# Patient Record
Sex: Female | Born: 1980
Health system: Southern US, Community
[De-identification: ages and names within clinical notes are randomized; demographics above are authoritative.]

## PROBLEM LIST (undated history)

## (undated) DIAGNOSIS — G43909 Migraine, unspecified, not intractable, without status migrainosus: Secondary | ICD-10-CM

## (undated) DIAGNOSIS — N83201 Unspecified ovarian cyst, right side: Secondary | ICD-10-CM

## (undated) DIAGNOSIS — E041 Nontoxic single thyroid nodule: Secondary | ICD-10-CM

## (undated) DIAGNOSIS — K219 Gastro-esophageal reflux disease without esophagitis: Secondary | ICD-10-CM

## (undated) DIAGNOSIS — E669 Obesity, unspecified: Secondary | ICD-10-CM

## (undated) DIAGNOSIS — K560 Paralytic ileus: Secondary | ICD-10-CM

## (undated) DIAGNOSIS — K209 Esophagitis, unspecified without bleeding: Secondary | ICD-10-CM

## (undated) DIAGNOSIS — N390 Urinary tract infection, site not specified: Secondary | ICD-10-CM

## (undated) DIAGNOSIS — F32A Depression, unspecified: Secondary | ICD-10-CM

## (undated) DIAGNOSIS — I499 Cardiac arrhythmia, unspecified: Secondary | ICD-10-CM

## (undated) DIAGNOSIS — F419 Anxiety disorder, unspecified: Secondary | ICD-10-CM

## (undated) DIAGNOSIS — E119 Type 2 diabetes mellitus without complications: Secondary | ICD-10-CM

## (undated) DIAGNOSIS — N200 Calculus of kidney: Secondary | ICD-10-CM

## (undated) DIAGNOSIS — Z765 Malingerer [conscious simulation]: Secondary | ICD-10-CM

## (undated) DIAGNOSIS — T4145XA Adverse effect of unspecified anesthetic, initial encounter: Secondary | ICD-10-CM

## (undated) DIAGNOSIS — K2970 Gastritis, unspecified, without bleeding: Secondary | ICD-10-CM

## (undated) DIAGNOSIS — K56609 Unspecified intestinal obstruction, unspecified as to partial versus complete obstruction: Secondary | ICD-10-CM

## (undated) DIAGNOSIS — T8859XA Other complications of anesthesia, initial encounter: Secondary | ICD-10-CM

## (undated) DIAGNOSIS — H44009 Unspecified purulent endophthalmitis, unspecified eye: Secondary | ICD-10-CM

## (undated) DIAGNOSIS — Z789 Other specified health status: Secondary | ICD-10-CM

## (undated) DIAGNOSIS — F329 Major depressive disorder, single episode, unspecified: Secondary | ICD-10-CM

## (undated) DIAGNOSIS — J189 Pneumonia, unspecified organism: Secondary | ICD-10-CM

## (undated) HISTORY — DX: Esophagitis, unspecified without bleeding: K20.90

## (undated) HISTORY — PX: PILONIDAL CYST EXCISION: SHX744

## (undated) HISTORY — PX: OTHER SURGICAL HISTORY: SHX169

## (undated) HISTORY — DX: Esophagitis, unspecified: K20.9

## (undated) HISTORY — PX: KNEE ARTHROSCOPY: SUR90

## (undated) HISTORY — DX: Calculus of kidney: N20.0

## (undated) HISTORY — DX: Other specified health status: Z78.9

## (undated) HISTORY — PX: COLON SURGERY: SHX602

## (undated) HISTORY — PX: LAPAROSCOPIC GASTRIC BANDING: SHX1100

## (undated) HISTORY — PX: UMBILICAL HERNIA REPAIR: SHX196

## (undated) HISTORY — PX: UPPER GASTROINTESTINAL ENDOSCOPY: SHX188

## (undated) HISTORY — DX: Anxiety disorder, unspecified: F41.9

---
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
## 2001-05-08 HISTORY — PX: CHOLECYSTECTOMY: SHX55

## 2004-03-25 ENCOUNTER — Ambulatory Visit: Payer: Self-pay | Admitting: General Surgery

## 2004-04-11 ENCOUNTER — Ambulatory Visit: Payer: Self-pay | Admitting: Internal Medicine

## 2004-04-13 ENCOUNTER — Observation Stay: Payer: Self-pay | Admitting: Specialist

## 2004-05-08 HISTORY — PX: ABDOMINAL ADHESION SURGERY: SHX90

## 2004-05-08 HISTORY — PX: OTHER SURGICAL HISTORY: SHX169

## 2004-05-27 ENCOUNTER — Emergency Department: Payer: Self-pay | Admitting: Emergency Medicine

## 2004-11-21 ENCOUNTER — Inpatient Hospital Stay: Payer: Self-pay | Admitting: Internal Medicine

## 2005-02-07 ENCOUNTER — Inpatient Hospital Stay: Payer: Self-pay | Admitting: Gastroenterology

## 2005-02-13 ENCOUNTER — Inpatient Hospital Stay: Payer: Self-pay | Admitting: Obstetrics and Gynecology

## 2005-03-10 ENCOUNTER — Observation Stay: Payer: Self-pay | Admitting: Obstetrics and Gynecology

## 2005-03-31 ENCOUNTER — Emergency Department: Payer: Self-pay | Admitting: Emergency Medicine

## 2005-07-11 ENCOUNTER — Emergency Department: Payer: Self-pay | Admitting: Emergency Medicine

## 2006-01-03 ENCOUNTER — Other Ambulatory Visit: Payer: Self-pay

## 2006-01-04 ENCOUNTER — Inpatient Hospital Stay: Payer: Self-pay | Admitting: Internal Medicine

## 2006-01-23 ENCOUNTER — Emergency Department: Payer: Self-pay | Admitting: Emergency Medicine

## 2006-01-30 ENCOUNTER — Ambulatory Visit: Payer: Self-pay | Admitting: Surgery

## 2006-05-14 ENCOUNTER — Ambulatory Visit: Payer: Self-pay

## 2006-05-29 ENCOUNTER — Ambulatory Visit: Payer: Self-pay | Admitting: Specialist

## 2006-05-30 ENCOUNTER — Emergency Department: Payer: Self-pay | Admitting: Emergency Medicine

## 2006-06-08 ENCOUNTER — Ambulatory Visit: Payer: Self-pay

## 2007-07-24 ENCOUNTER — Observation Stay (HOSPITAL_COMMUNITY): Admission: AD | Admit: 2007-07-24 | Discharge: 2007-07-26 | Payer: Self-pay | Admitting: Obstetrics and Gynecology

## 2007-08-01 ENCOUNTER — Ambulatory Visit (HOSPITAL_COMMUNITY): Admission: RE | Admit: 2007-08-01 | Discharge: 2007-08-01 | Payer: Self-pay | Admitting: Obstetrics and Gynecology

## 2007-11-18 ENCOUNTER — Other Ambulatory Visit: Payer: Self-pay

## 2007-11-18 ENCOUNTER — Emergency Department: Payer: Self-pay | Admitting: Emergency Medicine

## 2007-12-10 ENCOUNTER — Emergency Department: Payer: Self-pay | Admitting: Internal Medicine

## 2008-01-14 ENCOUNTER — Other Ambulatory Visit: Payer: Self-pay

## 2008-01-14 ENCOUNTER — Inpatient Hospital Stay: Payer: Self-pay | Admitting: Internal Medicine

## 2008-10-26 ENCOUNTER — Emergency Department: Payer: Self-pay | Admitting: Unknown Physician Specialty

## 2008-10-26 ENCOUNTER — Emergency Department: Payer: Self-pay | Admitting: Emergency Medicine

## 2008-10-27 ENCOUNTER — Emergency Department: Payer: Self-pay | Admitting: Emergency Medicine

## 2008-12-29 ENCOUNTER — Emergency Department: Payer: Self-pay | Admitting: Unknown Physician Specialty

## 2008-12-31 ENCOUNTER — Inpatient Hospital Stay: Payer: Self-pay | Admitting: Internal Medicine

## 2009-03-15 ENCOUNTER — Emergency Department: Payer: Self-pay | Admitting: Emergency Medicine

## 2009-04-12 ENCOUNTER — Emergency Department: Payer: Self-pay | Admitting: Emergency Medicine

## 2009-04-16 ENCOUNTER — Inpatient Hospital Stay: Payer: Self-pay | Admitting: Internal Medicine

## 2009-05-31 ENCOUNTER — Encounter: Admission: RE | Admit: 2009-05-31 | Discharge: 2009-05-31 | Payer: Self-pay | Admitting: Family Medicine

## 2009-06-03 ENCOUNTER — Emergency Department (HOSPITAL_COMMUNITY): Admission: EM | Admit: 2009-06-03 | Discharge: 2009-06-03 | Payer: Self-pay | Admitting: Emergency Medicine

## 2009-07-12 ENCOUNTER — Observation Stay (HOSPITAL_COMMUNITY): Admission: EM | Admit: 2009-07-12 | Discharge: 2009-07-15 | Payer: Self-pay | Admitting: Emergency Medicine

## 2009-12-23 ENCOUNTER — Emergency Department (HOSPITAL_COMMUNITY): Admission: EM | Admit: 2009-12-23 | Discharge: 2009-12-23 | Payer: Self-pay | Admitting: Emergency Medicine

## 2010-01-25 ENCOUNTER — Emergency Department (HOSPITAL_COMMUNITY): Admission: EM | Admit: 2010-01-25 | Discharge: 2010-01-25 | Payer: Self-pay | Admitting: Emergency Medicine

## 2010-02-02 ENCOUNTER — Emergency Department (HOSPITAL_COMMUNITY): Admission: EM | Admit: 2010-02-02 | Discharge: 2010-02-02 | Payer: Self-pay | Admitting: Emergency Medicine

## 2010-04-23 ENCOUNTER — Emergency Department: Payer: Self-pay | Admitting: Emergency Medicine

## 2010-04-25 ENCOUNTER — Emergency Department: Payer: Self-pay | Admitting: Emergency Medicine

## 2010-05-18 ENCOUNTER — Emergency Department: Payer: Self-pay | Admitting: Emergency Medicine

## 2010-07-04 ENCOUNTER — Ambulatory Visit (INDEPENDENT_AMBULATORY_CARE_PROVIDER_SITE_OTHER): Payer: Self-pay | Admitting: Internal Medicine

## 2010-07-21 LAB — URINALYSIS, ROUTINE W REFLEX MICROSCOPIC
Ketones, ur: NEGATIVE mg/dL
Nitrite: NEGATIVE
Protein, ur: NEGATIVE mg/dL
Urobilinogen, UA: 1 mg/dL (ref 0.0–1.0)
pH: 6.5 (ref 5.0–8.0)

## 2010-07-21 LAB — DIFFERENTIAL
Basophils Absolute: 0.1 10*3/uL (ref 0.0–0.1)
Basophils Relative: 1 % (ref 0–1)
Eosinophils Absolute: 0.2 10*3/uL (ref 0.0–0.7)
Eosinophils Relative: 2 % (ref 0–5)
Monocytes Absolute: 0.7 10*3/uL (ref 0.1–1.0)
Monocytes Relative: 7 % (ref 3–12)
Neutrophils Relative %: 70 % (ref 43–77)

## 2010-07-21 LAB — COMPREHENSIVE METABOLIC PANEL
ALT: 27 U/L (ref 0–35)
BUN: 8 mg/dL (ref 6–23)
Chloride: 107 mEq/L (ref 96–112)
Creatinine, Ser: 0.69 mg/dL (ref 0.4–1.2)
GFR calc non Af Amer: >60 mL/min (ref >60)
Glucose, Bld: 169 mg/dL — ABNORMAL HIGH (ref 70–99)
Potassium: 3.5 mEq/L (ref 3.5–5.1)

## 2010-07-21 LAB — CBC
HCT: 41.8 % (ref 36.0–46.0)
MCH: 32.3 pg (ref 26.0–34.0)
MCV: 93.4 fL (ref 78.0–100.0)
Platelets: 394 10*3/uL (ref 150–400)
RBC: 4.47 MIL/uL (ref 3.87–5.11)
RDW: 13.4 % (ref 11.5–15.5)
WBC: 10.6 10*3/uL — ABNORMAL HIGH (ref 4.0–10.5)

## 2010-07-21 LAB — URINE MICROSCOPIC-ADD ON

## 2010-07-22 LAB — URINE MICROSCOPIC-ADD ON

## 2010-07-22 LAB — URINALYSIS, ROUTINE W REFLEX MICROSCOPIC
Hgb urine dipstick: NEGATIVE
Specific Gravity, Urine: 1.034 — ABNORMAL HIGH (ref 1.005–1.030)

## 2010-07-22 LAB — LITHIUM LEVEL: Lithium Lvl: 0.29 mEq/L — ABNORMAL LOW (ref 0.80–1.40)

## 2010-07-22 LAB — POCT I-STAT, CHEM 8
BUN: 15 mg/dL (ref 6–23)
HCT: 43 % (ref 36.0–46.0)
Hemoglobin: 14.6 g/dL (ref 12.0–15.0)
Sodium: 140 mEq/L (ref 135–145)
TCO2: 23 mmol/L (ref 0–100)

## 2010-07-22 LAB — CBC
HCT: 41.2 % (ref 36.0–46.0)
Hemoglobin: 14.4 g/dL (ref 12.0–15.0)
MCH: 32 pg (ref 26.0–34.0)
MCV: 91.6 fL (ref 78.0–100.0)
RBC: 4.5 MIL/uL (ref 3.87–5.11)
WBC: 11.6 10*3/uL — ABNORMAL HIGH (ref 4.0–10.5)

## 2010-07-22 LAB — HEPATIC FUNCTION PANEL
AST: 20 U/L (ref 0–37)
Albumin: 3.9 g/dL (ref 3.5–5.2)
Alkaline Phosphatase: 77 U/L (ref 39–117)
Bilirubin, Direct: 0.1 mg/dL (ref 0.0–0.3)
Indirect Bilirubin: 0.8 mg/dL (ref 0.3–0.9)
Total Protein: 7 g/dL (ref 6.0–8.3)

## 2010-07-22 LAB — DIFFERENTIAL
Basophils Absolute: 0 10*3/uL (ref 0.0–0.1)
Eosinophils Absolute: 0.2 10*3/uL (ref 0.0–0.7)
Eosinophils Relative: 2 % (ref 0–5)
Lymphs Abs: 2.1 10*3/uL (ref 0.7–4.0)
Monocytes Absolute: 0.7 10*3/uL (ref 0.1–1.0)
Neutrophils Relative %: 74 % (ref 43–77)

## 2010-07-22 LAB — POCT PREGNANCY, URINE: Preg Test, Ur: NEGATIVE

## 2010-07-25 LAB — URINE CULTURE

## 2010-07-25 LAB — URINALYSIS, ROUTINE W REFLEX MICROSCOPIC
Bilirubin Urine: NEGATIVE
Specific Gravity, Urine: 1.015 (ref 1.005–1.030)
Urobilinogen, UA: 0.2 mg/dL (ref 0.0–1.0)

## 2010-07-25 LAB — URINE MICROSCOPIC-ADD ON

## 2010-07-25 LAB — POCT PREGNANCY, URINE: Preg Test, Ur: NEGATIVE

## 2010-08-01 LAB — URINE MICROSCOPIC-ADD ON

## 2010-08-01 LAB — BASIC METABOLIC PANEL
BUN: 4 mg/dL — ABNORMAL LOW (ref 6–23)
CO2: 26 mEq/L (ref 19–32)
Calcium: 8.5 mg/dL (ref 8.4–10.5)
Chloride: 107 mEq/L (ref 96–112)
Creatinine, Ser: 0.53 mg/dL (ref 0.4–1.2)
GFR calc Af Amer: >60 mL/min (ref >60)
Glucose, Bld: 114 mg/dL — ABNORMAL HIGH (ref 70–99)

## 2010-08-01 LAB — URINALYSIS, ROUTINE W REFLEX MICROSCOPIC
Glucose, UA: NEGATIVE mg/dL
Hgb urine dipstick: NEGATIVE
Specific Gravity, Urine: 1.021 (ref 1.005–1.030)
pH: 8 (ref 5.0–8.0)

## 2010-08-01 LAB — COMPREHENSIVE METABOLIC PANEL
ALT: 28 U/L (ref 0–35)
AST: 20 U/L (ref 0–37)
Albumin: 3.1 g/dL — ABNORMAL LOW (ref 3.5–5.2)
Albumin: 3.3 g/dL — ABNORMAL LOW (ref 3.5–5.2)
Albumin: 3.6 g/dL (ref 3.5–5.2)
Alkaline Phosphatase: 67 U/L (ref 39–117)
Alkaline Phosphatase: 73 U/L (ref 39–117)
Alkaline Phosphatase: 78 U/L (ref 39–117)
BUN: 2 mg/dL — ABNORMAL LOW (ref 6–23)
BUN: 4 mg/dL — ABNORMAL LOW (ref 6–23)
Calcium: 8.2 mg/dL — ABNORMAL LOW (ref 8.4–10.5)
Calcium: 8.8 mg/dL (ref 8.4–10.5)
Creatinine, Ser: 0.57 mg/dL (ref 0.4–1.2)
Creatinine, Ser: 0.57 mg/dL (ref 0.4–1.2)
GFR calc Af Amer: >60 mL/min (ref >60)
Glucose, Bld: 120 mg/dL — ABNORMAL HIGH (ref 70–99)
Potassium: 3.6 mEq/L (ref 3.5–5.1)
Potassium: 3.8 mEq/L (ref 3.5–5.1)
Potassium: 3.8 mEq/L (ref 3.5–5.1)
Sodium: 138 mEq/L (ref 135–145)
Total Protein: 5.9 g/dL — ABNORMAL LOW (ref 6.0–8.3)
Total Protein: 6.1 g/dL (ref 6.0–8.3)
Total Protein: 6.6 g/dL (ref 6.0–8.3)

## 2010-08-01 LAB — DIFFERENTIAL
Basophils Absolute: 0 10*3/uL (ref 0.0–0.1)
Basophils Relative: 0 % (ref 0–1)
Basophils Relative: 1 % (ref 0–1)
Eosinophils Absolute: 0.3 10*3/uL (ref 0.0–0.7)
Eosinophils Relative: 2 % (ref 0–5)
Lymphs Abs: 2.8 10*3/uL (ref 0.7–4.0)
Monocytes Absolute: 0.7 10*3/uL (ref 0.1–1.0)
Monocytes Absolute: 0.9 10*3/uL (ref 0.1–1.0)
Monocytes Relative: 9 % (ref 3–12)
Neutro Abs: 5.8 10*3/uL (ref 1.7–7.7)
Neutro Abs: 8.6 10*3/uL — ABNORMAL HIGH (ref 1.7–7.7)

## 2010-08-01 LAB — WET PREP, GENITAL
Clue Cells Wet Prep HPF POC: NONE SEEN
Trich, Wet Prep: NONE SEEN

## 2010-08-01 LAB — POCT PREGNANCY, URINE: Preg Test, Ur: NEGATIVE

## 2010-08-01 LAB — CBC
HCT: 36.1 % (ref 36.0–46.0)
HCT: 37.7 % (ref 36.0–46.0)
Hemoglobin: 12.3 g/dL (ref 12.0–15.0)
MCHC: 34.2 g/dL (ref 30.0–36.0)
MCHC: 34.7 g/dL (ref 30.0–36.0)
MCV: 93.1 fL (ref 78.0–100.0)
Platelets: 257 10*3/uL (ref 150–400)
Platelets: 319 10*3/uL (ref 150–400)
Platelets: 345 10*3/uL (ref 150–400)
RDW: 13.1 % (ref 11.5–15.5)
RDW: 13.2 % (ref 11.5–15.5)
RDW: 13.4 % (ref 11.5–15.5)
WBC: 11.2 10*3/uL — ABNORMAL HIGH (ref 4.0–10.5)

## 2010-08-01 LAB — HEMOGLOBIN AND HEMATOCRIT, BLOOD
HCT: 37.5 % (ref 36.0–46.0)
HCT: 38.6 % (ref 36.0–46.0)
HCT: 42 % (ref 36.0–46.0)
Hemoglobin: 13.1 g/dL (ref 12.0–15.0)

## 2010-08-01 LAB — GC/CHLAMYDIA PROBE AMP, GENITAL
Chlamydia, DNA Probe: NEGATIVE
GC Probe Amp, Genital: NEGATIVE

## 2010-08-01 LAB — LIPASE, BLOOD: Lipase: 18 U/L (ref 11–59)

## 2010-09-08 ENCOUNTER — Emergency Department (HOSPITAL_COMMUNITY)
Admission: EM | Admit: 2010-09-08 | Discharge: 2010-09-08 | Disposition: A | Discharge status: Home / Self Care | Payer: BC Managed Care – PPO | Attending: Emergency Medicine | Admitting: Emergency Medicine

## 2010-09-08 ENCOUNTER — Encounter (HOSPITAL_COMMUNITY): Payer: Self-pay | Admitting: Radiology

## 2010-09-08 ENCOUNTER — Emergency Department (HOSPITAL_COMMUNITY): Payer: BC Managed Care – PPO

## 2010-09-08 DIAGNOSIS — R109 Unspecified abdominal pain: Secondary | ICD-10-CM | POA: Insufficient documentation

## 2010-09-08 DIAGNOSIS — N949 Unspecified condition associated with female genital organs and menstrual cycle: Secondary | ICD-10-CM | POA: Insufficient documentation

## 2010-09-08 DIAGNOSIS — Z9089 Acquired absence of other organs: Secondary | ICD-10-CM | POA: Insufficient documentation

## 2010-09-08 DIAGNOSIS — Z87442 Personal history of urinary calculi: Secondary | ICD-10-CM | POA: Insufficient documentation

## 2010-09-08 DIAGNOSIS — N938 Other specified abnormal uterine and vaginal bleeding: Secondary | ICD-10-CM | POA: Insufficient documentation

## 2010-09-08 DIAGNOSIS — N83209 Unspecified ovarian cyst, unspecified side: Secondary | ICD-10-CM | POA: Insufficient documentation

## 2010-09-08 DIAGNOSIS — R112 Nausea with vomiting, unspecified: Secondary | ICD-10-CM | POA: Insufficient documentation

## 2010-09-08 LAB — URINALYSIS, ROUTINE W REFLEX MICROSCOPIC
Bilirubin Urine: NEGATIVE
Glucose, UA: NEGATIVE mg/dL
Specific Gravity, Urine: 1.024 (ref 1.005–1.030)
pH: 6 (ref 5.0–8.0)

## 2010-09-08 LAB — LIPASE, BLOOD: Lipase: 28 U/L (ref 11–59)

## 2010-09-08 LAB — CBC
Hemoglobin: 14.4 g/dL (ref 12.0–15.0)
MCHC: 34.6 g/dL (ref 30.0–36.0)
Platelets: 367 10*3/uL (ref 150–400)
RBC: 4.65 MIL/uL (ref 3.87–5.11)

## 2010-09-08 LAB — WET PREP, GENITAL
Trich, Wet Prep: NONE SEEN
Yeast Wet Prep HPF POC: NONE SEEN

## 2010-09-08 LAB — URINE MICROSCOPIC-ADD ON

## 2010-09-08 LAB — DIFFERENTIAL
Basophils Absolute: 0 10*3/uL (ref 0.0–0.1)
Basophils Relative: 0 % (ref 0–1)
Neutro Abs: 6 10*3/uL (ref 1.7–7.7)
Neutrophils Relative %: 70 % (ref 43–77)

## 2010-09-08 MED ORDER — IOHEXOL 300 MG/ML  SOLN
100.0000 mL | Freq: Once | INTRAMUSCULAR | Status: AC | PRN
  Administered 2010-09-08: 100 mL via INTRAVENOUS

## 2010-09-09 LAB — POCT I-STAT, CHEM 8
BUN: 8 mg/dL (ref 6–23)
Creatinine, Ser: 0.7 mg/dL (ref 0.4–1.2)
Glucose, Bld: 167 mg/dL — ABNORMAL HIGH (ref 70–99)
Hemoglobin: 15 g/dL (ref 12.0–15.0)
Potassium: 3.3 mEq/L — ABNORMAL LOW (ref 3.5–5.1)

## 2010-09-15 ENCOUNTER — Inpatient Hospital Stay (HOSPITAL_COMMUNITY)
Admission: AD | Admit: 2010-09-15 | Discharge: 2010-09-18 | DRG: 189 | Disposition: A | Discharge status: Home / Self Care | Payer: BC Managed Care – PPO | Source: Other Acute Inpatient Hospital | Attending: Internal Medicine | Admitting: Internal Medicine

## 2010-09-15 ENCOUNTER — Inpatient Hospital Stay (HOSPITAL_COMMUNITY)
Admission: AD | Admit: 2010-09-15 | Discharge: 2010-09-15 | Disposition: A | Discharge status: Skilled Nursing Facility | Payer: BC Managed Care – PPO | Source: Ambulatory Visit | Attending: Obstetrics and Gynecology | Admitting: Obstetrics and Gynecology

## 2010-09-15 DIAGNOSIS — F341 Dysthymic disorder: Secondary | ICD-10-CM | POA: Diagnosis present

## 2010-09-15 DIAGNOSIS — R109 Unspecified abdominal pain: Secondary | ICD-10-CM | POA: Insufficient documentation

## 2010-09-15 DIAGNOSIS — N809 Endometriosis, unspecified: Secondary | ICD-10-CM | POA: Diagnosis present

## 2010-09-15 DIAGNOSIS — N949 Unspecified condition associated with female genital organs and menstrual cycle: Secondary | ICD-10-CM | POA: Diagnosis present

## 2010-09-15 DIAGNOSIS — K660 Peritoneal adhesions (postprocedural) (postinfection): Principal | ICD-10-CM | POA: Diagnosis present

## 2010-09-15 LAB — URINALYSIS, ROUTINE W REFLEX MICROSCOPIC
Glucose, UA: NEGATIVE mg/dL
Hgb urine dipstick: NEGATIVE
Protein, ur: NEGATIVE mg/dL
Specific Gravity, Urine: >1.03 — ABNORMAL HIGH (ref 1.005–1.030)
pH: 5.5 (ref 5.0–8.0)

## 2010-09-15 LAB — COMPREHENSIVE METABOLIC PANEL
AST: 37 U/L (ref 0–37)
Albumin: 3.4 g/dL — ABNORMAL LOW (ref 3.5–5.2)
Alkaline Phosphatase: 81 U/L (ref 39–117)
BUN: 9 mg/dL (ref 6–23)
Chloride: 101 mEq/L (ref 96–112)
GFR calc Af Amer: >60 mL/min (ref >60)
Potassium: 4.2 mEq/L (ref 3.5–5.1)
Total Bilirubin: 0.3 mg/dL (ref 0.3–1.2)
Total Protein: 6.4 g/dL (ref 6.0–8.3)

## 2010-09-15 LAB — DIFFERENTIAL
Basophils Absolute: 0.1 10*3/uL (ref 0.0–0.1)
Basophils Relative: 1 % (ref 0–1)
Eosinophils Absolute: 0.2 10*3/uL (ref 0.0–0.7)
Eosinophils Relative: 3 % (ref 0–5)
Neutrophils Relative %: 55 % (ref 43–77)

## 2010-09-15 LAB — CBC
Platelets: 310 10*3/uL (ref 150–400)
RBC: 4.47 MIL/uL (ref 3.87–5.11)
RDW: 13.1 % (ref 11.5–15.5)
WBC: 7.7 10*3/uL (ref 4.0–10.5)

## 2010-09-15 LAB — URINE MICROSCOPIC-ADD ON

## 2010-09-16 LAB — CBC
HCT: 36.4 % (ref 36.0–46.0)
MCH: 31.2 pg (ref 26.0–34.0)
MCV: 89.4 fL (ref 78.0–100.0)
Platelets: 291 10*3/uL (ref 150–400)
RBC: 4.07 MIL/uL (ref 3.87–5.11)
WBC: 5.9 10*3/uL (ref 4.0–10.5)

## 2010-09-16 LAB — URINALYSIS, ROUTINE W REFLEX MICROSCOPIC
Bilirubin Urine: NEGATIVE
Glucose, UA: NEGATIVE mg/dL
Hgb urine dipstick: NEGATIVE
Protein, ur: NEGATIVE mg/dL
Specific Gravity, Urine: 1.009 (ref 1.005–1.030)
Urobilinogen, UA: 1 mg/dL (ref 0.0–1.0)

## 2010-09-16 LAB — URINE CULTURE
Colony Count: NO GROWTH
Culture: NO GROWTH

## 2010-09-16 LAB — BASIC METABOLIC PANEL
BUN: 7 mg/dL (ref 6–23)
CO2: 26 mEq/L (ref 19–32)
Chloride: 105 mEq/L (ref 96–112)
Creatinine, Ser: 0.49 mg/dL (ref 0.4–1.2)
Glucose, Bld: 107 mg/dL — ABNORMAL HIGH (ref 70–99)
Potassium: 3.8 mEq/L (ref 3.5–5.1)

## 2010-09-20 NOTE — Op Note (Signed)
NAME:  [name], [name] NO.:  [id number]   MEDICAL RECORD NO.:  [id number]          PATIENT TYPE:  AMB   LOCATION:  SDC                           FACILITY:  [facility]   PHYSICIAN:  Dineen Kid. Rana Snare, M.D.    DATE OF BIRTH:  [birth year]   DATE OF PROCEDURE:  08/01/2007  DATE OF DISCHARGE:                               OPERATIVE REPORT   PREOPERATIVE DIAGNOSES:  1. Pelvic pain.  2. History pelvic adhesions.  3. History of endometriosis.   POSTOPERATIVE DIAGNOSES:  1. Pelvic pain.  2. History pelvic adhesions.  3. History of endometriosis.  4. Severe anterior abdominal wall-bowel adhesions.   PROCEDURE:  Diagnostic laparoscopy.   SURGEON:  Dineen Kid. Rana Snare, M.D.   ANESTHESIA:  General endotracheal.   INDICATIONS:  [name] is a 30 year old with a very complicated past  history, includes history of ruptured right ovarian cyst and pelvic  adhesions treated by an OB/GYN from [facility] with a laparoscopy which  he was unable to complete the laparoscopy due to pelvic adhesions and  proceeded with a laparotomy, apparently had lysis of adhesions and  removed the right ovary at that time.  She did get good pain relief for  approximately 1 year, however did require reoperative placement of mesh  for umbilical hernia.  The patient presents to me after getting  frustrated with worsening pelvic pain and wanting surgical evaluation of  this.  She has had pain management problems and was hospitalized last  week for pain management and is currently on fentanyl patch and  phenergan.  We had a lengthy discussion about realistic treatment  options and findings at the time of surgery.  I discussed proceeding  with an open laparoscopy and if able to achieve adequate visualization  of the pelvis would aggressively treat endometriosis or adhesions in the  pelvis; however, if significant bowel adhesions, we would abandon the  procedure and send her for further evaluation and treatment by  either a  general surgeon or a GYN oncologist.  Other risks of the procedure were  discussed including risk of infection; bleeding; damage to the uterus,  tubes, ovaries, bowel or bladder; the possibility it may not alleviate  the pain, it could recur or worsen; risks associated with anesthesia;  blood transfusion.  She was also prepared for a laparotomy if necessary.  She gives her informed consent.   FINDINGS AT THE TIME OF SURGERY:  Severe anterior abdominal wall bowel  adhesions from the area of the trocar insertion towards the pelvis with  enough dense adhesions that I was unable to adequately visualize the  pelvis.  Small bowel and omentum were densely adhered from the midline  incision all the way down into the pelvis.  Anteriorly, similarly there  were omental adhesions to the anterior abdominal wall.  Unable to  adequately visualize the liver.   DESCRIPTION OF PROCEDURE:  After adequate analgesia, the patient placed  in the dorsal lithotomy position.  She is sterilely prepped and draped,  bladder is sterilely drained.  A Hulka tenaculum is placed on the  cervix.  Due  to her previous surgeries, after sterile prep and drape it  was elected to proceed approximately 3-4 cm below her umbilicus through  her midline incision in a vertical fashion.  An approximately 3-cm  incision was made.  It was carried down sharply to the fascia.  The  fascia was elevated, nicked and incised, extended superiorly and  inferiorly.  Sutures of 0 Vicryl were placed through the fascia.  The  peritoneum was grasped and entered sharply.  A Hasson trocar was  inserted.  The abdomen was then insufflated and laparoscope was inserted  and the above findings.  After careful evaluation of the abdomen and no  realistic chance of being able to lyse or remove these adhesions to  adequately visualize the pelvis, the second option of laparotomy also  seemed futile at this point without the assistance of a general  surgeon  or oncologist due to the nature and the severity of the bowel adhesions,  and it was elected to abandon the procedure and refer her for further  evaluation by either a general surgeon or oncologist.  The [facility] trocar  was then removed.  The abdomen was desufflated.  The fascia was closed  with 0 Vicryl sutures with good approximation of the fascia noted.  Hemostasis was achieved.  The skin was then closed with a 3-0 Vicryl  Rapide subcuticular suture.  The incision was then injected with 0.25%  Marcaine, a total of 10 mL used.  The tenaculum was removed from the  cervix.  The patient was then transferred to the recovery room in stable  condition.  Sponge, needle and instrument count was normal x3.  Estimated blood loss was minimal.  The patient did receive 1 g of  cefotetan preoperatively and 30 mg of Toradol IV postoperatively.   DISPOSITION:  The patient will be discharged home and will follow up in  the office in 2-3 weeks.  We will go ahead and refer her for further  evaluation.  She has been requiring fentanyl patches and oxycodone for  pain relief and we will continue this in the interim.  She knows to  return for increased pain, fever or bleeding.      Dineen Kid Rana Snare, M.D.  Electronically Signed     DCL/MEDQ  D:  08/01/2007  T:  08/01/2007  Job:  [id number]

## 2010-09-20 NOTE — Consult Note (Signed)
NAME:  Loretta Alexander, Loretta Alexander NO.:  [id number]  MEDICAL RECORD NO.:  [id number]           PATIENT TYPE:  I  LOCATION:  5532                         FACILITY:  [facility]  PHYSICIAN:  Lunden Mcleish A. Shanon Becvar, M.D.DATE OF BIRTH:  [birth year]  DATE OF CONSULTATION:  09/15/2010 DATE OF DISCHARGE:                                CONSULTATION   REQUESTING PHYSICIAN:  Conley Canal, MD  REASON FOR CONSULTATION:  Abdominal pain.  HISTORY OF PRESENT ILLNESS:  [name] is a 30 year old female who we have seen before in the past for similar symptoms.  She has a history of irritable bowel syndrome.  She has a history of endometriosis, generalized anxiety, and depressive border.  She has a history of chronic pelvic pain secondary to presumed pelvic adhesions.  She has had multiple laparoscopies in the past for lysis of adhesions, most recently in 2009 by Dr. Candice Alexander.  She continues to see him for a chronic pelvic pain and nonsurgical management what she has been treated for, ongoing.  She presented with an exacerbation of chronic pelvic pain and has been admitted for pain control.  She describes that she is in the process of trying to get to see a Loretta Alexander specializing in pelvic pain.  She denies any fever.  She denies any nausea or vomiting.  She states her appetite is diminished.  She has otherwise had normal bowel movements.  She was seen about a week ago a Loretta Alexander with similar symptoms.  Her CT scan showed no acute process there.  Her labs were within normal limits.  We have been asked to consult on the patient here for abdominal pain of uncertain etiology.  PAST MEDICAL HISTORY:  As mentioned in the history of present illness as well as history of prior gastritis, obesity, as well as migraines.  PAST SURGICAL HISTORY:  The patient has had a cholecystectomy before. She has had a lap band surgery with subsequent removal due to failed treatment.  She has  also had an appendectomy.  She also tells me she has had a right oophorectomy though there is mention on her CT scan that she has a right ovarian cyst.  FAMILY HISTORY:  Noncontributory at present case.  SOCIAL HISTORY:  Denies tobacco, alcohol, or illicit drug use.  MEDICATIONS:  As noted in medication reconciliation.  Allergies include COMPAZINE, TORADOL, and REGLAN.  REVIEW OF SYSTEMS:  Please see history of present illness for pertinent findings, otherwise complete 12-system review found negative.  PHYSICAL EXAMINATION:  GENERAL:  This is a 30 year old morbidly obese female who does appear in mild discomfort at present time but nontoxic appearing. CURRENT VITAL SIGNS:  Temperature of 98.3, heart rate of 81, blood pressure 100/64, oxygen saturation 95% to 98% on room air. ENT:  Unremarkable. NECK:  Supple without lymphadenopathy. LUNGS:  Clear to auscultation.  No wheezes, rhonchi, or rales.  Normal respiratory effort without use of accessory muscles. HEART:  Regular rate and rhythm with no murmurs, gallops, or rubs. Carotids 2+ and brisk without bruits.  Peripheral pulses intact and symmetrical. ABDOMEN:  Multiple surgical scars  are seen.  She is nondistended.  She is diffusely tender across the lower abdomen without point tenderness. No rebound or guarding is appreciated.  No signs of peritonitis.  Bowel sounds are active.  DIAGNOSTICS:  Labs performed today show white blood cell count of 7.7, hemoglobin of 13.5, hematocrit of 40.8, platelet count of 310. Metabolic panel shows a sodium of 137, potassium of 4.2, chloride of 101, CO2 of 27, BUN of 9, creatinine of 0.6, glucose of 107.  Liver enzymes within normal limits.  CT scan from 1 week ago again shows evidence of prior cholecystectomy, evidence of appendectomy.  No evidence of any inflammatory process. Appears to be a 3-cm cyst in the right ovary.  Uterus and left ovary were unremarkable.  No evidence of  hydronephrosis or renal calculi.  IMPRESSION: 1. Chronic pelvic pain, presumably secondary to adhesions. 2. History of endometriosis. 3. Generalized anxiety and depressive disorder.  RECOMMENDATIONS:  This patient has chronic symptoms that are uncomplicated by any acute process or an inflammatory reaction in her pelvis based on her CT from 1 week ago.  Her labs are within normal limits.  We do not suspect there is an acute process going on in the patient's abdomen.  We have little to offer her here from a surgical standpoint, but not generally recommend operating on adhesions solely for pain unless complicating features are present.  We believe the patient is probably best served following up with her primary Loretta and/or referral to the specialist she is supposedly scheduled with at [facility].  We would not expect that the patient would likely be accepted in transfer due to a lack of acuity in her case.  Dr. Luisa Hart will see and review.     Brayton El, PA-C   ______________________________ Clovis Pu Trinia Georgi, M.D.    Corky Downs  D:  09/15/2010  T:  09/16/2010  Job:  [id number]  cc:   Conley Canal, MD  Electronically Signed by Brayton El  on 09/20/2010 01:25:27 PM Electronically Signed by Harriette Bouillon M.D. on 09/20/2010 01:47:41 PM

## 2010-09-21 NOTE — Discharge Summary (Signed)
  Loretta Alexander, Loretta Alexander NO.:  [id number]  MEDICAL RECORD NO.:  [id number]           PATIENT TYPE:  I  LOCATION:  5532                         FACILITY:  [facility]  PHYSICIAN:  Conley Canal, MD      DATE OF BIRTH:  10-22-1980  DATE OF ADMISSION:  09/15/2010 DATE OF DISCHARGE:                        DISCHARGE SUMMARY - REFERRING   PRIMARY CARE PHYSICIAN:  Tally Joe, M.D.  GYNECOLOGIST:  Dineen Kid. Rana Snare, M.D.  GENERAL SURGEON:  Dr. Freddy Finner, [facility].  CONSULTING PHYSICIANS:  Maisie Fus A. Cornett, M.D.  DISCHARGE DIAGNOSES: 1. Possible peritoneal adhesions with resultant intractable abdominal     pain, nausea, vomiting, status post adhesiolysis at [facility] in     2008. 2. Endometriosis. 3. Status post cholecystectomy. 4. History of migraine headaches. 5. History of kidney stones. 6. Morbid obesity, body mass index greater than 40.  DISCHARGE MEDICATIONS: 1. Percocet 5/325 mg 1-2 tablets every 4 hours as needed, 30 tablets     given. 2. Promethazine 12.5 mg every 6 hourly as needed. 3. Cymbalta 60 mg daily. 4. Propranolol 20 mg daily.  PROCEDURES PERFORMED:  None.  HOSPITAL COURSE:  [name] is a pleasant 30 year old female referred from [facility] by Dr. Candice Camp for recurrent abdominal and pelvic pain associated with nausea and vomiting, thought to be related to adhesions.  She was referred to the medical service for control of her pain and nausea and vomiting while awaiting transfer to [facility] where she has had adhesiolysis previously.  She has had a CT abdomen and pelvis on May 3 which showed a 2.9-cm right ovarian cyst with no inflammatory process, hence the concern for adhesions giving rise to the pain.  Upon admission to the hospitalist service, we consulted Dr. Harriette Bouillon with General Surgery who felt that the patient would be best served by [facility] where she was followed previously.  We called [facility], and we were able to arrange for the patient to be seen by a Dr. Freddy Finner on May 14 in the outpatient clinic.  The plan is to discharge her today and have her mom try for her to the clinic tomorrow.  Otherwise, her labs including CBC, electrolytes, urinalysis were unremarkable.  She is discharged in stable condition.     Conley Canal, MD     SR/MEDQ  D:  09/18/2010  T:  09/18/2010  Job:  [id number]  cc:   Dineen Kid. Rana Snare, M.D. Thomas A. Cornett, M.D. Tally Joe, M.D. Dr. Freddy Finner  Electronically Signed by Conley Canal  on 09/21/2010 03:40:38 AM

## 2010-10-10 ENCOUNTER — Emergency Department: Payer: Self-pay | Admitting: Emergency Medicine

## 2010-10-14 ENCOUNTER — Emergency Department (HOSPITAL_COMMUNITY)
Admission: EM | Admit: 2010-10-14 | Discharge: 2010-10-14 | Disposition: A | Discharge status: Home / Self Care | Payer: BC Managed Care – PPO | Attending: Emergency Medicine | Admitting: Emergency Medicine

## 2010-10-14 ENCOUNTER — Emergency Department (HOSPITAL_COMMUNITY): Payer: BC Managed Care – PPO

## 2010-10-14 DIAGNOSIS — G8929 Other chronic pain: Secondary | ICD-10-CM | POA: Insufficient documentation

## 2010-10-14 DIAGNOSIS — R109 Unspecified abdominal pain: Secondary | ICD-10-CM | POA: Insufficient documentation

## 2010-10-14 DIAGNOSIS — R112 Nausea with vomiting, unspecified: Secondary | ICD-10-CM | POA: Insufficient documentation

## 2010-10-14 DIAGNOSIS — Z87442 Personal history of urinary calculi: Secondary | ICD-10-CM | POA: Insufficient documentation

## 2010-10-14 LAB — COMPREHENSIVE METABOLIC PANEL
ALT: 70 U/L — ABNORMAL HIGH (ref 0–35)
AST: 32 U/L (ref 0–37)
Alkaline Phosphatase: 83 U/L (ref 39–117)
CO2: 20 mEq/L (ref 19–32)
Calcium: 9 mg/dL (ref 8.4–10.5)
Potassium: 3.9 mEq/L (ref 3.5–5.1)
Sodium: 133 mEq/L — ABNORMAL LOW (ref 135–145)
Total Protein: 6.5 g/dL (ref 6.0–8.3)

## 2010-10-14 LAB — URINALYSIS, ROUTINE W REFLEX MICROSCOPIC
Hgb urine dipstick: NEGATIVE
Nitrite: NEGATIVE
Protein, ur: NEGATIVE mg/dL
Specific Gravity, Urine: 1.027 (ref 1.005–1.030)
Urobilinogen, UA: 0.2 mg/dL (ref 0.0–1.0)

## 2010-10-14 LAB — DIFFERENTIAL
Basophils Absolute: 0 10*3/uL (ref 0.0–0.1)
Basophils Relative: 0 % (ref 0–1)
Eosinophils Absolute: 0.2 10*3/uL (ref 0.0–0.7)
Monocytes Relative: 6 % (ref 3–12)
Neutro Abs: 11.3 10*3/uL — ABNORMAL HIGH (ref 1.7–7.7)
Neutrophils Relative %: 81 % — ABNORMAL HIGH (ref 43–77)

## 2010-10-14 LAB — URINE MICROSCOPIC-ADD ON

## 2010-10-14 LAB — CBC
Hemoglobin: 13.2 g/dL (ref 12.0–15.0)
MCH: 31.3 pg (ref 26.0–34.0)
MCHC: 35.7 g/dL (ref 30.0–36.0)
Platelets: 296 10*3/uL (ref 150–400)
RBC: 4.22 MIL/uL (ref 3.87–5.11)

## 2010-11-17 NOTE — H&P (Signed)
NAMECARMELITE, Loretta Alexander NO.:  [id number]  MEDICAL RECORD NO.:  [id number]           PATIENT TYPE:  I  LOCATION:  5532                         FACILITY:  [facility]  PHYSICIAN:  Conley Canal, MD      DATE OF BIRTH:  09/01/1980  DATE OF ADMISSION:  09/15/2010 DATE OF DISCHARGE:                             HISTORY & PHYSICAL   PRIMARY CARE PHYSICIAN:  Tally Joe, MD  GYNECOLOGIST:  Dineen Kid. Rana Snare, MD  CHIEF COMPLAINT:  Abdominal pain, nausea, vomiting.  HISTORY OF PRESENT ILLNESS:  [name] is a 30 year old female with history of chronic pelvic pain, status post lysis of adhesions in 2008, endometriosis and gastric banding, status post removal who presents to [facility] today with complaints of abdominal pain, nausea and vomiting.  Of note, the patient has previously been referred by her gynecologist to [facility] for chronic pelvic pain issues.  The patient has yet to follow up at [facility].  She does state she had her lysis of adhesions done in 2008 at [facility].  The patient was also seen to [facility] on Sep 08, 2010 with similar complaints. CT of the abdomen and pelvis done at that time revealed right ovarian cyst, otherwise unremarkable.  The patient reports intermittent vaginal bleeding for approximately 3 days with lower right-sided abdominal pain, nausea and vomiting.  The patient denies any recent fever.  No diarrhea, constipation, melena or hematochezia.  The patient has been admitted by [facility] hospitalist for further evaluation and treatment.  PAST MEDICAL HISTORY: 1. History of chronic pelvic pain, status post lysis of adhesions in     2008 at [facility]. 2. Endometriosis. 3. Status post cholecystectomy. 4. Status post remote appendectomy. 5. Previous laparoscopic band surgery with removal of band. 6. History of migraine headaches. 7. History of kidney stones. 8. History of very mild gastritis per EGD  performed on July 14, 2009.  MEDICATIONS: 1. Phenergan suppositories 25 mg PR q.48 h. p.r.n. nausea. 2. Percocet tab 1-2 tablets p.o. q.4 h. p.r.n. pain. 3. Cymbalta 60 mg p.o. daily. 4. Propranolol 20 mg p.o. daily.  ALLERGIES: 1. COMPAZINE causes agitation. 2. TORADOL causes nausea, vomiting, and agitation. 3. REGLAN causes agitation. 4. HALDOL causes agitation.  FAMILY HISTORY:  Mother alive at age 56 with no medical problems. Father deceased at age 32 with Hodgkin's lymphoma and MI.  SOCIAL HISTORY:  The patient is single.  She is currently living with boyfriend.  She has no children.  She works at [employer].  She denies any tobacco, EtOH or illegal drug use.  The patient is gravida 0.  REVIEW OF SYSTEMS:  As stated in HPI, otherwise negative.  PHYSICAL EXAM:  VITAL SIGNS:  Blood pressure 100/64, heart rate 81, respirations 16, temperature 98.3, O2 sat is 97% on room air. GENERAL:  This is an obese Caucasian female, lying in stretcher, in no acute distress. HEENT:  Head is normocephalic, atraumatic.  Eyes, extraocular movements are intact without scleral icterus or injection.  Ear, nose and throat mucous membranes are slightly dry.  No oropharyngeal lesions  are noted. NECK:  Thick, supple with no thyromegaly or lymphadenopathy.  No JVD or carotid bruits. CHEST:  Symmetrical movement, nontender to palpation. CARDIOVASCULAR:  S1, S2, regular rate and rhythm.  No murmur, rub or gallop.  No lower extremity edema. RESPIRATORY:  Lung sounds are clear to auscultation bilaterally.  No wheezes, rales or crackles.  No increased work of breathing. GI:  Abdomen is obese, soft, nondistended with positive bowel sounds. The patient does have tenderness upon light palpation of right lower quadrant. NEUROLOGIC:  The patient is able move all extremities x4 without motor sensory deficit on exam. PSYCHOLOGIC:  The patient is alert and oriented x4 with normal mood  and affect.  PERTINENT LABS:  White cell count 7.7 with no shift, platelet count 310, hemoglobin 13.5, hematocrit 40.8.  Sodium 137, potassium 4.2, chloride 101, CO2 27, BUN 9, creatinine 0.61, total bilirubin 0.3, alkaline phosphatase 81, AST 37, ALT 34, albumin 3.4.  Urinalysis yellow clear with specific gravity greater than 1.030 and trace leukocytes.  Urine microscopic shows few epithelials with 3 to 6 WBCs and few bacteria.  Urine pregnancy is negative.  Wet prep obtained Sep 08, 2010 unremarkable.  GC Chlamydia probe negative.  CT of the abdomen and pelvis performed Sep 08, 2010 showing 2.9 cm right ovarian cyst, otherwise unremarkable.  ASSESSMENT AND PLAN: 1. Right lower quadrant pain with nausea and vomiting.  Unclear     etiology at this time, the patient has been admitted for same.  She     has been referred to her primary gynecologist at [facility] for     further evaluation.  The patient's symptoms could be related to     ovarian cyst versus recurrent abdominal wall adhesions versus     other.  We will ask surgery to evaluate the patient as it would be     extremely difficult to transfer the patient to [facility] where she has     been seen before without any acute surgical need.       We will control the patient's pain with IV     narcotic medication for the     time being.  We will empirically treat for urinary tract infection     with IV antibiotics and ask that staff recollect clean catch urine     with culture and sensitivity for further evaluation.  We will also     order for gentle IV fluids. 2. Prophylaxis order for SCDs for DVT prophylaxis.     Cordelia Pen, NP   ______________________________ Conley Canal, MD    LE/MEDQ  D:  09/15/2010  T:  09/15/2010  Job:  [id number]  cc:   Dineen Kid. Rana Snare, M.D. Tally Joe, M.D.  Electronically Signed by Cordelia Pen NP on 11/11/2010 12:10:51 PM Electronically Signed by Conley Canal  on 11/17/2010 07:31:29  PM

## 2011-01-30 LAB — BASIC METABOLIC PANEL
BUN: 8
CO2: 21
Chloride: 104
Creatinine, Ser: 0.61
Glucose, Bld: 109 — ABNORMAL HIGH

## 2011-01-30 LAB — COMPREHENSIVE METABOLIC PANEL
AST: 29
CO2: 23
Calcium: 9.6
Creatinine, Ser: 0.53
GFR calc Af Amer: >60
GFR calc non Af Amer: >60

## 2011-01-30 LAB — CBC
HCT: 49.7 — ABNORMAL HIGH
Hemoglobin: 16.8 — ABNORMAL HIGH
MCHC: 33.7
MCV: 90.8
MCV: 92.6
Platelets: 363
RBC: 4.95
RBC: 5.37 — ABNORMAL HIGH
RDW: 13.5
WBC: 11.4 — ABNORMAL HIGH

## 2011-01-30 LAB — URINALYSIS, ROUTINE W REFLEX MICROSCOPIC
Bilirubin Urine: NEGATIVE
Glucose, UA: NEGATIVE
Hgb urine dipstick: NEGATIVE
Specific Gravity, Urine: 1.015

## 2011-01-30 LAB — PREGNANCY, URINE: Preg Test, Ur: NEGATIVE

## 2011-02-02 ENCOUNTER — Emergency Department: Payer: Self-pay | Admitting: Emergency Medicine

## 2011-02-23 ENCOUNTER — Other Ambulatory Visit: Payer: Self-pay | Admitting: Ophthalmology

## 2011-03-09 ENCOUNTER — Encounter (HOSPITAL_COMMUNITY): Payer: Self-pay | Admitting: Pharmacy Technician

## 2011-03-09 DIAGNOSIS — K2970 Gastritis, unspecified, without bleeding: Secondary | ICD-10-CM

## 2011-03-09 HISTORY — DX: Gastritis, unspecified, without bleeding: K29.70

## 2011-03-10 ENCOUNTER — Encounter (HOSPITAL_COMMUNITY)
Admission: RE | Admit: 2011-03-10 | Discharge: 2011-03-10 | Disposition: A | Discharge status: Home / Self Care | Payer: BC Managed Care – PPO | Source: Ambulatory Visit | Attending: Otolaryngology | Admitting: Otolaryngology

## 2011-03-10 ENCOUNTER — Encounter (HOSPITAL_COMMUNITY): Payer: Self-pay

## 2011-03-10 HISTORY — DX: Other complications of anesthesia, initial encounter: T88.59XA

## 2011-03-10 HISTORY — DX: Migraine, unspecified, not intractable, without status migrainosus: G43.909

## 2011-03-10 HISTORY — DX: Adverse effect of unspecified anesthetic, initial encounter: T41.45XA

## 2011-03-10 HISTORY — DX: Pneumonia, unspecified organism: J18.9

## 2011-03-10 HISTORY — DX: Unspecified purulent endophthalmitis, unspecified eye: H44.009

## 2011-03-10 LAB — SURGICAL PCR SCREEN
MRSA, PCR: NEGATIVE
Staphylococcus aureus: NEGATIVE

## 2011-03-10 LAB — CBC
MCV: 88.5 fL (ref 78.0–100.0)
Platelets: 375 10*3/uL (ref 150–400)
RBC: 4.54 MIL/uL (ref 3.87–5.11)
RDW: 13.1 % (ref 11.5–15.5)
WBC: 9.4 10*3/uL (ref 4.0–10.5)

## 2011-03-10 LAB — HCG, SERUM, QUALITATIVE: Preg, Serum: NEGATIVE

## 2011-03-10 NOTE — Pre-Procedure Instructions (Signed)
20 ROLLANDE THURSBY  03/10/2011   Your procedure is scheduled on: March 16, 2011  Report to [facility] Short Stay Center at 0900 AM.  Call this number if you have problems the morning of surgery: [phone]   Remember:   Do not eat food:After Midnight.  Do not drink clear liquids: 4 Hours before arrival.  Take these medicines the morning of surgery with A SIP OF WATER: Cymbalta, Propanolol   Do not wear jewelry, make-up or nail polish.  Do not wear lotions, powders, or perfumes. You may wear deodorant.  Do not shave 48 hours prior to surgery.  Do not bring valuables to the hospital.  Contacts, dentures or bridgework may not be worn into surgery.  Leave suitcase in the car. After surgery it may be brought to your room.  For patients admitted to the hospital, checkout time is 11:00 AM the day of discharge.   Patients discharged the day of surgery will not be allowed to drive home.  Name and phone number of your driver: Tobie Lords [phone]  Special Instructions: CHG Shower Use Special Wash: 1/2 bottle night before surgery and 1/2 bottle morning of surgery.   Please read over the following fact sheets that you were given: Pain Booklet, Coughing and Deep Breathing and MRSA Information

## 2011-03-15 MED ORDER — CEFAZOLIN SODIUM 1-5 GM-% IV SOLN
1.0000 g | INTRAVENOUS | Status: DC
  Filled 2011-03-15: qty 50

## 2011-03-15 NOTE — H&P (Signed)
Loretta Alexander is an 30 y.o. female.   Chief Complaint: nasal obstruction HPI: lifelong history of nasal obstruction. History of multiple nasal injuries. Sinus CT scan negative recently. History of recurring sinusitis.  Past Medical History  Diagnosis Date  . Complication of anesthesia     Difficult IV stick with Central line placement  . Migraines   . Pneumonia     04/2010  . Eye infection     within last 2 weeks    Past Surgical History  Procedure Date  . Tonsillectomy 1991  . Appendectomy 1992  . Cholecystectomy 2003  . Right ovary removed 2006  . Abdominal adhesion surgery 2006  . Knee arthroscopy 2001, 2010    left knee x 2  . Hernia repair     umbilical    No family history on file. Social History:  reports that she has never smoked. She has never used smokeless tobacco. She reports that she does not drink alcohol or use illicit drugs.  Allergies:  Allergies  Allergen Reactions  . Toradol (Ketorolac Tromethamine) Nausea And Vomiting  . Compazine (Prochlorperazine Maleate) Anxiety and Other (See Comments)    dizziness  . Haloperidol And Related Anxiety and Other (See Comments)    dizziness  . Reglan (Metoclopramide Hcl) Anxiety and Other (See Comments)    dizziness    No current facility-administered medications on file as of .   No current outpatient prescriptions on file as of .    No results found for this or any previous visit (from the past 48 hour(s)). No results found.  ROS: otherwise negative  There were no vitals taken for this visit.  PHYSICAL EXAM: Overall appearance:  Healthy appearing, in no distress, obese lady. Head:  Normocephalic, atraumatic. Ears: External auditory canals are clear; tympanic membranes are intact in the middle ears are free of any effusion. Nose: External nose is healthy in appearance. Internal nasal exam reveals significant rightward septal deviation with very large inferior turbinates. No evidence of polyps or  purulent exudate. Oral Cavity:  There are no mucosal lesions or masses identified. Oral Pharynx/Hypopharynx/Larynx: no signs of any mucosal lesions or masses identified. Vocal cords move normally. Neuro:  No identifiable neurologic deficits. Neck: No palpable neck masses.  Studies Reviewed: none    Assessment/Plan Significant nasal septal deviation with inferior turbinate hyperplasia, and significant obstruction. Proceed with nasal septoplasty and reduction of turbinates. Risks and benefits of the surgery were discussed including bleeding, infection, anesthetic complications.  Xaivier Malay H 03/15/2011, 1:00 PM

## 2011-03-16 ENCOUNTER — Encounter (HOSPITAL_COMMUNITY): Payer: Self-pay | Admitting: *Deleted

## 2011-03-16 ENCOUNTER — Ambulatory Visit (HOSPITAL_COMMUNITY): Payer: BC Managed Care – PPO | Admitting: Certified Registered Nurse Anesthetist

## 2011-03-16 ENCOUNTER — Encounter (HOSPITAL_COMMUNITY): Payer: Self-pay | Admitting: Certified Registered Nurse Anesthetist

## 2011-03-16 ENCOUNTER — Encounter (HOSPITAL_COMMUNITY)
Admission: RE | Disposition: A | Discharge status: Home / Self Care | Payer: Self-pay | Source: Ambulatory Visit | Attending: Otolaryngology

## 2011-03-16 ENCOUNTER — Ambulatory Visit (HOSPITAL_COMMUNITY)
Admission: RE | Admit: 2011-03-16 | Discharge: 2011-03-16 | Disposition: A | Discharge status: Home / Self Care | Payer: BC Managed Care – PPO | Source: Ambulatory Visit | Attending: Otolaryngology | Admitting: Otolaryngology

## 2011-03-16 DIAGNOSIS — Z01812 Encounter for preprocedural laboratory examination: Secondary | ICD-10-CM | POA: Insufficient documentation

## 2011-03-16 DIAGNOSIS — J342 Deviated nasal septum: Secondary | ICD-10-CM | POA: Insufficient documentation

## 2011-03-16 HISTORY — PX: NASAL SEPTOPLASTY W/ TURBINOPLASTY: SHX2070

## 2011-03-16 SURGERY — SEPTOPLASTY, NOSE, WITH NASAL TURBINATE REDUCTION
Anesthesia: General | Site: Nose | Laterality: Bilateral | Wound class: Clean Contaminated

## 2011-03-16 MED ORDER — CEPHALEXIN 500 MG PO CAPS: 500.0000 mg | ORAL_CAPSULE | Freq: Three times a day (TID) | ORAL | Status: DC

## 2011-03-16 MED ORDER — ONDANSETRON HCL 8 MG PO TABS: 8.0000 mg | ORAL_TABLET | Freq: Three times a day (TID) | ORAL | Status: AC | PRN

## 2011-03-16 MED ORDER — BACITRACIN ZINC 500 UNIT/GM EX OINT
TOPICAL_OINTMENT | CUTANEOUS | Status: DC | PRN
  Administered 2011-03-16: 1 via TOPICAL

## 2011-03-16 MED ORDER — LIDOCAINE-EPINEPHRINE 1 %-1:100000 IJ SOLN
INTRAMUSCULAR | Status: DC | PRN
  Administered 2011-03-16: 4 mL

## 2011-03-16 MED ORDER — HYDROCODONE-ACETAMINOPHEN 7.5-500 MG PO TABS: 1.0000 | ORAL_TABLET | Freq: Four times a day (QID) | ORAL | Status: DC | PRN

## 2011-03-16 MED ORDER — FENTANYL CITRATE 0.05 MG/ML IJ SOLN
INTRAMUSCULAR | Status: DC | PRN
  Administered 2011-03-16 (×3): 50 ug via INTRAVENOUS
  Administered 2011-03-16: 100 ug via INTRAVENOUS

## 2011-03-16 MED ORDER — NEOSTIGMINE METHYLSULFATE 1 MG/ML IJ SOLN
INTRAMUSCULAR | Status: DC | PRN
  Administered 2011-03-16: 3 mg via INTRAVENOUS

## 2011-03-16 MED ORDER — HYDROMORPHONE HCL PF 1 MG/ML IJ SOLN
0.2500 mg | INTRAMUSCULAR | Status: DC | PRN
  Administered 2011-03-16 (×4): 0.25 mg via INTRAVENOUS

## 2011-03-16 MED ORDER — LIDOCAINE HCL (CARDIAC) 20 MG/ML IV SOLN
INTRAVENOUS | Status: DC | PRN
  Administered 2011-03-16: 60 mg via INTRAVENOUS

## 2011-03-16 MED ORDER — CEFAZOLIN SODIUM-DEXTROSE 2-3 GM-% IV SOLR
2.0000 g | INTRAVENOUS | Status: DC
  Filled 2011-03-16: qty 50

## 2011-03-16 MED ORDER — ONDANSETRON HCL 4 MG/2ML IJ SOLN
INTRAMUSCULAR | Status: DC | PRN
  Administered 2011-03-16: 4 mg via INTRAVENOUS

## 2011-03-16 MED ORDER — ROCURONIUM BROMIDE 100 MG/10ML IV SOLN
INTRAVENOUS | Status: DC | PRN
  Administered 2011-03-16: 50 mg via INTRAVENOUS

## 2011-03-16 MED ORDER — CEFAZOLIN SODIUM 1-5 GM-% IV SOLN
INTRAVENOUS | Status: DC | PRN
  Administered 2011-03-16: 2 g via INTRAVENOUS

## 2011-03-16 MED ORDER — CEPHALEXIN 500 MG PO CAPS: 500.0000 mg | ORAL_CAPSULE | Freq: Three times a day (TID) | ORAL | Status: AC

## 2011-03-16 MED ORDER — GLYCOPYRROLATE 0.2 MG/ML IJ SOLN
INTRAMUSCULAR | Status: DC | PRN
  Administered 2011-03-16: .6 mg via INTRAVENOUS

## 2011-03-16 MED ORDER — OXYMETAZOLINE HCL 0.05 % NA SOLN
NASAL | Status: DC | PRN
  Administered 2011-03-16: 1

## 2011-03-16 MED ORDER — FENTANYL CITRATE 0.05 MG/ML IJ SOLN: 1.0000 ug/kg | INTRAMUSCULAR | Status: DC | PRN

## 2011-03-16 MED ORDER — HYDROCODONE-ACETAMINOPHEN 7.5-500 MG/15ML PO SOLN
15.0000 mL | Freq: Once | ORAL | Status: AC
Start: 2011-03-16 — End: 2011-03-16
  Administered 2011-03-16: 15 mL via ORAL

## 2011-03-16 MED ORDER — ONDANSETRON HCL 4 MG/2ML IJ SOLN: 4.0000 mg | Freq: Once | INTRAMUSCULAR | Status: DC | PRN

## 2011-03-16 MED ORDER — ONDANSETRON HCL 8 MG PO TABS: 8.0000 mg | ORAL_TABLET | Freq: Three times a day (TID) | ORAL | Status: DC | PRN

## 2011-03-16 MED ORDER — LACTATED RINGERS IV SOLN
INTRAVENOUS | Status: DC
  Administered 2011-03-16: 11:00:00 via INTRAVENOUS

## 2011-03-16 MED ORDER — LACTATED RINGERS IV SOLN
INTRAVENOUS | Status: DC | PRN
  Administered 2011-03-16: 11:00:00 via INTRAVENOUS

## 2011-03-16 MED ORDER — PROPOFOL 10 MG/ML IV EMUL
INTRAVENOUS | Status: DC | PRN
  Administered 2011-03-16: 200 mg via INTRAVENOUS
  Administered 2011-03-16: 50 mg via INTRAVENOUS

## 2011-03-16 MED ORDER — HYDROCODONE-ACETAMINOPHEN 7.5-500 MG PO TABS: 1.0000 | ORAL_TABLET | Freq: Four times a day (QID) | ORAL | Status: AC | PRN

## 2011-03-16 MED ORDER — MIDAZOLAM HCL 5 MG/5ML IJ SOLN
INTRAMUSCULAR | Status: DC | PRN
  Administered 2011-03-16: 2 mg via INTRAVENOUS

## 2011-03-16 SURGICAL SUPPLY — 24 items
ATTRACTOMAT 16X20 MAGNETIC DRP (DRAPES) IMPLANT
CANISTER SUCTION 2500CC (MISCELLANEOUS) ×2 IMPLANT
CLOTH BEACON ORANGE TIMEOUT ST (SAFETY) ×2 IMPLANT
COAGULATOR SUCT SWTCH 10FR 6 (ELECTROSURGICAL) IMPLANT
CRADLE DONUT ADULT HEAD (MISCELLANEOUS) ×2 IMPLANT
DRESSING TELFA 8X3 (GAUZE/BANDAGES/DRESSINGS) ×2 IMPLANT
GAUZE SPONGE 2X2 8PLY STRL LF (GAUZE/BANDAGES/DRESSINGS) ×1 IMPLANT
GLOVE ECLIPSE 6.5 STRL STRAW (GLOVE) ×4 IMPLANT
GLOVE ECLIPSE 7.5 STRL STRAW (GLOVE) ×2 IMPLANT
GOWN STRL NON-REIN LRG LVL3 (GOWN DISPOSABLE) ×6 IMPLANT
KIT BASIN OR (CUSTOM PROCEDURE TRAY) ×2 IMPLANT
KIT ROOM TURNOVER OR (KITS) ×2 IMPLANT
NEEDLE 27GAX1X1/2 (NEEDLE) ×2 IMPLANT
NS IRRIG 1000ML POUR BTL (IV SOLUTION) ×2 IMPLANT
PAD ARMBOARD 7.5X6 YLW CONV (MISCELLANEOUS) ×2 IMPLANT
PATTIES SURGICAL .5 X3 (DISPOSABLE) ×2 IMPLANT
SPONGE GAUZE 2X2 STER 10/PKG (GAUZE/BANDAGES/DRESSINGS) ×1
SUT CHROMIC 4 0 G 3 (SUTURE) ×2 IMPLANT
SUT ETHILON 3 0 PS 1 (SUTURE) ×2 IMPLANT
SUT PLAIN 4 0 ~~LOC~~ 1 (SUTURE) ×4 IMPLANT
TOWEL OR 17X24 6PK STRL BLUE (TOWEL DISPOSABLE) ×2 IMPLANT
TOWEL OR 17X26 10 PK STRL BLUE (TOWEL DISPOSABLE) ×2 IMPLANT
TRAY ENT MC OR (CUSTOM PROCEDURE TRAY) ×2 IMPLANT
WATER STERILE IRR 1000ML POUR (IV SOLUTION) ×2 IMPLANT

## 2011-03-16 NOTE — Anesthesia Procedure Notes (Signed)
Procedure Name: Intubation Date/Time: 03/16/2011 11:47 AM Performed by: Delbert Harness Pre-anesthesia Checklist: Patient identified, Timeout performed, Emergency Drugs available, Suction available and Patient being monitored Patient Re-evaluated:Patient Re-evaluated prior to inductionOxygen Delivery Method: Circle System Utilized Preoxygenation: Pre-oxygenation with 100% oxygen Intubation Type: Combination inhalational/ intravenous induction Ventilation: Two handed mask ventilation required and Oral airway inserted - appropriate to patient size Laryngoscope Size: Mac and 3 Grade View: Grade II Tube type: Oral Tube size: 7.5 mm Number of attempts: 1 Airway Equipment and Method: stylet Placement Confirmation: ETT inserted through vocal cords under direct vision,  positive ETCO2,  CO2 detector and breath sounds checked- equal and bilateral Secured at: 22 cm Tube secured with: Tape Dental Injury: Teeth and Oropharynx as per pre-operative assessment

## 2011-03-16 NOTE — Anesthesia Preprocedure Evaluation (Addendum)
Anesthesia Evaluation  Patient identified by MRN, date of birth, ID band Patient awake    Reviewed: Allergy & Precautions, H&P , NPO status , Patient's Chart, lab work & pertinent test results  History of Anesthesia Complications (+) Family history of anesthesia reaction  Airway Mallampati: II TM Distance: >3 FB Neck ROM: full    Dental  (+) Teeth Intact and Dental Advisory Given   Pulmonary pneumonia ,    Pulmonary exam normal       Cardiovascular     Neuro/Psych  Headaches,    GI/Hepatic   Endo/Other    Renal/GU      Musculoskeletal   Abdominal   Peds  Hematology   Anesthesia Other Findings   Reproductive/Obstetrics                          Anesthesia Physical Anesthesia Plan  ASA: II  Anesthesia Plan: General   Post-op Pain Management:    Induction: Intravenous  Airway Management Planned: Oral ETT  Additional Equipment:   Intra-op Plan:   Post-operative Plan: Extubation in OR  Informed Consent: I have reviewed the patients History and Physical, chart, labs and discussed the procedure including the risks, benefits and alternatives for the proposed anesthesia with the patient or authorized representative who has indicated his/her understanding and acceptance.   Dental advisory given  Plan Discussed with: CRNA and Surgeon  Anesthesia Plan Comments:         Anesthesia Quick Evaluation

## 2011-03-16 NOTE — Preoperative (Addendum)
Beta Blockers   Reason not to administer Beta Blockers:Not Applicable 

## 2011-03-16 NOTE — Addendum Note (Signed)
Addendum  created 03/16/11 1510 by Raiford Simmonds, MD   Modules edited:Orders

## 2011-03-16 NOTE — Op Note (Signed)
03/16/2011  1:24 PM  PATIENT:  Loretta Alexander  30 y.o. female  PRE-OPERATIVE DIAGNOSIS:  deviated nasel septum  POST-OPERATIVE DIAGNOSIS:  deviated nasal septum  PROCEDURE:  Procedure(s): NASAL SEPTOPLASTY WITH TURBINATE REDUCTION  SURGEON:  Surgeon(s): Susy Frizzle, MD  PHYSICIAN ASSISTANT:   ASSISTANTS: none   ANESTHESIA:   general  EBL:  Total I/O In: 500 [I.V.:500] Out: 150 [Blood:150]  BLOOD ADMINISTERED:none  DRAINS: none   LOCAL MEDICATIONS USED:  XYLOCAINE with epinephrine, less than 5CC  SPECIMEN:  No Specimen  DISPOSITION OF SPECIMEN:  N/A  COUNTS:  YES  TOURNIQUET:  * No tourniquets in log *  DICTATION: .Dragon Dictationpatient was taken to the operating room and placed on the operating table in the supine position. Following induction of general endotracheal anesthesia, the nose was draped in a standard fashion. Afrin spray was used preoperatively. 1% Xylocaine with epinephrine was infiltrated into the septum, columella, and the inferior turbinates bilaterally. Afrin soaked pledgets were then placed for several minutes.  A left hemitransfixion incision was created with a 15 scalpel. A mucoperichondrial flap was elevated on the left side using a caudal elevator. The bony cartilaginous junction was divided and a similar mucosal flap was developed down the right side. A Jansen-Middleton rongeur was used to remove very thickened and deviated ethmoid plate bone. The vomer and anterior maxillary crest were also removed. The maxillary crest was removed using a 4 mm osteotome. A small portion of the posterior quadrangular cartilage was resected. The septum was now allowed to lie nicely at the midline. The mucosal incision was reapproximated with 4-0 chromic suture. The septal flaps were quilted with plain gut.  Submucous resection inferior turbinates. The anterior edge of the inferior turbinates were incised with a 15 scalpel in a vertical fashion. A caudal elevator  was used to elevate mucosa off of bone. Large bony fragments were removed using a Takahashi forceps. The turbinate remnants were then outfractured with the Cottle elevator.  The nasal cavities were suctioned of blood and packed with rolled up Telfa coated with bacitracin ointment. The pharynx was also suctioned clear. The patient was then awakened, extubated and transferred to PACU in stable condition.  PLAN OF CARE: Discharge to home after PACU  PATIENT DISPOSITION:  PACU - hemodynamically stable.   Delay start of Pharmacological VTE agent (>24hrs) due to surgical blood loss or risk of bleeding:  yes

## 2011-03-16 NOTE — Transfer of Care (Signed)
Immediate Anesthesia Transfer of Care Note  Patient: Loretta Alexander  Procedure(s) Performed:  NASAL SEPTOPLASTY WITH TURBINATE REDUCTION  Patient Location: PACU  Anesthesia Type: General  Level of Consciousness: awake and alert   Airway & Oxygen Therapy: Patient Spontanous Breathing and Patient connected to face mask oxygen  Post-op Assessment: Report given to PACU RN and Post -op Vital signs reviewed and stable  Post vital signs: Reviewed and stable  Complications: No apparent anesthesia complications

## 2011-03-16 NOTE — Anesthesia Postprocedure Evaluation (Signed)
Anesthesia Post Note  Patient: Loretta Alexander  Procedure(s) Performed:  NASAL SEPTOPLASTY WITH TURBINATE REDUCTION  Anesthesia type: General  Patient location: PACU  Post pain: Pain level controlled and Adequate analgesia  Post assessment: Post-op Vital signs reviewed, Patient's Cardiovascular Status Stable, Respiratory Function Stable, Patent Airway and Pain level controlled  Last Vitals:  Filed Vitals:   03/16/11 1445  BP:   Pulse: 81  Temp:   Resp: 15    Post vital signs: Reviewed and stable  Level of consciousness: awake, alert  and oriented  Complications: No apparent anesthesia complications

## 2011-03-17 MED FILL — Mupirocin Oint 2%: CUTANEOUS | Qty: 22 | Status: AC

## 2011-03-22 ENCOUNTER — Encounter (HOSPITAL_COMMUNITY): Payer: Self-pay | Admitting: Otolaryngology

## 2011-04-03 ENCOUNTER — Emergency Department (HOSPITAL_COMMUNITY): Payer: BC Managed Care – PPO

## 2011-04-03 ENCOUNTER — Inpatient Hospital Stay (HOSPITAL_COMMUNITY)
Admission: EM | Admit: 2011-04-03 | Discharge: 2011-04-11 | DRG: 183 | Disposition: A | Discharge status: Home / Self Care | Payer: BC Managed Care – PPO | Attending: General Surgery | Admitting: General Surgery

## 2011-04-03 ENCOUNTER — Encounter (HOSPITAL_COMMUNITY): Payer: Self-pay

## 2011-04-03 DIAGNOSIS — Z1889 Other specified retained foreign body fragments: Secondary | ICD-10-CM

## 2011-04-03 DIAGNOSIS — K2990 Gastroduodenitis, unspecified, without bleeding: Principal | ICD-10-CM | POA: Diagnosis present

## 2011-04-03 DIAGNOSIS — R109 Unspecified abdominal pain: Secondary | ICD-10-CM

## 2011-04-03 DIAGNOSIS — R112 Nausea with vomiting, unspecified: Secondary | ICD-10-CM

## 2011-04-03 DIAGNOSIS — K2970 Gastritis, unspecified, without bleeding: Principal | ICD-10-CM

## 2011-04-03 DIAGNOSIS — M940 Chondrocostal junction syndrome [Tietze]: Secondary | ICD-10-CM | POA: Diagnosis present

## 2011-04-03 DIAGNOSIS — Z9884 Bariatric surgery status: Secondary | ICD-10-CM

## 2011-04-03 DIAGNOSIS — R1012 Left upper quadrant pain: Secondary | ICD-10-CM | POA: Diagnosis present

## 2011-04-03 DIAGNOSIS — M795 Residual foreign body in soft tissue: Secondary | ICD-10-CM | POA: Diagnosis present

## 2011-04-03 DIAGNOSIS — IMO0002 Reserved for concepts with insufficient information to code with codable children: Secondary | ICD-10-CM

## 2011-04-03 DIAGNOSIS — K209 Esophagitis, unspecified without bleeding: Secondary | ICD-10-CM | POA: Diagnosis present

## 2011-04-03 HISTORY — DX: Depression, unspecified: F32.A

## 2011-04-03 HISTORY — DX: Major depressive disorder, single episode, unspecified: F32.9

## 2011-04-03 LAB — URINALYSIS, ROUTINE W REFLEX MICROSCOPIC
Glucose, UA: NEGATIVE mg/dL
Ketones, ur: NEGATIVE mg/dL
Specific Gravity, Urine: 1.027 (ref 1.005–1.030)
pH: 6 (ref 5.0–8.0)

## 2011-04-03 LAB — DIFFERENTIAL
Lymphocytes Relative: 30 % (ref 12–46)
Lymphs Abs: 2.7 10*3/uL (ref 0.7–4.0)
Monocytes Relative: 7 % (ref 3–12)
Neutro Abs: 5.5 10*3/uL (ref 1.7–7.7)
Neutrophils Relative %: 61 % (ref 43–77)

## 2011-04-03 LAB — CBC
HCT: 36.6 % (ref 36.0–46.0)
Hemoglobin: 14.2 g/dL (ref 12.0–15.0)
MCHC: 33.1 g/dL (ref 30.0–36.0)
Platelets: 431 10*3/uL — ABNORMAL HIGH (ref 150–400)
Platelets: 365 10*3/uL (ref 150–400)
RBC: 4.64 MIL/uL (ref 3.87–5.11)
RDW: 13.4 % (ref 11.5–15.5)
WBC: 9.1 10*3/uL (ref 4.0–10.5)
WBC: 9.6 10*3/uL (ref 4.0–10.5)

## 2011-04-03 LAB — COMPREHENSIVE METABOLIC PANEL
ALT: 25 U/L (ref 0–35)
Alkaline Phosphatase: 92 U/L (ref 39–117)
BUN: 10 mg/dL (ref 6–23)
CO2: 24 mEq/L (ref 19–32)
Chloride: 107 mEq/L (ref 96–112)
GFR calc Af Amer: >90 mL/min (ref >90)
GFR calc non Af Amer: >90 mL/min (ref >90)
Glucose, Bld: 110 mg/dL — ABNORMAL HIGH (ref 70–99)
Potassium: 3.7 mEq/L (ref 3.5–5.1)
Sodium: 139 mEq/L (ref 135–145)
Total Bilirubin: 0.3 mg/dL (ref 0.3–1.2)
Total Protein: 7.9 g/dL (ref 6.0–8.3)

## 2011-04-03 LAB — POCT PREGNANCY, URINE: Preg Test, Ur: NEGATIVE

## 2011-04-03 LAB — PROTIME-INR: INR: 0.94 (ref 0.00–1.49)

## 2011-04-03 LAB — URINE MICROSCOPIC-ADD ON

## 2011-04-03 LAB — CREATININE, SERUM: GFR calc non Af Amer: >90 mL/min (ref >90)

## 2011-04-03 MED ORDER — PROMETHAZINE HCL 25 MG/ML IJ SOLN
12.5000 mg | Freq: Four times a day (QID) | INTRAMUSCULAR | Status: DC | PRN
  Administered 2011-04-03 – 2011-04-05 (×8): 12.5 mg via INTRAVENOUS
  Administered 2011-04-05: 22:00:00 via INTRAVENOUS
  Administered 2011-04-06 (×2): 12.5 mg via INTRAVENOUS
  Filled 2011-04-03 (×11): qty 1

## 2011-04-03 MED ORDER — ACETAMINOPHEN 650 MG RE SUPP: 650.0000 mg | Freq: Four times a day (QID) | RECTAL | Status: DC | PRN

## 2011-04-03 MED ORDER — MORPHINE SULFATE 2 MG/ML IJ SOLN
1.0000 mg | INTRAMUSCULAR | Status: DC | PRN
  Administered 2011-04-03 (×2): 2 mg via INTRAVENOUS
  Administered 2011-04-04 (×4): 4 mg via INTRAVENOUS
  Administered 2011-04-04: 3 mg via INTRAVENOUS
  Administered 2011-04-04 – 2011-04-05 (×5): 4 mg via INTRAVENOUS
  Administered 2011-04-05: 2 mg via INTRAVENOUS
  Administered 2011-04-05: 4 mg via INTRAVENOUS
  Administered 2011-04-05: 2 mg via INTRAVENOUS
  Administered 2011-04-05: 4 mg via INTRAVENOUS
  Administered 2011-04-05: 2 mg via INTRAVENOUS
  Administered 2011-04-05 – 2011-04-06 (×6): 4 mg via INTRAVENOUS
  Administered 2011-04-06: 2 mg via INTRAVENOUS
  Administered 2011-04-06 (×2): 4 mg via INTRAVENOUS
  Administered 2011-04-06: 2 mg via INTRAVENOUS
  Administered 2011-04-06 – 2011-04-07 (×8): 4 mg via INTRAVENOUS
  Filled 2011-04-03 (×7): qty 2
  Filled 2011-04-03: qty 1
  Filled 2011-04-03 (×4): qty 2
  Filled 2011-04-03 (×2): qty 1
  Filled 2011-04-03 (×8): qty 2
  Filled 2011-04-03: qty 1
  Filled 2011-04-03 (×10): qty 2
  Filled 2011-04-03 (×3): qty 1
  Filled 2011-04-03: qty 2

## 2011-04-03 MED ORDER — SODIUM CHLORIDE 0.9 % IV SOLN
INTRAVENOUS | Status: DC
  Administered 2011-04-03 – 2011-04-04 (×2): via INTRAVENOUS

## 2011-04-03 MED ORDER — SODIUM CHLORIDE 0.9 % IV BOLUS (SEPSIS)
1000.0000 mL | Freq: Once | INTRAVENOUS | Status: AC
  Administered 2011-04-03: 1000 mL via INTRAVENOUS

## 2011-04-03 MED ORDER — PANTOPRAZOLE SODIUM 40 MG IV SOLR
40.0000 mg | Freq: Every day | INTRAVENOUS | Status: DC
  Administered 2011-04-04 – 2011-04-05 (×3): 40 mg via INTRAVENOUS
  Filled 2011-04-03 (×4): qty 40

## 2011-04-03 MED ORDER — QUETIAPINE FUMARATE 25 MG PO TABS
25.0000 mg | ORAL_TABLET | Freq: Every evening | ORAL | Status: DC | PRN
  Filled 2011-04-03: qty 1

## 2011-04-03 MED ORDER — HYDROMORPHONE HCL PF 1 MG/ML IJ SOLN
INTRAMUSCULAR | Status: AC
  Administered 2011-04-03: 1 mg via INTRAVENOUS
  Filled 2011-04-03: qty 1

## 2011-04-03 MED ORDER — MORPHINE SULFATE 4 MG/ML IJ SOLN
8.0000 mg | Freq: Once | INTRAMUSCULAR | Status: AC
  Administered 2011-04-03: 8 mg via INTRAVENOUS
  Filled 2011-04-03: qty 2

## 2011-04-03 MED ORDER — ACETAMINOPHEN 325 MG PO TABS: 650.0000 mg | ORAL_TABLET | Freq: Four times a day (QID) | ORAL | Status: DC | PRN

## 2011-04-03 MED ORDER — ENOXAPARIN SODIUM 40 MG/0.4ML ~~LOC~~ SOLN
40.0000 mg | SUBCUTANEOUS | Status: DC
  Administered 2011-04-03 – 2011-04-10 (×7): 40 mg via SUBCUTANEOUS
  Filled 2011-04-03 (×9): qty 0.4

## 2011-04-03 MED ORDER — ONDANSETRON HCL 4 MG/2ML IJ SOLN
4.0000 mg | Freq: Once | INTRAMUSCULAR | Status: AC
  Administered 2011-04-03: 4 mg via INTRAVENOUS
  Filled 2011-04-03: qty 2

## 2011-04-03 MED ORDER — DULOXETINE HCL 60 MG PO CPEP
60.0000 mg | ORAL_CAPSULE | Freq: Every day | ORAL | Status: DC
  Administered 2011-04-03 – 2011-04-11 (×9): 60 mg via ORAL
  Filled 2011-04-03 (×10): qty 1

## 2011-04-03 MED ORDER — HYDROMORPHONE HCL PF 1 MG/ML IJ SOLN: 1.0000 mg | Freq: Once | INTRAMUSCULAR | Status: DC

## 2011-04-03 MED ORDER — HYDROMORPHONE HCL PF 2 MG/ML IJ SOLN: 1.0000 mg | Freq: Once | INTRAMUSCULAR | Status: AC

## 2011-04-03 MED ORDER — HYDROMORPHONE HCL PF 1 MG/ML IJ SOLN
1.0000 mg | Freq: Once | INTRAMUSCULAR | Status: DC
  Administered 2011-04-03: 1 mg via INTRAVENOUS

## 2011-04-03 MED ORDER — MORPHINE SULFATE 4 MG/ML IJ SOLN
8.0000 mg | Freq: Once | INTRAMUSCULAR | Status: AC
  Administered 2011-04-03: 8 mg via INTRAVENOUS
  Filled 2011-04-03: qty 1

## 2011-04-03 MED ORDER — DIPHENHYDRAMINE HCL 50 MG/ML IJ SOLN
INTRAMUSCULAR | Status: AC
  Administered 2011-04-03: 12.5 mg via INTRAVENOUS
  Filled 2011-04-03: qty 1

## 2011-04-03 MED ORDER — MORPHINE SULFATE 4 MG/ML IJ SOLN
INTRAMUSCULAR | Status: AC
  Administered 2011-04-03: 8 mg via INTRAVENOUS
  Filled 2011-04-03: qty 1

## 2011-04-03 MED ORDER — DIPHENHYDRAMINE HCL 50 MG/ML IJ SOLN
12.5000 mg | Freq: Four times a day (QID) | INTRAMUSCULAR | Status: DC | PRN
  Administered 2011-04-03 – 2011-04-06 (×12): 25 mg via INTRAVENOUS
  Administered 2011-04-07: 12.5 mg via INTRAVENOUS
  Filled 2011-04-03 (×13): qty 1

## 2011-04-03 MED ORDER — IOHEXOL 300 MG/ML  SOLN
100.0000 mL | Freq: Once | INTRAMUSCULAR | Status: AC | PRN
  Administered 2011-04-03: 100 mL via INTRAVENOUS
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
## 2011-04-03 NOTE — ED Notes (Signed)
MD at bedside. 
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
## 2011-04-03 NOTE — ED Provider Notes (Signed)
Spoke with Dr.Hoxworth , he will admit pt  Doug Sou, MD 04/03/11 [phone]

## 2011-04-03 NOTE — ED Notes (Signed)
[id number]<AV> Expected date:<BR> Expected time:<BR> Means of arrival:<BR> Comments:<BR> Closed
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
## 2011-04-04 LAB — CBC
HCT: 36 % (ref 36.0–46.0)
MCHC: 32.8 g/dL (ref 30.0–36.0)
MCV: 91.6 fL (ref 78.0–100.0)
Platelets: 350 10*3/uL (ref 150–400)
RDW: 13.3 % (ref 11.5–15.5)

## 2011-04-04 LAB — BASIC METABOLIC PANEL
BUN: 7 mg/dL (ref 6–23)
CO2: 22 mEq/L (ref 19–32)
Calcium: 8.6 mg/dL (ref 8.4–10.5)
Chloride: 107 mEq/L (ref 96–112)
Creatinine, Ser: 0.58 mg/dL (ref 0.50–1.10)
GFR calc Af Amer: >90 mL/min (ref >90)

## 2011-04-04 LAB — LIPASE, BLOOD: Lipase: 30 U/L (ref 11–59)

## 2011-04-04 MED ORDER — KCL IN DEXTROSE-NACL 20-5-0.45 MEQ/L-%-% IV SOLN
INTRAVENOUS | Status: DC
  Administered 2011-04-04 – 2011-04-10 (×5): via INTRAVENOUS
  Filled 2011-04-04 (×23): qty 1000

## 2011-04-04 MED ORDER — MORPHINE SULFATE 4 MG/ML IJ SOLN
INTRAMUSCULAR | Status: AC
  Administered 2011-04-04: 2 mg
  Filled 2011-04-04: qty 1

## 2011-04-04 NOTE — Progress Notes (Signed)
Pt seen, agree with above note.  Source of patient's symptoms remain unclear.  Doreene Eland  With EGD  Mariella Saa MD, FACS  04/04/2011, 6:19 PM

## 2011-04-04 NOTE — H&P (Signed)
Pt examined, no trauma evidence.  Admit for further eval and Rx  Mariella Saa MD, FACS  04/04/2011, 6:45 PM
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
## 2011-04-04 NOTE — ED Notes (Signed)
Patient asking when her Benadryl is next due.  Let pt. Know that she can have it again around 6PM.  Mother at bedside.  Resp even unlabored.  IV infusing without difficulty.

## 2011-04-04 NOTE — ED Notes (Signed)
Attempted to call report.  Nurse unavailable. 

## 2011-04-05 ENCOUNTER — Encounter (HOSPITAL_COMMUNITY): Payer: Self-pay

## 2011-04-05 ENCOUNTER — Encounter (HOSPITAL_COMMUNITY)
Admission: EM | Disposition: A | Discharge status: Home / Self Care | Payer: Self-pay | Source: Home / Self Care | Attending: General Surgery

## 2011-04-05 HISTORY — PX: ESOPHAGOGASTRODUODENOSCOPY: SHX5428

## 2011-04-05 SURGERY — EGD (ESOPHAGOGASTRODUODENOSCOPY)
Anesthesia: Moderate Sedation

## 2011-04-05 MED ORDER — LIDOCAINE VISCOUS 2 % MT SOLN
OROMUCOSAL | Status: DC | PRN
  Administered 2011-04-05: 1 via OROMUCOSAL

## 2011-04-05 MED ORDER — ACETAMINOPHEN 10 MG/ML IV SOLN
1000.0000 mg | Freq: Four times a day (QID) | INTRAVENOUS | Status: AC
Start: 2011-04-05 — End: 2011-04-06
  Administered 2011-04-05 (×2): 1000 mg via INTRAVENOUS
  Filled 2011-04-05 (×4): qty 100

## 2011-04-05 MED ORDER — FENTANYL NICU IV SYRINGE 50 MCG/ML
INJECTION | INTRAMUSCULAR | Status: DC | PRN
  Administered 2011-04-05 (×4): 25 ug via INTRAVENOUS

## 2011-04-05 MED ORDER — MIDAZOLAM HCL 10 MG/2ML IJ SOLN
INTRAMUSCULAR | Status: DC | PRN
  Administered 2011-04-05 (×4): 2.5 mg via INTRAVENOUS

## 2011-04-05 MED ORDER — ONDANSETRON HCL 4 MG/2ML IJ SOLN
4.0000 mg | Freq: Four times a day (QID) | INTRAMUSCULAR | Status: DC | PRN
  Administered 2011-04-08 (×2): 4 mg via INTRAVENOUS
  Filled 2011-04-05 (×4): qty 2

## 2011-04-05 NOTE — Progress Notes (Signed)
Agree, pt in endo when i went by
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
## 2011-04-06 ENCOUNTER — Encounter (HOSPITAL_COMMUNITY): Payer: Self-pay

## 2011-04-06 ENCOUNTER — Encounter (HOSPITAL_COMMUNITY): Payer: Self-pay | Admitting: Gastroenterology

## 2011-04-06 DIAGNOSIS — K2970 Gastritis, unspecified, without bleeding: Secondary | ICD-10-CM

## 2011-04-06 MED ORDER — METHYLPREDNISOLONE 4 MG PO KIT: 4.0000 mg | PACK | ORAL | Status: DC

## 2011-04-06 MED ORDER — MORPHINE SULFATE 4 MG/ML IJ SOLN
INTRAMUSCULAR | Status: AC
  Administered 2011-04-06: 4 mg
  Filled 2011-04-06: qty 1

## 2011-04-06 MED ORDER — PROMETHAZINE HCL 25 MG/ML IJ SOLN
12.5000 mg | INTRAMUSCULAR | Status: DC | PRN
  Administered 2011-04-06 – 2011-04-07 (×4): 12.5 mg via INTRAVENOUS
  Filled 2011-04-06 (×4): qty 1

## 2011-04-06 MED ORDER — METHYLPREDNISOLONE 4 MG PO KIT
4.0000 mg | PACK | Freq: Three times a day (TID) | ORAL | Status: AC
  Administered 2011-04-07 (×3): 4 mg via ORAL

## 2011-04-06 MED ORDER — METHYLPREDNISOLONE 4 MG PO KIT
8.0000 mg | PACK | Freq: Every morning | ORAL | Status: AC
  Administered 2011-04-07: 8 mg via ORAL
  Filled 2011-04-06 (×2): qty 21

## 2011-04-06 MED ORDER — METHYLPREDNISOLONE 4 MG PO KIT
4.0000 mg | PACK | Freq: Four times a day (QID) | ORAL | Status: DC
  Administered 2011-04-08 – 2011-04-09 (×5): 4 mg via ORAL

## 2011-04-06 MED ORDER — PANTOPRAZOLE SODIUM 40 MG IV SOLR
40.0000 mg | Freq: Two times a day (BID) | INTRAVENOUS | Status: DC
  Administered 2011-04-06 – 2011-04-11 (×11): 40 mg via INTRAVENOUS
  Filled 2011-04-06 (×12): qty 40

## 2011-04-06 MED ORDER — METHYLPREDNISOLONE 4 MG PO KIT
8.0000 mg | PACK | Freq: Every evening | ORAL | Status: AC
  Administered 2011-04-07: 8 mg via ORAL

## 2011-04-06 MED ORDER — METHYLPREDNISOLONE 4 MG PO KIT
4.0000 mg | PACK | ORAL | Status: AC
  Administered 2011-04-07: 4 mg via ORAL
  Filled 2011-04-06: qty 21

## 2011-04-06 MED ORDER — LORAZEPAM 2 MG/ML IJ SOLN
0.5000 mg | Freq: Every evening | INTRAMUSCULAR | Status: DC | PRN
  Administered 2011-04-06 – 2011-04-10 (×5): 1 mg via INTRAVENOUS
  Filled 2011-04-06 (×5): qty 1

## 2011-04-06 NOTE — Progress Notes (Signed)
Patient ID: Loretta Alexander, female   DOB: [birth year], 30 y.o.   MRN: [id number] Subjective: Still with nausea/vomiting and abdominal pain.  Objective: Vital signs in last 24 hours: Temp:  [98 F (36.7 C)-98.5 F (36.9 C)] 98.5 F (36.9 C) (11/29 1406) Pulse Rate:  [96-99] 98  (11/29 1406) Resp:  [16-18] 18  (11/29 1406) BP: (92-112)/(57-70) 107/66 mmHg (11/29 1406) SpO2:  [97 %-98 %] 98 % (11/29 1406) Last BM Date: 04/03/11  Intake/Output from previous day: 11/28 0701 - 11/29 0700 In: 2550 [I.V.:2450; IV Piggyback:100] Out: -  Intake/Output this shift:    General appearance: alert and no distress GI: tender in the LUQ  Lab Results:  Basename 04/04/11 0530  WBC 6.4  HGB 11.8*  HCT 36.0  PLT 350   BMET  Basename 04/04/11 0530  NA 136  K 3.9  CL 107  CO2 22  GLUCOSE 119*  BUN 7  CREATININE 0.58  CALCIUM 8.6   LFT No results found for this basename: PROT,ALBUMIN,AST,ALT,ALKPHOS,BILITOT,BILIDIR,IBILI in the last 72 hours PT/INR No results found for this basename: LABPROT:2,INR:2 in the last 72 hours Hepatitis Panel No results found for this basename: HEPBSAG,HCVAB,HEPAIGM,HEPBIGM in the last 72 hours C-Diff No results found for this basename: CDIFFTOX:3 in the last 72 hours Fecal Lactopherrin No results found for this basename: FECLLACTOFRN in the last 72 hours  Studies/Results: No results found.  Medications: I have reviewed the patient's current medications.  Assessment/Plan: 1) Mild esophagitis 2) Costochondritis 3) Persistent nausea and vomiting   Given the findings of the EGD I do not feel that it can completely explain the degree of her symptoms.  I will give her a try of a medrol dose pack.  In my experience, I do not know why, but treatment of the costochondritis can alleviate the nausea and vomiting issue.  Plan: 1) Start medrol dose pack.  LOS: 3 days   Casidee Jann D 04/06/2011, 7:27 PM

## 2011-04-06 NOTE — Progress Notes (Signed)
Patient ID: Loretta Alexander, female   DOB: 10/08/1980, 30 y.o.   MRN: [id number] 1 Day Post-Op  Subjective: Pt conts to c/o nausea and emesis with clears.  C/o so much pain that sleeping is difficult.  Objective: Vital signs in last 24 hours: Temp:  [98 F (36.7 C)-98.4 F (36.9 C)] 98 F (36.7 C) (11/29 0555) Pulse Rate:  [90-99] 96  (11/29 0555) Resp:  [10-28] 16  (11/29 0555) BP: (90-137)/(45-73) 112/70 mmHg (11/29 0555) SpO2:  [87 %-100 %] 97 % (11/29 0555) Last BM Date: 04/03/11  Intake/Output from previous day: 11/28 0701 - 11/29 0700 In: 2550 [I.V.:2450; IV Piggyback:100] Out: -  Intake/Output this shift:    PE: Abd: soft, mildly tender in LUQ, +BS, ND Ht: regular Lungs: CTAB  Lab Results:   Basename 04/04/11 0530 04/03/11 1800  WBC 6.4 9.6  HGB 11.8* 12.1  HCT 36.0 36.6  PLT 350 365   BMET  Basename 04/04/11 0530 04/03/11 1800 04/03/11 1210  NA 136 -- 139  K 3.9 -- 3.7  CL 107 -- 107  CO2 22 -- 24  GLUCOSE 119* -- 110*  BUN 7 -- 10  CREATININE 0.58 0.62 --  CALCIUM 8.6 -- 9.8   PT/INR  Basename 04/03/11 1210  LABPROT 12.8  INR 0.94     Studies/Results: No results found.  Anti-infectives: Anti-infectives    None       Assessment/Plan  1. Gastritis with nausea and vomiting  Plan: Cont on clear liquids Once she can tolerate clear liquids, then she can be d/c home and diet can slowly be adv at home. Cont protonix   LOS: 3 days    Kynlea Blackston E 04/06/2011

## 2011-04-06 NOTE — Progress Notes (Signed)
Patient exam and agree with above  Mariella Saa MD, FACS  04/06/2011, 5:39 PM

## 2011-04-07 MED ORDER — OXYCODONE-ACETAMINOPHEN 5-325 MG PO TABS
1.0000 | ORAL_TABLET | ORAL | Status: DC | PRN
  Administered 2011-04-07 – 2011-04-10 (×2): 2 via ORAL
  Administered 2011-04-10: 1 via ORAL
  Administered 2011-04-10: 2 via ORAL
  Administered 2011-04-10: 1 via ORAL
  Filled 2011-04-07 (×4): qty 2

## 2011-04-07 MED ORDER — PROMETHAZINE HCL 25 MG PO TABS
12.5000 mg | ORAL_TABLET | Freq: Four times a day (QID) | ORAL | Status: DC | PRN
  Administered 2011-04-07: 12.5 mg via ORAL
  Filled 2011-04-07: qty 1

## 2011-04-07 MED ORDER — PROMETHAZINE HCL 25 MG/ML IJ SOLN
12.5000 mg | INTRAMUSCULAR | Status: DC | PRN
  Administered 2011-04-07 – 2011-04-10 (×13): 12.5 mg via INTRAVENOUS
  Filled 2011-04-07 (×13): qty 1

## 2011-04-07 MED ORDER — HYDROMORPHONE HCL PF 2 MG/ML IJ SOLN
0.5000 mg | INTRAMUSCULAR | Status: DC | PRN
  Administered 2011-04-07 – 2011-04-10 (×29): 1 mg via INTRAVENOUS
  Administered 2011-04-10: 0.5 mg via INTRAVENOUS
  Filled 2011-04-07 (×30): qty 1

## 2011-04-07 MED ORDER — DIPHENHYDRAMINE HCL 50 MG/ML IJ SOLN
12.5000 mg | Freq: Four times a day (QID) | INTRAMUSCULAR | Status: DC | PRN
  Administered 2011-04-07 (×2): 12.5 mg via INTRAVENOUS
  Administered 2011-04-08: 50 mg via INTRAVENOUS
  Administered 2011-04-08 – 2011-04-10 (×7): 12.5 mg via INTRAVENOUS
  Filled 2011-04-07 (×11): qty 1

## 2011-04-07 NOTE — Progress Notes (Addendum)
Patient Event   Patient c/o vomitus after eating jello 100cc in basin, Nurse assess the emesis and found it to be in the  Color of red jello but looked more like water mix with jello and not vomitus alone.

## 2011-04-07 NOTE — Progress Notes (Signed)
Patient ID: Loretta Alexander, female   DOB: 10-23-1980, 30 y.o.   MRN: [id number] Subjective: The patient vomited last evening.  Objective: Vital signs in last 24 hours: Temp:  [98 F (36.7 C)-98.5 F (36.9 C)] 98 F (36.7 C) (11/29 2120) Pulse Rate:  [97-98] 97  (11/29 2120) Resp:  [18] 18  (11/29 2120) BP: (93-107)/(64-66) 93/64 mmHg (11/29 2120) SpO2:  [94 %-98 %] 94 % (11/29 2120) Last BM Date: 04/04/11 (per pt)  Intake/Output from previous day: 11/29 0701 - 11/30 0700 In: 240 [P.O.:240] Out: -  Intake/Output this shift:    General appearance: alert and mild distress GI: tender in the LUQ  Lab Results: No results found for this basename: WBC:3,HGB:3,HCT:3,PLT:3 in the last 72 hours BMET No results found for this basename: NA:3,K:3,CL:3,CO2:3,GLUCOSE:3,BUN:3,CREATININE:3,CALCIUM:3 in the last 72 hours LFT No results found for this basename: PROT,ALBUMIN,AST,ALT,ALKPHOS,BILITOT,BILIDIR,IBILI in the last 72 hours PT/INR No results found for this basename: LABPROT:2,INR:2 in the last 72 hours Hepatitis Panel No results found for this basename: HEPBSAG,HCVAB,HEPAIGM,HEPBIGM in the last 72 hours C-Diff No results found for this basename: CDIFFTOX:3 in the last 72 hours Fecal Lactopherrin No results found for this basename: FECLLACTOFRN in the last 72 hours  Studies/Results: No results found.  Medications: I have reviewed the patient's current medications.  Assessment/Plan: 1) Mild esophagitis. 2) Costochondritis   She is not sure if she has any noticeable improvement at this time with the medrol dose pack.  She complains that the pain medications are not lasting long enough for her.  Plan: 1) Continue with the medrol dose pack. 2) I will defer the decision for more pain medications to [facility].  LOS: 4 days   Billijo Dilling D 04/07/2011, 6:47 AM

## 2011-04-07 NOTE — Progress Notes (Signed)
Patient Event   Patient requested chocolate ice cream 2 cups and tolerated them well without any vomitus after eating. Dr. Truitt Merle

## 2011-04-07 NOTE — Progress Notes (Signed)
Patient ID: Loretta Alexander, female   DOB: 05/02/1981, 30 y.o.   MRN: [id number] 2 Days Post-Op  Subjective: Pt still c/o pain and nausea.  RNs report to me that patient tolerated ice cream yesterday without any problems but pt tells me today that she threw it up.  There are no documented episodes of emesis yesterday.  When asked how she was tolerating her clears, she states she's not, but then says she tolerates water and drink.  She doesn't "have the appetite" for chicken broth.  States the steroid dose pack isn't helping either.  Had a long d/w patient about progression of care and that continuing with the current plan is not working as we have been trying this for 4 days without any change according to the patient.  I spoke with her about trying to add po pain medicine since her biggest complaint is that the morphine does not last long enough.  She was very hesitant about this and says that it doesn't work for her.  She requested Dilaudid to be given to her, instead of morphine.  I told her I would only give her 0.5-1mg .  She seemed displeased with this and felt that this was the same amount essentially of morphine that I had been giving her.  I told her with all the other medicines she's on currently, I did not feel that she needed more.  Patient currently taking as often as possible, benadryl, phenergan, and IV pain meds.  Objective: Vital signs in last 24 hours: Temp:  [98 F (36.7 C)-98.5 F (36.9 C)] 98.2 F (36.8 C) (11/30 0530) Pulse Rate:  [95-98] 95  (11/30 0530) Resp:  [18] 18  (11/30 0530) BP: (93-107)/(64-68) 101/68 mmHg (11/30 0530) SpO2:  [94 %-98 %] 95 % (11/30 0530) Last BM Date: 04/04/11 (per pt)  Intake/Output from previous day: 11/29 0701 - 11/30 0700 In: 240 [P.O.:240] Out: -  Intake/Output this shift:    PE: Abd: soft, essentially nontender. +BS, ND HT: regular Lungs: CTAB  Lab Results:  No results found for this basename: WBC:2,HGB:2,HCT:2,PLT:2 in the last 72  hours BMET No results found for this basename: NA:2,K:2,CL:2,CO2:2,GLUCOSE:2,BUN:2,CREATININE:2,CALCIUM:2 in the last 72 hours PT/INR No results found for this basename: LABPROT:2,INR:2 in the last 72 hours   Studies/Results: No results found.  Anti-infectives: Anti-infectives    None       Assessment/Plan  1. abd pain with n/v, gastritis and esophagitis on endo  Plan: I have asked the nurses to document exactly what and how much patient is able to tolerate PO as well as how much or how often she has emesis.  According the nurses, it does not appear the patient is throwing up at all or near as much as she says she is.  Cont on clear liquids.  Will change her meds to percocet and try this and give dilaudid 0.5-1mg  only if percocet brings no relief.  Obvious concern is that percocet may cause some nausea without having solid food on her stomach.  Will change phenergan to po and allow her to take that with percocet to prevent nausea.  Once pt is able to tolerate clear liquids, she can be discharged and allowed to adv her diet on her own as symptoms improve.   LOS: 4 days    Loretta Alexander E 04/07/2011

## 2011-04-07 NOTE — Progress Notes (Signed)
Difficult situation to evaluate.  Pt examined.  Seen Ms slowly a little better.  Continue current Rx for now  Mariella Saa MD, FACS  04/07/2011, 2:37 PM

## 2011-04-08 DIAGNOSIS — R1012 Left upper quadrant pain: Secondary | ICD-10-CM

## 2011-04-08 LAB — COMPREHENSIVE METABOLIC PANEL
Alkaline Phosphatase: 74 U/L (ref 39–117)
BUN: 3 mg/dL — ABNORMAL LOW (ref 6–23)
Chloride: 103 mEq/L (ref 96–112)
GFR calc Af Amer: >90 mL/min (ref >90)
GFR calc non Af Amer: >90 mL/min (ref >90)
Glucose, Bld: 156 mg/dL — ABNORMAL HIGH (ref 70–99)
Potassium: 3.8 mEq/L (ref 3.5–5.1)
Total Bilirubin: 0.3 mg/dL (ref 0.3–1.2)

## 2011-04-08 NOTE — Progress Notes (Signed)
Subjective: **She continues to complain of pain in the left upper quadrant. Pain is worsened with eating.*  Objective: Vital signs in last 24 hours: Temp:  [98.2 F (36.8 C)-98.7 F (37.1 C)] 98.5 F (36.9 C) (12/01 0550) Pulse Rate:  [97-112] 99  (12/01 0550) Resp:  [18] 18  (12/01 0550) BP: (95-107)/(63-67) 99/63 mmHg (12/01 0550) SpO2:  [95 %-99 %] 99 % (12/01 0550) Last BM Date: 05/04/11 General:   Alert,  Well-developed, well-nourished, pleasant and cooperative in NAD Head:  Normocephalic and atraumatic. Eyes:  Sclera clear, no icterus.   Conjunctiva pink. Mouth:  No deformity or lesions, dentition normal. Neck:  Supple; no masses or thyromegaly. Heart:  Regular rate and rhythm; no murmurs, clicks, rubs,  or gallops. Abdomen:  Soft,and nondistended. No masses, there is moderate tenderness to palpation left upper quadrant.  Msk:  Symmetrical without gross deformities. Normal posture. Pulses:  Normal pulses noted. Extremities:  Without clubbing or edema. Neurologic:  Alert and  oriented x4;  grossly normal neurologically. Skin:  Intact without significant lesions or rashes. Cervical Nodes:  No significant cervical adenopathy. Psych:  Alert and cooperative. Normal mood and affect.  Intake/Output from previous day: 11/30 0701 - 12/01 0700 In: 1440 [P.O.:240; I.V.:1200] Out: -  Intake/Output this shift:    Lab Results: No results found for this basename: WBC:3,HGB:3,HCT:3,PLT:3 in the last 72 hours BMET No results found for this basename: NA:3,K:3,CL:3,CO2:3,GLUCOSE:3,BUN:3,CREATININE:3,CALCIUM:3 in the last 72 hours LFT No results found for this basename: PROT,ALBUMIN,AST,ALT,ALKPHOS,BILITOT,BILIDIR,IBILI in the last 72 hours PT/INR No results found for this basename: LABPROT:2,INR:2 in the last 72 hours Hepatitis Panel No results found for this basename: HEPBSAG,HCVAB,HEPAIGM,HEPBIGM in the last 72 hours C-Diff PCR   Studies/Results: No results  found.  Assessment: *Persistent left upper quadrant pain. Etiology is uncertain. He doesn't appear to have had a response to steroids. Of note is a foreign body in the abdominal wall seen by CT scan. This is of questionable significance.  Recommendations #1 continue current regimen. I will bring the CT findings to the attention of  surgery**        Robert D. Arlyce Dice, MD, [facility] [phone]   Melvia Heaps  04/08/2011, 10:39 AM
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
## 2011-04-09 ENCOUNTER — Inpatient Hospital Stay (HOSPITAL_COMMUNITY): Payer: BC Managed Care – PPO

## 2011-04-09 DIAGNOSIS — R1012 Left upper quadrant pain: Secondary | ICD-10-CM

## 2011-04-09 LAB — CBC
Platelets: 370 10*3/uL (ref 150–400)
RDW: 13.1 % (ref 11.5–15.5)
WBC: 7.4 10*3/uL (ref 4.0–10.5)

## 2011-04-09 LAB — DIFFERENTIAL
Basophils Absolute: 0 10*3/uL (ref 0.0–0.1)
Lymphocytes Relative: 28 % (ref 12–46)
Neutro Abs: 4.3 10*3/uL (ref 1.7–7.7)

## 2011-04-09 MED ORDER — METOCLOPRAMIDE HCL 10 MG PO TABS
10.0000 mg | ORAL_TABLET | Freq: Three times a day (TID) | ORAL | Status: DC
  Filled 2011-04-09 (×4): qty 1

## 2011-04-09 NOTE — Progress Notes (Signed)
Subjective: *She continues to complain of severe left upper quadrant pain and immediate postprandial nausea and vomiting. Pain is not affected by eating.**  Objective: Vital signs in last 24 hours: Temp:  [97.7 F (36.5 C)-99.2 F (37.3 C)] 99.2 F (37.3 C) (12/02 0600) Pulse Rate:  [95-105] 95  (12/02 0600) Resp:  [18-20] 18  (12/02 0600) BP: (97-104)/(59-67) 104/59 mmHg (12/02 0600) SpO2:  [94 %-98 %] 95 % (12/02 0600) Last BM Date: 04/07/11 General:   Alert,  Well-developed, well-nourished, pleasant and cooperative in NAD Abdomen-soft. There is tenderness in the left subcostal area, inferior to the costal margin and above the ribs as well. The guarding rebound.Marland Kitchen Psych:  Alert and cooperative. Normal mood and affect.  Intake/Output from previous day: 12/01 0701 - 12/02 0700 In: -  Out: 2675 [Urine:950; Emesis/NG output:1725] Intake/Output this shift: Total I/O In: -  Out: 300 [Emesis/NG output:300]  Lab Results:  Basename 04/09/11 0600  WBC 7.4  HGB 12.4  HCT 36.5  PLT 370   BMET  Basename 04/08/11 1405  NA 136  K 3.8  CL 103  CO2 24  GLUCOSE 156*  BUN 3*  CREATININE 0.59  CALCIUM 9.3   LFT  Basename 04/08/11 1405  PROT 6.9  ALBUMIN 3.6  AST 26  ALT 46*  ALKPHOS 74  BILITOT 0.3  BILIDIR --  IBILI --   PT/INR No results found for this basename: LABPROT:2,INR:2 in the last 72 hours Hepatitis Panel No results found for this basename: HEPBSAG,HCVAB,HEPAIGM,HEPBIGM in the last 72 hours   Studies/Results: No results found.  Assessment: **Persistent abdominal pain with nausea and vomiting. Etiology is not certain. Symptoms are certainly out of proportion to her physical findings. Narcotics may be contributing to her nausea. She clearly has not responded to a Medrol dosepak,  given for possible musculoskeletal pain.*  Recommendations #1 DC Medrol Dosepak #2 empiric trial of Reglan 10 mg one half hour a.c. and at bedtime         Barbette Hair.  Arlyce Dice, MD, [facility] [place] [facility] [phone]   Melvia Heaps  04/09/2011, 9:48 AM

## 2011-04-09 NOTE — Progress Notes (Signed)
4 Days Post-Op  Subjective: Patient states she is still vomiting. She says she still has pain. Nurses have a documented 1700 cc of emesis,but none of the nursing staff had actually witnessed her vomiting. The nursing staff reported to me that they think that she is mixing water with Coca-Cola as it does not appear to be any saliva or mucus in the fluid.. When they confronted her with this she cried.. Urine output is good. Vital signs are normal. Lab work is normal.  Dr. Arlyce Dice has seen her and is uncertain as to the cause of her symptoms. He has discontinued her Medrol Dosepak and started her on Reglan.  Objective: Vital signs in last 24 hours: Temp:  [97.7 F (36.5 C)-99.2 F (37.3 C)] 99.2 F (37.3 C) 2023-04-13 0600) Pulse Rate:  [95-105] 95  2023/04/13 0600) Resp:  [18-20] 18  04-13-2023 0600) BP: (97-104)/(59-67) 104/59 mmHg 04/13/2023 0600) SpO2:  [94 %-98 %] 95 % 04-13-2023 0600) Last BM Date: 04/07/11  Intake/Output from previous day: 12/01 0701 - Apr 13, 2023 0700 In: -  Out: 2675 [Urine:950; Emesis/NG output:1725] Intake/Output this shift: Total I/O In: -  Out: 300 [Emesis/NG output:300]  GI: abdomen is obese but generally soft. I do not detect any distention or tympany.  Bowel sounds are hypoactive. All of her incisions are well healed. There are no hernias and there are no masses. There is no localized tenderness, just a subjective discomfort everywhere.  Lab Results:   Basename 04/13/2011 0600  WBC 7.4  HGB 12.4  HCT 36.5  PLT 370   BMET  Basename 04/08/11 1405  NA 136  K 3.8  CL 103  CO2 24  GLUCOSE 156*  BUN 3*  CREATININE 0.59  CALCIUM 9.3   PT/INR No results found for this basename: LABPROT:2,INR:2 in the last 72 hours ABG No results found for this basename: PHART:2,PCO2:2,PO2:2,HCO3:2 in the last 72 hours  Studies/Results: Dg Abd 2 Views  Apr 13, 2011  *RADIOLOGY REPORT*  Clinical Data: Vomiting, abdominal pain.  ABDOMEN - 2 VIEW  Comparison: 10/14/2010  Findings: Prior  cholecystectomy.  IUD is in place.  Moderate stool burden throughout the colon.  Gas within mildly distended sigmoid colon.  Slight distention of upper abdominal small bowel loops. Nonspecific bowel gas pattern.  No free air organomegaly.  IMPRESSION: Nonspecific bowel gas pattern without definite evidence of obstruction.  Moderate stool burden.  Original Report Authenticated By: Cyndie Chime, M.D.    Anti-infectives: Anti-infectives    None      Assessment/Plan: s/p Procedure(s): ESOPHAGOGASTRODUODENOSCOPY (EGD)  Nausea, vomiting, abdominal pain of uncertain etiology. None of the emesis is documented. This may be due to the narcotic and sedative medication she is taking.  Mild esophagitis and mild gastritis on endoscopy, but I am not sure that this could be the cause of her symptoms.  I am concerned that there may not be an organic etiology for her symptoms. There certainly does not appear to be any surgical disease process identified.  I told the patient that there is no indication for surgical intervention and I did not understand her symptoms. I did not change any of her medications her treatments today.  Consider general internal medicine consult to see if there is any other potential cause for her symptoms.    LOS: 6 days    Carmen Tolliver M 13-Apr-2011

## 2011-04-10 LAB — CREATININE, SERUM: Creatinine, Ser: 0.55 mg/dL (ref 0.50–1.10)

## 2011-04-10 MED ORDER — PROMETHAZINE HCL 25 MG PO TABS
12.5000 mg | ORAL_TABLET | Freq: Four times a day (QID) | ORAL | Status: DC | PRN
  Administered 2011-04-10 – 2011-04-11 (×2): 25 mg via ORAL
  Filled 2011-04-10 (×3): qty 1

## 2011-04-10 NOTE — Progress Notes (Signed)
Patient stated, that the ice cream she had eaten stayed down which means she did not have any vomitus.

## 2011-04-10 NOTE — Progress Notes (Signed)
Patient ID: Loretta Alexander, female   DOB: [birth year], 30 y.o.   MRN: [id number] 5 Days Post-Op  Subjective: Pt still c/o can't keep anything down.  Gets defensive when questioned about emesis.  Pt states that she does throw up ice cream and that the RNs are incorrect when they say that she is keeping it down.  She also gets defensive when we discuss changing IV meds to PO meds.  Objective: Vital signs in last 24 hours: Temp:  [98.3 F (36.8 C)-98.7 F (37.1 C)] 98.3 F (36.8 C) (12/03 0600) Pulse Rate:  [89-101] 89  (12/03 0600) Resp:  [18-20] 20  (12/03 0600) BP: (95-113)/(60-67) 95/60 mmHg (12/03 0600) SpO2:  [96 %-98 %] 96 % (12/03 0600) Last BM Date: 04/07/11  Intake/Output from previous day: 05/04/23 0701 - 12/03 0700 In: -  Out: 3100 [Urine:2000; Emesis/NG output:1100] Intake/Output this shift:    PE: Abd: soft, essentially NT, obese, +BS  Lab Results:   Basename 05/04/2011 0600  WBC 7.4  HGB 12.4  HCT 36.5  PLT 370   BMET  Basename 04/08/11 1405  NA 136  K 3.8  CL 103  CO2 24  GLUCOSE 156*  BUN 3*  CREATININE 0.59  CALCIUM 9.3   PT/INR No results found for this basename: LABPROT:2,INR:2 in the last 72 hours   Studies/Results: Dg Abd 2 Views  04-May-2011  *RADIOLOGY REPORT*  Clinical Data: Vomiting, abdominal pain.  ABDOMEN - 2 VIEW  Comparison: 10/14/2010  Findings: Prior cholecystectomy.  IUD is in place.  Moderate stool burden throughout the colon.  Gas within mildly distended sigmoid colon.  Slight distention of upper abdominal small bowel loops. Nonspecific bowel gas pattern.  No free air organomegaly.  IMPRESSION: Nonspecific bowel gas pattern without definite evidence of obstruction.  Moderate stool burden.  Original Report Authenticated By: Cyndie Chime, M.D.    Anti-infectives: Anti-infectives    None       Assessment/Plan  1. abd pain, N/V, unknown etiology...suspect this is not an organic etiology.  Plan: Will change all IV meds to po  today.   Would like to try and progress patient towards discharge.  She still states she is no better, but then when IV pain meds are going to be d/c she then states "well, they do help. They just don't last long enough."  I told her that po meds typically control symptoms for a longer period of time so we are going to try those.  LOS: 7 days    Loretta Alexander 04/10/2011

## 2011-04-10 NOTE — Progress Notes (Signed)
Patient has been eating chocolate and strawberry ice cream also drinking CoCa drinks without any signs of nausea or vomitus

## 2011-04-10 NOTE — Progress Notes (Signed)
Patient ID: Loretta Alexander, female   DOB: 11-29-1980, 30 y.o.   MRN: [id number] Subjective: She still continues to complain of nausea, vomiting, and abdominal pain.  Objective: Vital signs in last 24 hours: Temp:  [98.3 F (36.8 C)-98.7 F (37.1 C)] 98.3 F (36.8 C) (12/03 0600) Pulse Rate:  [89-101] 89  (12/03 0600) Resp:  [18-20] 20  (12/03 0600) BP: (95-113)/(60-67) 95/60 mmHg (12/03 0600) SpO2:  [96 %-98 %] 96 % (12/03 0600) Last BM Date: 04/07/11  Intake/Output from previous day: April 22, 2023 0701 - 12/03 0700 In: -  Out: 3100 [Urine:2000; Emesis/NG output:1100] Intake/Output this shift:    General appearance: fatigued appearing GI: obese, no overt tenderness  Lab Results:  Basename Apr 22, 2011 0600  WBC 7.4  HGB 12.4  HCT 36.5  PLT 370   BMET  Basename 04/10/11 0954 04/08/11 1405  NA -- 136  K -- 3.8  CL -- 103  CO2 -- 24  GLUCOSE -- 156*  BUN -- 3*  CREATININE 0.55 0.59  CALCIUM -- 9.3   LFT  Basename 04/08/11 1405  PROT 6.9  ALBUMIN 3.6  AST 26  ALT 46*  ALKPHOS 74  BILITOT 0.3  BILIDIR --  IBILI --   PT/INR No results found for this basename: LABPROT:2,INR:2 in the last 72 hours Hepatitis Panel No results found for this basename: HEPBSAG,HCVAB,HEPAIGM,HEPBIGM in the last 72 hours C-Diff No results found for this basename: CDIFFTOX:3 in the last 72 hours Fecal Lactopherrin No results found for this basename: FECLLACTOFRN in the last 72 hours  Studies/Results: Dg Abd 2 Views  2011/04/22  *RADIOLOGY REPORT*  Clinical Data: Vomiting, abdominal pain.  ABDOMEN - 2 VIEW  Comparison: 10/14/2010  Findings: Prior cholecystectomy.  IUD is in place.  Moderate stool burden throughout the colon.  Gas within mildly distended sigmoid colon.  Slight distention of upper abdominal small bowel loops. Nonspecific bowel gas pattern.  No free air organomegaly.  IMPRESSION: Nonspecific bowel gas pattern without definite evidence of obstruction.  Moderate stool burden.   Original Report Authenticated By: Cyndie Chime, M.D.    Medications: I have reviewed the patient's current medications.  Assessment/Plan: 1) LUQ pain 2) Nausea/Vomiting   I agree with surgery.  It will be beneficial to get her off of IV meds.  I do not know the etiology of the symptoms.  No overt response with the use of Reglan.  Plan: 1) Pain medications per surgery. 2) Continue with Reglan for now.  No gross neurologic side effects at this time.  LOS: 7 days   Loretta Alexander D 04/10/2011, 1:09 PM

## 2011-04-10 NOTE — Progress Notes (Signed)
Po meds.  Plan d/c hopefully tomorrow.

## 2011-04-11 MED ORDER — PROMETHAZINE HCL 12.5 MG PO TABS: 12.5000 mg | ORAL_TABLET | Freq: Four times a day (QID) | ORAL | Status: DC | PRN

## 2011-04-11 MED ORDER — ONDANSETRON HCL 8 MG PO TABS: 8.0000 mg | ORAL_TABLET | Freq: Three times a day (TID) | ORAL | Status: AC | PRN

## 2011-04-11 MED ORDER — OXYCODONE-ACETAMINOPHEN 5-325 MG PO TABS: 1.0000 | ORAL_TABLET | ORAL | Status: AC | PRN

## 2011-04-11 NOTE — Progress Notes (Signed)
Patient ID: Loretta Alexander, female   DOB: April 07, 1981, 30 y.o.   MRN: [id number] 6 Days Post-Op  Subjective: Pt states she feels a little better.  Was able to keep ice cream and coke down.  Feels like she is ready to go home.  Objective: Vital signs in last 24 hours: Temp:  [98.1 F (36.7 C)-98.3 F (36.8 C)] 98.3 F (36.8 C) (12/04 0504) Pulse Rate:  [80-113] 113  (12/04 0504) Resp:  [18] 18  (12/04 0504) BP: (93-113)/(60-74) 113/70 mmHg (12/04 0504) SpO2:  [95 %-100 %] 100 % (12/04 0504) Last BM Date: 04/10/11  Intake/Output from previous day: 12/03 0701 - 12/04 0700 In: 2950 [P.O.:600; I.V.:2350] Out: -  Intake/Output this shift:    PE: Abd: soft, minimally tender in LUQ, +BS, ND  Lab Results:   Basename 04/09/11 0600  WBC 7.4  HGB 12.4  HCT 36.5  PLT 370   BMET  Basename 04/10/11 0954 04/08/11 1405  NA -- 136  K -- 3.8  CL -- 103  CO2 -- 24  GLUCOSE -- 156*  BUN -- 3*  CREATININE 0.55 0.59  CALCIUM -- 9.3   PT/INR No results found for this basename: LABPROT:2,INR:2 in the last 72 hours   Studies/Results: No results found.  Anti-infectives: Anti-infectives    None       Assessment/Plan  1. abd pain, N/V, improved  Plan: Ok to d/c home   LOS: 8 days    Vicki Pasqual E 04/11/2011

## 2011-04-11 NOTE — Discharge Summary (Signed)
Patient ID: ANALUCIA HUSH MRN: [id number] DOB/AGE: Jan 19, 1981 30 y.o.  Admit date: 04/03/2011 Discharge date: 04/11/2011  Procedures: upper endoscopy  Consults: GI; Dr. Elnoria Howard  Reason for Admission: The patient presented to [facility] with c/o of LUQ abdominal pain along with n/v.  She was found to have a FB on CT scan which ended up being remaining tubing from lap band.  She was ultimately admitted secondary to uncontrolled pain, n, v.  Admission Diagnoses: 1. Abdominal pain 2. Nausea/vomiting 3. Foreign body in abd  Hospital Course: The patient was admitted and placed on BID protonix.  She was placed on prn medications for pain and nausea.  After several days without improvement, GI was consulted for further evaluation as her CT scan was otherwise negative for any source.  Her endoscopy showed some mild gastritis.  She was placed on clear liquids, which the patient states she continued to have emesis.  For several days this continued, despite RN documentation that the patient was actually not throwing up and that it was fabricated.  She ultimately, was changed to po medications.  She was able to tolerate ice cream and several different liquids.  She was finally felt stable for discharge home and informed that she needs to drink supplements at home while slowly advancing her diet as she tolerates.  Discharge Diagnoses:  Principal Problem:  *Gastritis Active Problems:  Abdominal pain  Foreign body  Nausea and vomiting   Discharge Medications: Current Discharge Medication List    START taking these medications   Details  ondansetron (ZOFRAN) 8 MG tablet Take 1 tablet (8 mg total) by mouth every 8 (eight) hours as needed for nausea. Qty: 30 tablet, Refills: 0    oxyCODONE-acetaminophen (PERCOCET) 5-325 MG per tablet Take 1-2 tablets by mouth every 4 (four) hours as needed. Qty: 30 tablet, Refills: 0    promethazine (PHENERGAN) 12.5 MG tablet Take 1-2 tablets (12.5-25 mg total) by mouth  every 6 (six) hours as needed. Qty: 30 tablet, Refills: 0      CONTINUE these medications which have NOT CHANGED   Details  DULoxetine (CYMBALTA) 60 MG capsule Take 60 mg by mouth daily.      OVER THE COUNTER MEDICATION Take 1 tablet by mouth daily. Vitamin c    QUEtiapine (SEROQUEL) 25 MG tablet Take 25 mg by mouth at bedtime as needed. HEADACHE        Discharge Instructions: Follow-up Information    Follow up with [facility] (As needed)    Contact information:   [address] [employer], [place]. [place] [address] [phone]          Signed: Letha Cape 04/11/2011, 9:27 AM

## 2011-04-11 NOTE — Progress Notes (Signed)
D/C home. Follow up with bariatric surgeon PRN

## 2011-04-11 NOTE — Progress Notes (Signed)
Pt d/c'd home no changes since am assessment. Pt verbalized understanding of all discharge paperwork which was given. Pt wheeled by NT I to front entrance to leave. Pt had no further questions about condition.  Horatio Pel 04/11/2011

## 2011-04-11 NOTE — Discharge Summary (Signed)
Agree with above 

## 2011-07-04 ENCOUNTER — Emergency Department: Payer: Self-pay | Admitting: Emergency Medicine

## 2011-07-05 ENCOUNTER — Emergency Department: Payer: Self-pay | Admitting: *Deleted

## 2011-07-06 ENCOUNTER — Emergency Department: Payer: Self-pay | Admitting: Internal Medicine

## 2011-07-06 LAB — CBC
HCT: 40.2 % (ref 35.0–47.0)
HGB: 13.4 g/dL (ref 12.0–16.0)
MCH: 30.3 pg (ref 26.0–34.0)
MCHC: 33.3 g/dL (ref 32.0–36.0)
MCV: 91 fL (ref 80–100)
Platelet: 363 10*3/uL (ref 150–440)
RBC: 4.41 10*6/uL (ref 3.80–5.20)
RDW: 14.1 % (ref 11.5–14.5)
WBC: 11.7 10*3/uL — ABNORMAL HIGH (ref 3.6–11.0)

## 2011-07-06 LAB — BASIC METABOLIC PANEL
Anion Gap: 13 (ref 7–16)
BUN: 10 mg/dL (ref 7–18)
Calcium, Total: 9.2 mg/dL (ref 8.5–10.1)
Chloride: 108 mmol/L — ABNORMAL HIGH (ref 98–107)
Co2: 26 mmol/L (ref 21–32)
Creatinine: 0.74 mg/dL (ref 0.60–1.30)
EGFR (African American): >60
EGFR (Non-African Amer.): >60
Glucose: 89 mg/dL (ref 65–99)
Osmolality: 291 (ref 275–301)
Potassium: 3.8 mmol/L (ref 3.5–5.1)
Sodium: 147 mmol/L — ABNORMAL HIGH (ref 136–145)

## 2011-07-06 LAB — URINALYSIS, COMPLETE
Bilirubin,UR: NEGATIVE
Blood: NEGATIVE
Glucose,UR: NEGATIVE mg/dL (ref 0–75)
Ketone: NEGATIVE
Nitrite: NEGATIVE
Ph: 5 (ref 4.5–8.0)
Protein: NEGATIVE
RBC,UR: 1 /HPF (ref 0–5)
Specific Gravity: 1.018 (ref 1.003–1.030)
Squamous Epithelial: 13
WBC UR: 5 /HPF (ref 0–5)

## 2011-07-06 LAB — PREGNANCY, URINE: Pregnancy Test, Urine: NEGATIVE m[IU]/mL

## 2011-07-07 ENCOUNTER — Emergency Department (HOSPITAL_COMMUNITY)
Admission: EM | Admit: 2011-07-07 | Discharge: 2011-07-07 | Disposition: A | Discharge status: Home / Self Care | Payer: BC Managed Care – PPO | Attending: Emergency Medicine | Admitting: Emergency Medicine

## 2011-07-07 DIAGNOSIS — G43909 Migraine, unspecified, not intractable, without status migrainosus: Secondary | ICD-10-CM | POA: Insufficient documentation

## 2011-07-07 DIAGNOSIS — R112 Nausea with vomiting, unspecified: Secondary | ICD-10-CM | POA: Insufficient documentation

## 2011-07-07 DIAGNOSIS — H53149 Visual discomfort, unspecified: Secondary | ICD-10-CM | POA: Insufficient documentation

## 2011-07-07 DIAGNOSIS — F3289 Other specified depressive episodes: Secondary | ICD-10-CM | POA: Insufficient documentation

## 2011-07-07 DIAGNOSIS — F329 Major depressive disorder, single episode, unspecified: Secondary | ICD-10-CM | POA: Insufficient documentation

## 2011-07-07 DIAGNOSIS — Z79899 Other long term (current) drug therapy: Secondary | ICD-10-CM | POA: Insufficient documentation

## 2011-07-07 DIAGNOSIS — R630 Anorexia: Secondary | ICD-10-CM | POA: Insufficient documentation

## 2011-07-07 MED ORDER — PROMETHAZINE HCL 25 MG/ML IJ SOLN
25.0000 mg | Freq: Once | INTRAMUSCULAR | Status: AC
  Administered 2011-07-07: 25 mg via INTRAVENOUS
  Filled 2011-07-07: qty 1

## 2011-07-07 MED ORDER — DIPHENHYDRAMINE HCL 50 MG/ML IJ SOLN
25.0000 mg | Freq: Once | INTRAMUSCULAR | Status: AC
  Administered 2011-07-07: 12:00:00 via INTRAVENOUS
  Filled 2011-07-07: qty 1

## 2011-07-07 MED ORDER — HYDROMORPHONE HCL PF 1 MG/ML IJ SOLN
1.0000 mg | Freq: Once | INTRAMUSCULAR | Status: AC
  Administered 2011-07-07: 1 mg via INTRAVENOUS
  Filled 2011-07-07: qty 1

## 2011-07-07 MED ORDER — SUMATRIPTAN SUCCINATE 6 MG/0.5ML ~~LOC~~ SOLN
6.0000 mg | Freq: Once | SUBCUTANEOUS | Status: AC
  Administered 2011-07-07: 6 mg via SUBCUTANEOUS
  Filled 2011-07-07: qty 0.5

## 2011-07-07 MED ORDER — ONDANSETRON HCL 4 MG/2ML IJ SOLN
4.0000 mg | Freq: Once | INTRAMUSCULAR | Status: AC
  Administered 2011-07-07: 4 mg via INTRAVENOUS
  Filled 2011-07-07: qty 2

## 2011-07-07 MED ORDER — DIAZEPAM 5 MG PO TABS
10.0000 mg | ORAL_TABLET | Freq: Once | ORAL | Status: AC
  Administered 2011-07-07: 10 mg via ORAL
  Filled 2011-07-07 (×2): qty 1

## 2011-07-07 MED ORDER — SODIUM CHLORIDE 0.9 % IV BOLUS (SEPSIS)
1000.0000 mL | Freq: Once | INTRAVENOUS | Status: AC
  Administered 2011-07-07: 1000 mL via INTRAVENOUS

## 2011-07-07 MED ORDER — DIPHENHYDRAMINE HCL 50 MG/ML IJ SOLN
25.0000 mg | Freq: Once | INTRAMUSCULAR | Status: AC
  Administered 2011-07-07: 25 mg via INTRAVENOUS
  Filled 2011-07-07: qty 1

## 2011-07-07 MED ORDER — KETOROLAC TROMETHAMINE 30 MG/ML IJ SOLN
30.0000 mg | Freq: Once | INTRAMUSCULAR | Status: AC
  Administered 2011-07-07: 30 mg via INTRAVENOUS
  Filled 2011-07-07: qty 1

## 2011-07-07 NOTE — ED Provider Notes (Signed)
History     CSN: [id number]  Arrival date & time 07/07/11  1000   First MD Initiated Contact with Patient 07/07/11 1024      Chief Complaint  Patient presents with  . Headache    Pt has a history of migrains and does not have a neuro in her town so came here, she is also vomiting    (Consider location/radiation/quality/duration/timing/severity/associated sxs/prior treatment) HPI Comments: Patient has a history of chronic migraines. She states she's had cluster migraines like this in the past. She's been seen at another emergency room 3 times in the last week and received IV medications which initially improved her pain in the headache returns. She states) headaches start she gets in or out in the left eye and left-sided tingling which resolves when the headache begins.  Patient is a 31 y.o. female presenting with headaches. The history is provided by the patient.  Headache  This is a chronic problem. Episode onset: 1 week ago. The problem occurs constantly. The problem has not changed since onset.The headache is associated with bright light, activity and loud noise. The pain is located in the left unilateral region. The quality of the pain is described as sharp and throbbing. The pain is at a severity of 9/10. The pain is severe. The pain does not radiate. Associated symptoms include anorexia, nausea and vomiting. Pertinent negatives include no fever. She has tried injectable narcotic analgesics, resting in a darkened room, NSAIDs and oral narcotic analgesics for the symptoms. The treatment provided no relief.    Past Medical History  Diagnosis Date  . Migraines   . Pneumonia     04/2010  . Eye infection     within last 2 weeks  . Depression   . Complication of anesthesia     Difficult IV stick with Central line placement    Past Surgical History  Procedure Date  . Tonsillectomy 1991  . Appendectomy 1992  . Cholecystectomy 2003  . Right ovary removed 2006  . Abdominal adhesion  surgery 2006  . Knee arthroscopy 2001, 2010    left knee x 2  . Hernia repair     umbilical  . Nasal septoplasty w/ turbinoplasty 03/16/2011    Procedure: NASAL SEPTOPLASTY WITH TURBINATE REDUCTION;  Surgeon: Susy Frizzle, MD;  Location: [facility] OR;  Service: ENT;  Laterality: Bilateral;  . Laparoscopic gastric banding   . Removal of lap band   . Pilonidal cyst excision   . Esophagogastroduodenoscopy 04/05/2011    Procedure: ESOPHAGOGASTRODUODENOSCOPY (EGD);  Surgeon: Charna Elizabeth, MD;  Location: [facility] ENDOSCOPY;  Service: Endoscopy;  Laterality: N/A;    No family history on file.  History  Substance Use Topics  . Smoking status: Never Smoker   . Smokeless tobacco: Never Used  . Alcohol Use: No    OB History    Grav Para Term Preterm Abortions TAB SAB Ect Mult Living                  Review of Systems  Constitutional: Negative for fever.  Gastrointestinal: Positive for nausea, vomiting and anorexia.  Neurological: Positive for headaches.  All other systems reviewed and are negative.    Allergies  Toradol; Compazine; Haloperidol and related; and Reglan  Home Medications   Current Outpatient Rx  Name Route Sig Dispense Refill  . DULOXETINE HCL 60 MG PO CPEP Oral Take 60 mg by mouth daily.      . OXYCODONE-ACETAMINOPHEN 5-325 MG PO TABS Oral Take 1 tablet  by mouth every 4 (four) hours as needed. For pain.    Marland Kitchen PROMETHAZINE HCL 12.5 MG RE SUPP Rectal Place 12.5 mg rectally every 6 (six) hours as needed. For nausea.    Marland Kitchen QUETIAPINE FUMARATE 25 MG PO TABS Oral Take 25 mg by mouth at bedtime as needed. For sleep.      BP 115/65  Pulse 80  Temp(Src) 98.3 F (36.8 C) (Oral)  Physical Exam  Nursing note and vitals reviewed. Constitutional: She is oriented to person, place, and time. She appears well-developed and well-nourished. She appears distressed.  HENT:  Head: Normocephalic and atraumatic.  Eyes: EOM are normal. Pupils are equal, round, and reactive to light.    Fundoscopic exam:      The right eye shows no papilledema.       The left eye shows no papilledema.  Neck: Normal range of motion. Neck supple.  Cardiovascular: Normal rate, regular rhythm, normal heart sounds and intact distal pulses.  Exam reveals no friction rub.   No murmur heard. Pulmonary/Chest: Effort normal and breath sounds normal. She has no wheezes. She has no rales.  Abdominal: Soft. Bowel sounds are normal. She exhibits no distension. There is no tenderness. There is no rebound and no guarding.  Musculoskeletal: Normal range of motion. She exhibits no tenderness.       No edema  Lymphadenopathy:    She has no cervical adenopathy.  Neurological: She is alert and oriented to person, place, and time. She has normal strength. No cranial nerve deficit or sensory deficit. Gait normal.       photophobia  Skin: Skin is warm and dry. No rash noted.  Psychiatric: She has a normal mood and affect. Her behavior is normal.    ED Course  Procedures (including critical care time)  Labs Reviewed - No data to display No results found.   1. Migraine       MDM   Pt with typical migraine HA without sx suggestive of SAH(sudden onset, worst of life, or deficits), infection, or cavernous vein thrombosis.  Normal neuro exam and vital signs. Patient has been [place] emergency room 3 times this week do to persistent headache. She has seen a neurologist in the past for these headaches but they have not been this bad for a while. No symptoms suggestive of change in her headache. She states she's had headaches like this in the past. No infectious symptoms and normal neuro exam. She denies any vision changes or symptoms concerning for idiopathic intracranial hypertension. Will try subcutaneous Imitrex to see if that improves her headache. She states over the last 2 days she's had multiple rounds of Dilaudid, Toradol, Benadryl, magnesium and the headache initially goes away and then it comes  back.  2:37 PM Pt still having pain.  4:12 PM Pain is now controlled.      Gwyneth Sprout, MD 07/07/11 [phone]

## 2011-07-07 NOTE — ED Notes (Addendum)
Instructions given

## 2011-07-07 NOTE — Discharge Instructions (Signed)
.  Migraine Headache A migraine is very bad pain on one or both sides of your head. The cause of a migraine is not always known. A migraine can be triggered or caused by different things, such as:  Alcohol.   Smoking.   Stress.   Periods (menstruation) in women.   Aged cheeses.   Foods or drinks that contain nitrates, glutamate, aspartame, or tyramine.   Lack of sleep.   Chocolate.   Caffeine.   Hunger.   Medicines, such as nitroglycerine (used to treat chest pain), birth control pills, estrogen, and some blood pressure medicines.  HOME CARE  Many medicines can help migraine pain or keep migraines from coming back. Your doctor can help you decide on a medicine or treatment program.   If you or your child gets a migraine, it may help to lie down in a dark, quiet room.   Keep a headache journal. This may help find out what is causing the headaches. For example, write down:   What you eat and drink.   How much sleep you get.   Any change to your diet or medicines.  GET HELP RIGHT AWAY IF:   The medicine does not work.   The pain begins again.   The neck is stiff.   You have trouble seeing.   The muscles are weak or you lose muscle control.   You have new symptoms.   You lose your balance.   You have trouble walking.   You feel faint or pass out.  MAKE SURE YOU:   Understand these instructions.   Will watch this condition.   Will get help right away if you are not doing well or get worse.  Document Released: 02/01/2008 Document Revised: 01/04/2011 Document Reviewed: 12/28/2008 [employer] Patient Information 2012 [employer]. 

## 2011-08-11 ENCOUNTER — Emergency Department (HOSPITAL_COMMUNITY): Payer: BC Managed Care – PPO

## 2011-08-11 ENCOUNTER — Emergency Department (HOSPITAL_COMMUNITY)
Admission: EM | Admit: 2011-08-11 | Discharge: 2011-08-11 | Disposition: A | Discharge status: Home / Self Care | Payer: BC Managed Care – PPO | Attending: Emergency Medicine | Admitting: Emergency Medicine

## 2011-08-11 ENCOUNTER — Encounter (HOSPITAL_COMMUNITY): Payer: Self-pay | Admitting: Family Medicine

## 2011-08-11 DIAGNOSIS — R10811 Right upper quadrant abdominal tenderness: Secondary | ICD-10-CM | POA: Insufficient documentation

## 2011-08-11 DIAGNOSIS — R609 Edema, unspecified: Secondary | ICD-10-CM | POA: Insufficient documentation

## 2011-08-11 DIAGNOSIS — F329 Major depressive disorder, single episode, unspecified: Secondary | ICD-10-CM | POA: Insufficient documentation

## 2011-08-11 DIAGNOSIS — R112 Nausea with vomiting, unspecified: Secondary | ICD-10-CM | POA: Insufficient documentation

## 2011-08-11 DIAGNOSIS — R1011 Right upper quadrant pain: Secondary | ICD-10-CM | POA: Insufficient documentation

## 2011-08-11 DIAGNOSIS — F3289 Other specified depressive episodes: Secondary | ICD-10-CM | POA: Insufficient documentation

## 2011-08-11 DIAGNOSIS — R1915 Other abnormal bowel sounds: Secondary | ICD-10-CM | POA: Insufficient documentation

## 2011-08-11 DIAGNOSIS — N83209 Unspecified ovarian cyst, unspecified side: Secondary | ICD-10-CM | POA: Insufficient documentation

## 2011-08-11 DIAGNOSIS — Z9889 Other specified postprocedural states: Secondary | ICD-10-CM | POA: Insufficient documentation

## 2011-08-11 DIAGNOSIS — E669 Obesity, unspecified: Secondary | ICD-10-CM | POA: Insufficient documentation

## 2011-08-11 DIAGNOSIS — Z79899 Other long term (current) drug therapy: Secondary | ICD-10-CM | POA: Insufficient documentation

## 2011-08-11 LAB — URINALYSIS, ROUTINE W REFLEX MICROSCOPIC
Bilirubin Urine: NEGATIVE
Glucose, UA: NEGATIVE mg/dL
Hgb urine dipstick: NEGATIVE
Ketones, ur: NEGATIVE mg/dL
Protein, ur: NEGATIVE mg/dL

## 2011-08-11 LAB — COMPREHENSIVE METABOLIC PANEL
AST: 26 U/L (ref 0–37)
Albumin: 3.7 g/dL (ref 3.5–5.2)
Alkaline Phosphatase: 81 U/L (ref 39–117)
BUN: 9 mg/dL (ref 6–23)
Chloride: 103 mEq/L (ref 96–112)
Potassium: 4 mEq/L (ref 3.5–5.1)
Total Bilirubin: 0.5 mg/dL (ref 0.3–1.2)

## 2011-08-11 LAB — DIFFERENTIAL
Basophils Relative: 2 % — ABNORMAL HIGH (ref 0–1)
Eosinophils Relative: 2 % (ref 0–5)
Lymphs Abs: 3.4 10*3/uL (ref 0.7–4.0)
Monocytes Relative: 8 % (ref 3–12)

## 2011-08-11 LAB — CBC
Hemoglobin: 12.9 g/dL (ref 12.0–15.0)
MCV: 88.9 fL (ref 78.0–100.0)
Platelets: 379 10*3/uL (ref 150–400)
RBC: 4.15 MIL/uL (ref 3.87–5.11)
WBC: 13.2 10*3/uL — ABNORMAL HIGH (ref 4.0–10.5)

## 2011-08-11 MED ORDER — ONDANSETRON HCL 4 MG/2ML IJ SOLN
4.0000 mg | Freq: Once | INTRAMUSCULAR | Status: AC
  Administered 2011-08-11: 4 mg via INTRAVENOUS
  Filled 2011-08-11: qty 2

## 2011-08-11 MED ORDER — DIPHENHYDRAMINE HCL 50 MG/ML IJ SOLN
25.0000 mg | Freq: Once | INTRAMUSCULAR | Status: AC
  Administered 2011-08-11: 25 mg via INTRAVENOUS
  Filled 2011-08-11: qty 1

## 2011-08-11 MED ORDER — HYDROMORPHONE HCL PF 1 MG/ML IJ SOLN
1.0000 mg | Freq: Once | INTRAMUSCULAR | Status: AC
  Administered 2011-08-11: 1 mg via INTRAVENOUS
  Filled 2011-08-11: qty 1

## 2011-08-11 MED ORDER — SODIUM CHLORIDE 0.9 % IV SOLN: 1000.0000 mL | INTRAVENOUS | Status: DC

## 2011-08-11 MED ORDER — ONDANSETRON HCL 4 MG PO TABS: 4.0000 mg | ORAL_TABLET | Freq: Four times a day (QID) | ORAL | Status: AC

## 2011-08-11 MED ORDER — OXYCODONE-ACETAMINOPHEN 5-325 MG PO TABS: 1.0000 | ORAL_TABLET | ORAL | Status: AC | PRN

## 2011-08-11 MED ORDER — SODIUM CHLORIDE 0.9 % IV SOLN
1000.0000 mL | Freq: Once | INTRAVENOUS | Status: AC
  Administered 2011-08-11: 1000 mL via INTRAVENOUS

## 2011-08-11 NOTE — ED Provider Notes (Signed)
History     CSN: [id number]  Arrival date & time 08/11/11  1351   First MD Initiated Contact with Patient 08/11/11 1413      Chief Complaint  Patient presents with  . Abdominal Pain  . Emesis    HPI The patient presents to the emergency room with complaints of right upper abdominal pain. The symptoms started yesterday. The pain is located in the right upper quadrant and sometimes radiates towards the side. It is very severe in nature. Patient has had nausea vomiting and can't keep anything down. She also feels like she has to pass gas but is unable to do so. Patient has history of multiple abdominal surgeries as listed. She has had bouts structures in the past and this feels somewhat similar. She denies any fever or or hematemesis. Past Medical History  Diagnosis Date  . Migraines   . Pneumonia     04/2010  . Eye infection     within last 2 weeks  . Depression   . Complication of anesthesia     Difficult IV stick with Central line placement    Past Surgical History  Procedure Date  . Tonsillectomy 1991  . Appendectomy 1992  . Cholecystectomy 2003  . Right ovary removed 2006  . Abdominal adhesion surgery 2006  . Knee arthroscopy 2001, 2010    left knee x 2  . Hernia repair     umbilical  . Nasal septoplasty w/ turbinoplasty 03/16/2011    Procedure: NASAL SEPTOPLASTY WITH TURBINATE REDUCTION;  Surgeon: Susy Frizzle, MD;  Location: [facility] OR;  Service: ENT;  Laterality: Bilateral;  . Laparoscopic gastric banding   . Removal of lap band   . Pilonidal cyst excision   . Esophagogastroduodenoscopy 04/05/2011    Procedure: ESOPHAGOGASTRODUODENOSCOPY (EGD);  Surgeon: Charna Elizabeth, MD;  Location: [facility] ENDOSCOPY;  Service: Endoscopy;  Laterality: N/A;    History reviewed. No pertinent family history.  History  Substance Use Topics  . Smoking status: Never Smoker   . Smokeless tobacco: Never Used  . Alcohol Use: No    OB History    Grav Para Term Preterm Abortions TAB SAB Ect  Mult Living                  Review of Systems  All other systems reviewed and are negative.    Allergies  Toradol; Compazine; Haloperidol and related; and Reglan  Home Medications   Current Outpatient Rx  Name Route Sig Dispense Refill  . DULOXETINE HCL 60 MG PO CPEP Oral Take 60 mg by mouth daily.      . QUETIAPINE FUMARATE 25 MG PO TABS Oral Take 25 mg by mouth at bedtime as needed. For sleep.      BP 111/65  Pulse 77  Temp(Src) 97.9 F (36.6 C) (Oral)  Resp 20  SpO2 99%  Physical Exam  Nursing note and vitals reviewed. Constitutional: She appears well-developed and well-nourished. No distress.       obese  HENT:  Head: Normocephalic and atraumatic.  Right Ear: External ear normal.  Left Ear: External ear normal.  Eyes: Conjunctivae are normal. Right eye exhibits no discharge. Left eye exhibits no discharge. No scleral icterus.  Neck: Neck supple. No tracheal deviation present.  Cardiovascular: Normal rate, regular rhythm and intact distal pulses.   Pulmonary/Chest: Effort normal and breath sounds normal. No stridor. No respiratory distress. She has no wheezes. She has no rales.  Abdominal: Soft. She exhibits no distension and no mass.  Bowel sounds are decreased. There is tenderness in the right upper quadrant. There is no rebound and no guarding.  Musculoskeletal: She exhibits edema. She exhibits no tenderness.  Neurological: She is alert. She has normal strength. No sensory deficit. Cranial nerve deficit:  no gross defecits noted. She exhibits normal muscle tone. She displays no seizure activity. Coordination normal.  Skin: Skin is warm and dry. No rash noted.  Psychiatric: She has a normal mood and affect.    ED Course  Procedures (including critical care time)  Medications  0.9 %  sodium chloride infusion (1000 mL Intravenous New Bag/Given 08/11/11 1620)    Followed by  0.9 %  sodium chloride infusion (not administered)  promethazine (PHENERGAN) 25 MG tablet  (not administered)  ondansetron (ZOFRAN) 4 MG tablet (not administered)  pantoprazole (PROTONIX) 40 MG tablet (not administered)  Ondansetron HCl (ZOFRAN PO) (not administered)  HYDROmorphone (DILAUDID) injection 1 mg (1 mg Intravenous Given 08/11/11 1617)  ondansetron (ZOFRAN) injection 4 mg (4 mg Intravenous Given 08/11/11 1614)     Medications  0.9 %  sodium chloride infusion (1000 mL Intravenous New Bag/Given 08/11/11 1620)    Followed by  0.9 %  sodium chloride infusion (not administered)  promethazine (PHENERGAN) 25 MG tablet (not administered)  ondansetron (ZOFRAN) 4 MG tablet (not administered)  pantoprazole (PROTONIX) 40 MG tablet (not administered)  Ondansetron HCl (ZOFRAN PO) (not administered)  HYDROmorphone (DILAUDID) injection 1 mg (1 mg Intravenous Given 08/11/11 1617)  ondansetron (ZOFRAN) injection 4 mg (4 mg Intravenous Given 08/11/11 1614)    Labs Reviewed  URINALYSIS, ROUTINE W REFLEX MICROSCOPIC - Abnormal; Notable for the following:    APPearance CLOUDY (*)    All other components within normal limits  CBC  DIFFERENTIAL  COMPREHENSIVE METABOLIC PANEL  LIPASE, BLOOD     MDM  Pt with recurrent abdominal pain.  Multiple prior surgeries.  Will ct to rule out obstruction as suggested by her PCP.  Case signed out to Dr Ranae Palms pending the CT scan.       Celene Kras, MD 08/11/11 [phone]

## 2011-08-11 NOTE — ED Notes (Signed)
Pt reports being sent over here by [facility] physicians for possible sbo. Reports severe abdominal pain,N/V, unable to pass gas.

## 2011-08-11 NOTE — Discharge Instructions (Signed)
Abdominal Pain  Abdominal pain can be caused by many things. Your caregiver decides the seriousness of your pain by an examination and possibly blood tests and X-rays. Many cases can be observed and treated at home. Most abdominal pain is not caused by a disease and will probably improve without treatment. However, in many cases, more time must pass before a clear cause of the pain can be found. Before that point, it may not be known if you need more testing, or if hospitalization or surgery is needed.  HOME CARE INSTRUCTIONS    Do not take laxatives unless directed by your caregiver.   Take pain medicine only as directed by your caregiver.   Only take over-the-counter or prescription medicines for pain, discomfort, or fever as directed by your caregiver.   Try a clear liquid diet (broth, tea, or water) for as long as directed by your caregiver. Slowly move to a bland diet as tolerated.  SEEK IMMEDIATE MEDICAL CARE IF:    The pain does not go away.   You have a fever.   You keep throwing up (vomiting).   The pain is felt only in portions of the abdomen. Pain in the right side could possibly be appendicitis. In an adult, pain in the left lower portion of the abdomen could be colitis or diverticulitis.   You pass bloody or black tarry stools.  MAKE SURE YOU:    Understand these instructions.   Will watch your condition.   Will get help right away if you are not doing well or get worse.  Document Released: 02/01/2005 Document Revised: 04/13/2011 Document Reviewed: 12/11/2007  Exit[employer] Patient Information 2012 [employer]  Ovarian Cyst  The ovaries are small organs that are on each side of the uterus. The ovaries are the organs that produce the female hormones, estrogen and progesterone. An ovarian cyst is a sac filled with fluid that can vary in its size. It is normal for a small cyst to form in women who are in the childbearing age and who have menstrual periods. This type of cyst is called a follicle  cyst that becomes an ovulation cyst (corpus luteum cyst) after it produces the women's egg. It later goes away on its own if the woman does not become pregnant. There are other kinds of ovarian cysts that may cause problems and may need to be treated. The most serious problem is a cyst with cancer. It should be noted that menopausal women who have an ovarian cyst are at a higher risk of it being a cancer cyst. They should be evaluated very quickly, thoroughly and followed closely. This is especially true in menopausal women because of the high rate of ovarian cancer in women in menopause.  CAUSES AND TYPES OF OVARIAN CYSTS:   FUNCTIONAL CYST: The follicle/corpus luteum cyst is a functional cyst that occurs every month during ovulation with the menstrual cycle. They go away with the next menstrual cycle if the woman does not get pregnant. Usually, there are no symptoms with a functional cyst.   ENDOMETRIOMA CYST: This cyst develops from the lining of the uterus tissue. This cyst gets in or on the ovary. It grows every month from the bleeding during the menstrual period. It is also called a "chocolate cyst" because it becomes filled with blood that turns brown. This cyst can cause pain in the lower abdomen during intercourse and with your menstrual period.   CYSTADENOMA CYST: This cyst develops from the cells on the outside   of the ovary. They usually are not cancerous. They can get very big and cause lower abdomen pain and pain with intercourse. This type of cyst can twist on itself, cut off its blood supply and cause severe pain. It also can easily rupture and cause a lot of pain.   DERMOID CYST: This type of cyst is sometimes found in both ovaries. They are found to have different kinds of body tissue in the cyst. The tissue includes skin, teeth, hair, and/or cartilage. They usually do not have symptoms unless they get very big. Dermoid cysts are rarely cancerous.   POLYCYSTIC OVARY: This is a rare condition  with hormone problems that produces many small cysts on both ovaries. The cysts are follicle-like cysts that never produce an egg and become a corpus luteum. It can cause an increase in body weight, infertility, acne, increase in body and facial hair and lack of menstrual periods or rare menstrual periods. Many women with this problem develop type 2 diabetes. The exact cause of this problem is unknown. A polycystic ovary is rarely cancerous.   THECA LUTEIN CYST: Occurs when too much hormone (human chorionic gonadotropin) is produced and over-stimulates the ovaries to produce an egg. They are frequently seen when doctors stimulate the ovaries for invitro-fertilization (test tube babies).   LUTEOMA CYST: This cyst is seen during pregnancy. Rarely it can cause an obstruction to the birth canal during labor and delivery. They usually go away after delivery.  SYMPTOMS    Pelvic pain or pressure.   Pain during sexual intercourse.   Increasing girth (swelling) of the abdomen.   Abnormal menstrual periods.   Increasing pain with menstrual periods.   You stop having menstrual periods and you are not pregnant.  DIAGNOSIS   The diagnosis can be made during:   Routine or annual pelvic examination (common).   Ultrasound.   X-ray of the pelvis.   CT Scan.   MRI.   Blood tests.  TREATMENT    Treatment may only be to follow the cyst monthly for 2 to 3 months with your caregiver. Many go away on their own, especially functional cysts.   May be aspirated (drained) with a long needle with ultrasound, or by laparoscopy (inserting a tube into the pelvis through a small incision).   The whole cyst can be removed by laparoscopy.   Sometimes the cyst may need to be removed through an incision in the lower abdomen.   Hormone treatment is sometimes used to help dissolve certain cysts.   Birth control pills are sometimes used to help dissolve certain cysts.  HOME CARE INSTRUCTIONS   Follow your caregiver's advice  regarding:   Medicine.   Follow up visits to evaluate and treat the cyst.   You may need to come back or make an appointment with another caregiver, to find the exact cause of your cyst, if your caregiver is not a gynecologist.   Get your yearly and recommended pelvic examinations and Pap tests.   Let your caregiver know if you have had an ovarian cyst in the past.  SEEK MEDICAL CARE IF:    Your periods are late, irregular, they stop, or are painful.   Your stomach (abdomen) or pelvic pain does not go away.   Your stomach becomes larger or swollen.   You have pressure on your bladder or trouble emptying your bladder completely.   You have painful sexual intercourse.   You have feelings of fullness, pressure, or discomfort in your   stomach.   You lose weight for no apparent reason.   You feel generally ill.   You become constipated.   You lose your appetite.   You develop acne.   You have an increase in body and facial hair.   You are gaining weight, without changing your exercise and eating habits.   You think you are pregnant.  SEEK IMMEDIATE MEDICAL CARE IF:    You have increasing abdominal pain.   You feel sick to your stomach (nausea) and/or vomit.   You develop a fever that comes on suddenly.   You develop abdominal pain during a bowel movement.   Your menstrual periods become heavier than usual.  Document Released: 04/24/2005 Document Revised: 04/13/2011 Document Reviewed: 02/25/2009  [employer] Patient Information 2012 [employer].

## 2011-08-11 NOTE — ED Provider Notes (Signed)
Pt states she is feeling better and is following up with [facility] for chronic abdominal pain thought due to adhesions from prev surgery. VSS. Exam with no rebound or guarding. Mod TTP over RLQ no evidence of obstruction on CT, 2-3cm R ovarian cyst. D/C home to f/u with PMD. Return for any concerns  Loren Racer, MD 08/11/11 2132

## 2011-08-31 ENCOUNTER — Inpatient Hospital Stay (HOSPITAL_COMMUNITY)
Admission: EM | Admit: 2011-08-31 | Discharge: 2011-09-03 | DRG: 189 | Disposition: A | Discharge status: Home / Self Care | Payer: BC Managed Care – PPO | Attending: Internal Medicine | Admitting: Internal Medicine

## 2011-08-31 ENCOUNTER — Emergency Department (HOSPITAL_COMMUNITY): Payer: BC Managed Care – PPO

## 2011-08-31 ENCOUNTER — Encounter (HOSPITAL_COMMUNITY): Payer: Self-pay | Admitting: *Deleted

## 2011-08-31 DIAGNOSIS — G43909 Migraine, unspecified, not intractable, without status migrainosus: Secondary | ICD-10-CM | POA: Diagnosis present

## 2011-08-31 DIAGNOSIS — R1115 Cyclical vomiting syndrome unrelated to migraine: Secondary | ICD-10-CM | POA: Diagnosis present

## 2011-08-31 DIAGNOSIS — K660 Peritoneal adhesions (postprocedural) (postinfection): Principal | ICD-10-CM | POA: Diagnosis present

## 2011-08-31 DIAGNOSIS — G8929 Other chronic pain: Secondary | ICD-10-CM | POA: Diagnosis present

## 2011-08-31 DIAGNOSIS — F3289 Other specified depressive episodes: Secondary | ICD-10-CM | POA: Diagnosis present

## 2011-08-31 DIAGNOSIS — F329 Major depressive disorder, single episode, unspecified: Secondary | ICD-10-CM | POA: Diagnosis present

## 2011-08-31 DIAGNOSIS — R112 Nausea with vomiting, unspecified: Secondary | ICD-10-CM | POA: Diagnosis present

## 2011-08-31 DIAGNOSIS — N83209 Unspecified ovarian cyst, unspecified side: Secondary | ICD-10-CM | POA: Diagnosis present

## 2011-08-31 DIAGNOSIS — N390 Urinary tract infection, site not specified: Secondary | ICD-10-CM

## 2011-08-31 DIAGNOSIS — R109 Unspecified abdominal pain: Secondary | ICD-10-CM | POA: Diagnosis present

## 2011-08-31 LAB — CBC
HCT: 40.1 % (ref 36.0–46.0)
Hemoglobin: 13.8 g/dL (ref 12.0–15.0)
MCH: 30.4 pg (ref 26.0–34.0)
MCHC: 34.4 g/dL (ref 30.0–36.0)

## 2011-08-31 LAB — URINALYSIS, ROUTINE W REFLEX MICROSCOPIC
Ketones, ur: NEGATIVE mg/dL
Nitrite: NEGATIVE
Protein, ur: NEGATIVE mg/dL
Urobilinogen, UA: 0.2 mg/dL (ref 0.0–1.0)
pH: 7.5 (ref 5.0–8.0)

## 2011-08-31 LAB — COMPREHENSIVE METABOLIC PANEL
Albumin: 4.2 g/dL (ref 3.5–5.2)
BUN: 8 mg/dL (ref 6–23)
Chloride: 106 mEq/L (ref 96–112)
Creatinine, Ser: 0.53 mg/dL (ref 0.50–1.10)
GFR calc non Af Amer: >90 mL/min (ref >90)
Total Bilirubin: 0.4 mg/dL (ref 0.3–1.2)

## 2011-08-31 LAB — DIFFERENTIAL
Basophils Relative: 1 % (ref 0–1)
Monocytes Absolute: 0.6 10*3/uL (ref 0.1–1.0)
Monocytes Relative: 7 % (ref 3–12)
Neutro Abs: 5 10*3/uL (ref 1.7–7.7)

## 2011-08-31 LAB — LIPASE, BLOOD: Lipase: 42 U/L (ref 11–59)

## 2011-08-31 MED ORDER — ONDANSETRON HCL 4 MG/2ML IJ SOLN
4.0000 mg | Freq: Once | INTRAMUSCULAR | Status: AC
  Administered 2011-08-31: 4 mg via INTRAVENOUS
  Filled 2011-08-31: qty 2

## 2011-08-31 MED ORDER — DULOXETINE HCL 60 MG PO CPEP
60.0000 mg | ORAL_CAPSULE | Freq: Every day | ORAL | Status: DC
  Administered 2011-08-31 – 2011-09-03 (×4): 60 mg via ORAL
  Filled 2011-08-31 (×4): qty 1

## 2011-08-31 MED ORDER — PROMETHAZINE HCL 25 MG/ML IJ SOLN
12.5000 mg | Freq: Four times a day (QID) | INTRAMUSCULAR | Status: DC | PRN
  Administered 2011-08-31 – 2011-09-02 (×7): 12.5 mg via INTRAVENOUS
  Filled 2011-08-31 (×7): qty 1

## 2011-08-31 MED ORDER — QUETIAPINE FUMARATE 25 MG PO TABS
25.0000 mg | ORAL_TABLET | Freq: Every evening | ORAL | Status: DC | PRN
  Administered 2011-09-02: 25 mg via ORAL
  Filled 2011-08-31: qty 1

## 2011-08-31 MED ORDER — DIPHENHYDRAMINE HCL 50 MG/ML IJ SOLN
12.5000 mg | Freq: Once | INTRAMUSCULAR | Status: AC
  Administered 2011-08-31: 12.5 mg via INTRAVENOUS
  Filled 2011-08-31: qty 1

## 2011-08-31 MED ORDER — SODIUM CHLORIDE 0.9 % IV BOLUS (SEPSIS)
1000.0000 mL | Freq: Once | INTRAVENOUS | Status: AC
  Administered 2011-08-31: 1000 mL via INTRAVENOUS

## 2011-08-31 MED ORDER — DEXTROSE 5 % IV SOLN: 1.0000 g | INTRAVENOUS | Status: DC

## 2011-08-31 MED ORDER — HYDROMORPHONE HCL PF 1 MG/ML IJ SOLN
1.0000 mg | Freq: Once | INTRAMUSCULAR | Status: AC
  Administered 2011-08-31: 1 mg via INTRAVENOUS
  Filled 2011-08-31: qty 1

## 2011-08-31 MED ORDER — DIPHENHYDRAMINE HCL 50 MG/ML IJ SOLN
25.0000 mg | Freq: Once | INTRAMUSCULAR | Status: AC
  Administered 2011-08-31: 25 mg via INTRAVENOUS
  Filled 2011-08-31: qty 1

## 2011-08-31 MED ORDER — PANTOPRAZOLE SODIUM 40 MG IV SOLR
40.0000 mg | INTRAVENOUS | Status: DC
  Administered 2011-09-01: 40 mg via INTRAVENOUS
  Filled 2011-08-31 (×2): qty 40

## 2011-08-31 MED ORDER — HYDROMORPHONE HCL PF 1 MG/ML IJ SOLN
1.0000 mg | INTRAMUSCULAR | Status: DC | PRN
  Administered 2011-08-31 – 2011-09-02 (×9): 1 mg via INTRAVENOUS
  Filled 2011-08-31 (×9): qty 1

## 2011-08-31 MED ORDER — DIPHENHYDRAMINE HCL 50 MG/ML IJ SOLN
25.0000 mg | Freq: Four times a day (QID) | INTRAMUSCULAR | Status: DC | PRN
  Administered 2011-08-31 – 2011-09-02 (×6): 25 mg via INTRAVENOUS
  Filled 2011-08-31 (×6): qty 1

## 2011-08-31 MED ORDER — ONDANSETRON HCL 4 MG/2ML IJ SOLN: 4.0000 mg | Freq: Four times a day (QID) | INTRAMUSCULAR | Status: DC | PRN

## 2011-08-31 MED ORDER — IOHEXOL 300 MG/ML  SOLN
100.0000 mL | Freq: Once | INTRAMUSCULAR | Status: AC | PRN
  Administered 2011-08-31: 100 mL via INTRAVENOUS

## 2011-08-31 MED ORDER — PROPRANOLOL HCL 40 MG PO TABS
40.0000 mg | ORAL_TABLET | Freq: Every day | ORAL | Status: DC
  Administered 2011-08-31 – 2011-09-03 (×4): 40 mg via ORAL
  Filled 2011-08-31 (×4): qty 1

## 2011-08-31 MED ORDER — DEXTROSE 5 % IV SOLN
1.0000 g | INTRAVENOUS | Status: DC
  Administered 2011-09-01 – 2011-09-02 (×2): 1 g via INTRAVENOUS
  Filled 2011-08-31 (×3): qty 10

## 2011-08-31 MED ORDER — DEXTROSE 5 % IV SOLN
1.0000 g | Freq: Once | INTRAVENOUS | Status: AC
  Administered 2011-08-31: 1 g via INTRAVENOUS
  Filled 2011-08-31: qty 10

## 2011-08-31 MED ORDER — PANTOPRAZOLE SODIUM 40 MG IV SOLR
40.0000 mg | INTRAVENOUS | Status: DC
  Administered 2011-08-31: 40 mg via INTRAVENOUS
  Filled 2011-08-31: qty 40

## 2011-08-31 NOTE — ED Notes (Signed)
Patient transported to CT 

## 2011-08-31 NOTE — ED Notes (Signed)
Pt reports drinking oral contrast. No observed vomiting, however the floor in pts room was noted to be wet with clear colored fluid. The fluid was under the pts bed. Pt failed to report spilling liquid or vomiting liquid. Fluid cleaned with towel. CT tech notified that fluid on floor appeared to be contrast.

## 2011-08-31 NOTE — ED Provider Notes (Signed)
CT is negative for acute pathology. When reevaluating the patient she is still having significant right-sided abdominal pain and vomiting  Loretta Sprout, MD 08/31/11 1645

## 2011-08-31 NOTE — ED Notes (Signed)
Admitting MD at bedside.

## 2011-08-31 NOTE — H&P (Signed)
PCP:   Sissy Hoff, MD, MD   Chief Complaint:  Abdominal pain, Nausea, vomiting  HPI: Loretta Alexander is a 31year old female with h/o chronic abdominal pain and multiple abdominal surgeries presents with the above mentioned complaint. This time around she reports severe right sided and diffuse abdominal pain, associated with profuse nausea and vomiting for last 5weeks, exacerbated by eating. she has been multiple times to different ERs during this interval. She also reports intermittent diarrhea and constipation occasionally with blood in stool. Previously had lysis of adhesions in 2010 at [facility], [place]. Also being treated for ovarian cyst with Lupron. Last admission at [facility] by [facility] in 11/12, workup was unremarkable, EGD with mild gastritis and concern about discrepancy between objective findings and reported symptoms. Denies any fevers or chills Today she was seen at [facility] GI office and was advised to come to ER. In ER, had CT Abd/pelvis which is unremarkable, and normal CBC, LFTs, and electrolytes  Allergies:   Allergies  Allergen Reactions  . Toradol (Ketorolac Tromethamine) Nausea And Vomiting  . Compazine (Prochlorperazine Maleate) Anxiety and Other (See Comments)    dizziness  . Haloperidol And Related Anxiety and Other (See Comments)    dizziness  . Reglan (Metoclopramide Hcl) Anxiety and Other (See Comments)    dizziness      Past Medical History  Diagnosis Date  . Migraines   . Pneumonia     04/2010  . Eye infection     within last 2 weeks  . Depression   . Complication of anesthesia     Difficult IV stick with Central line placement    Past Surgical History  Procedure Date  . Tonsillectomy 1991  . Appendectomy 1992  . Cholecystectomy 2003  . Right ovary removed 2006  . Abdominal adhesion surgery 2006  . Knee arthroscopy 2001, 2010    left knee x 2  . Hernia repair     umbilical  . Nasal septoplasty w/ turbinoplasty 03/16/2011    Procedure: NASAL  SEPTOPLASTY WITH TURBINATE REDUCTION;  Surgeon: Susy Frizzle, MD;  Location: [facility] OR;  Service: ENT;  Laterality: Bilateral;  . Laparoscopic gastric banding   . Removal of lap band   . Pilonidal cyst excision   . Esophagogastroduodenoscopy 04/05/2011    Procedure: ESOPHAGOGASTRODUODENOSCOPY (EGD);  Surgeon: Charna Elizabeth, MD;  Location: [facility] ENDOSCOPY;  Service: Endoscopy;  Laterality: N/A;    Prior to Admission medications   Medication Sig Start Date End Date Taking? Authorizing Provider  DULoxetine (CYMBALTA) 60 MG capsule Take 60 mg by mouth daily.     Yes Historical Provider, MD  Leuprolide Acetate (LUPRON DEPOT IM) Inject 1 Syringe into the muscle every 3 (three) months.   Yes Historical Provider, MD  Ondansetron HCl (ZOFRAN PO) Take 1 tablet by mouth every 6 (six) hours as needed. For nausea.   Yes Historical Provider, MD  pantoprazole (PROTONIX) 40 MG tablet Take 40 mg by mouth daily.   Yes Historical Provider, MD  promethazine (PHENERGAN) 25 MG suppository Place 25 mg rectally every 6 (six) hours as needed. For nausea   Yes Historical Provider, MD  promethazine (PHENERGAN) 25 MG tablet Take 25 mg by mouth every 6 (six) hours as needed.   Yes Historical Provider, MD  propranolol (INDERAL) 40 MG tablet Take 40 mg by mouth daily.   Yes Historical Provider, MD  QUEtiapine (SEROQUEL) 25 MG tablet Take 25 mg by mouth at bedtime as needed. For sleep.   Yes Historical Provider, MD  Social History: Single, denies smoking, etoh use-rare, denies illicit drug use Currently lives with her mother  History reviewed. No pertinent family history.  Review of Systems:  Constitutional: Denies fever, chills, diaphoresis, appetite change and fatigue.  HEENT: Denies photophobia, eye pain, redness, hearing loss, ear pain, congestion, sore throat, rhinorrhea, sneezing, mouth sores, trouble swallowing, neck pain, neck stiffness and tinnitus.   Respiratory: Denies SOB, DOE, cough, chest tightness,  and  wheezing.   Cardiovascular: Denies chest pain, palpitations and leg swelling.  Gastrointestinal: Denies nausea, vomiting, abdominal pain, diarrhea, constipation, blood in stool and abdominal distention.  Genitourinary: Denies dysuria, urgency, frequency, hematuria, flank pain and difficulty urinating.  Musculoskeletal: Denies myalgias, back pain, joint swelling, arthralgias and gait problem.  Skin: Denies pallor, rash and wound.  Neurological: Denies dizziness, seizures, syncope, weakness, light-headedness, numbness and headaches.  Hematological: Denies adenopathy. Easy bruising, personal or family bleeding history  Psychiatric/Behavioral: Denies suicidal ideation, mood changes, confusion, nervousness, sleep disturbance and agitation   Physical Exam: Blood pressure 110/74, pulse 69, temperature 98.6 F (37 C), temperature source Oral, resp. rate 20, height 5\' 2"  (1.575 m), weight 110 kg (242 lb 8.1 oz), SpO2 98.00%. Gen: Obese, AAOx3, no distress HEENT: oral mucosa dry, neck no JVD, lymphadenopathy CVS: S1S2/RRR Lungs: CTAB Abd: soft, nontender, slight wincing upon palpation of R peri-umbilical area Non distended, BS present, no flank tenderness Ext: no edema, C/C NEuro: nonfocal Musculoskeletal: no evidence of synovitis, joint swelling Skin: NAD  Labs on Admission:  Results for orders placed during the hospital encounter of 08/31/11 (from the past 48 hour(s))  CBC     Status: Normal   Collection Time   08/31/11  1:42 PM      Component Value Range Comment   WBC 8.0  4.0 - 10.5 (K/uL)    RBC 4.54  3.87 - 5.11 (MIL/uL)    Hemoglobin 13.8  12.0 - 15.0 (g/dL)    HCT 40.9  81.1 - 91.4 (%)    MCV 88.3  78.0 - 100.0 (fL)    MCH 30.4  26.0 - 34.0 (pg)    MCHC 34.4  30.0 - 36.0 (g/dL)    RDW 78.2  95.6 - 21.3 (%)    Platelets 343  150 - 400 (K/uL)   DIFFERENTIAL     Status: Normal   Collection Time   08/31/11  1:42 PM      Component Value Range Comment   Neutrophils Relative 63  43 -  77 (%)    Neutro Abs 5.0  1.7 - 7.7 (K/uL)    Lymphocytes Relative 27  12 - 46 (%)    Lymphs Abs 2.2  0.7 - 4.0 (K/uL)    Monocytes Relative 7  3 - 12 (%)    Monocytes Absolute 0.6  0.1 - 1.0 (K/uL)    Eosinophils Relative 3  0 - 5 (%)    Eosinophils Absolute 0.2  0.0 - 0.7 (K/uL)    Basophils Relative 1  0 - 1 (%)    Basophils Absolute 0.0  0.0 - 0.1 (K/uL)   COMPREHENSIVE METABOLIC PANEL     Status: Abnormal   Collection Time   08/31/11  1:42 PM      Component Value Range Comment   Sodium 138  135 - 145 (mEq/L)    Potassium 3.6  3.5 - 5.1 (mEq/L)    Chloride 106  96 - 112 (mEq/L)    CO2 20  19 - 32 (mEq/L)    Glucose, Bld 114 (*)  70 - 99 (mg/dL)    BUN 8  6 - 23 (mg/dL)    Creatinine, Ser 1.61  0.50 - 1.10 (mg/dL)    Calcium 9.5  8.4 - 10.5 (mg/dL)    Total Protein 7.5  6.0 - 8.3 (g/dL)    Albumin 4.2  3.5 - 5.2 (g/dL)    AST 21  0 - 37 (U/L)    ALT 33  0 - 35 (U/L)    Alkaline Phosphatase 80  39 - 117 (U/L)    Total Bilirubin 0.4  0.3 - 1.2 (mg/dL)    GFR calc non Af Amer >90  >90 (mL/min)    GFR calc Af Amer >90  >90 (mL/min)   LIPASE, BLOOD     Status: Normal   Collection Time   08/31/11  1:42 PM      Component Value Range Comment   Lipase 42  11 - 59 (U/L)   LACTIC ACID, PLASMA     Status: Normal   Collection Time   08/31/11  1:42 PM      Component Value Range Comment   Lactic Acid, Venous 1.1  0.5 - 2.2 (mmol/L)   URINALYSIS, ROUTINE W REFLEX MICROSCOPIC     Status: Abnormal   Collection Time   08/31/11  1:44 PM      Component Value Range Comment   Color, Urine YELLOW  YELLOW     APPearance CLOUDY (*) CLEAR     Specific Gravity, Urine 1.021  1.005 - 1.030     pH 7.5  5.0 - 8.0     Glucose, UA NEGATIVE  NEGATIVE (mg/dL)    Hgb urine dipstick NEGATIVE  NEGATIVE     Bilirubin Urine NEGATIVE  NEGATIVE     Ketones, ur NEGATIVE  NEGATIVE (mg/dL)    Protein, ur NEGATIVE  NEGATIVE (mg/dL)    Urobilinogen, UA 0.2  0.0 - 1.0 (mg/dL)    Nitrite NEGATIVE  NEGATIVE      Leukocytes, UA MODERATE (*) NEGATIVE    URINE MICROSCOPIC-ADD ON     Status: Abnormal   Collection Time   08/31/11  1:44 PM      Component Value Range Comment   Squamous Epithelial / LPF FEW (*) RARE     WBC, UA 11-20  <3 (WBC/hpf)    Bacteria, UA FEW (*) RARE     Urine-Other MUCOUS PRESENT       Radiological Exams on Admission: Ct Abdomen Pelvis W Contrast  08/31/2011  *RADIOLOGY REPORT*  Clinical Data: Diffuse abdominal pain.  Vomiting and diarrhea.  CT ABDOMEN AND PELVIS WITH CONTRAST  Technique:  Multidetector CT imaging of the abdomen and pelvis was performed following the standard protocol during bolus administration of intravenous contrast.  Contrast: OMNIPAQUE IOHEXOL 300 MG/ML  SOLN  Comparison: 08/11/2011.  Findings: Lung bases show no acute findings.  Heart is at the upper limits of normal in size.  No pericardial effusion. Trace bilateral pleural effusions.  Liver is unremarkable.  Cholecystectomy.  Adrenal glands, kidneys, spleen, pancreas, stomach and bowel are unremarkable. Postoperative changes and possible catheter fragment are again seen in the left ventral abdominal wall.  Small ventral hernia contains fat (image 69).  Intrauterine contraceptive device is in place.  Low attenuation lesion in the right adnexa measures 3.2 cm and is likely affiliated with the right ovary, indicative of a physiologic cyst.  Left ovary is visualized.  No pathologically enlarged lymph nodes.  No free fluid. No worrisome lytic or sclerotic lesions.  IMPRESSION:  1.  No findings to explain the patient's given symptoms. 2.  Trace bilateral pleural fluid.  Original Report Authenticated By: Reyes Ivan, M.D.    Assessment/Plan 1. Acute on Chronic Abdominal pain Unclear etiology, related to adhesions from prior surgeries vs bowel pathology vs functional CT without evidence of acute pathology/SBO/Ileus Admit to Med-Surg bed Remarkably electrolytes, Hb normal despite duration of symptoms IVF,  supportive care with anti-emetics, narcotics, IV PPI Will consult Dr.Outlaw in am, was considering colonoscopy per Pt Request records from [facility] 2. Possible UTI: continue ceftriaxone, check urine cultures 3. Depression continue Cymbalta/Seroquel  Further management as condition eveolves   Time Spent on Admission:  Shamar Kracke [facility] Pager: [phone] 08/31/2011, 10:15 PM

## 2011-08-31 NOTE — ED Notes (Signed)
REport given to floor. Bed is not ready, floor to notify ER when room is ready.

## 2011-08-31 NOTE — ED Notes (Signed)
Pt continues to c/o nausea and pain. No vomiting noted during ER duration.

## 2011-08-31 NOTE — ED Notes (Signed)
Returned from CT scan.

## 2011-08-31 NOTE — ED Notes (Signed)
Pt informed that pain medication is ordered q4h and not due again until around 9pm. Pt medicated for nausea. No vomiting noted by staff.

## 2011-08-31 NOTE — ED Notes (Signed)
Abd pain x approx 5 weeks w/ N/V/D.   Sent by GI MD today for CT.  Pt has had multiple abd surgeries and "has a lot of scar tissue"

## 2011-08-31 NOTE — ED Provider Notes (Signed)
History     CSN: [id number]  Arrival date & time 08/31/11  1224   First MD Initiated Contact with Patient 08/31/11 1309      Chief Complaint  Patient presents with  . Abdominal Pain    (Consider location/radiation/quality/duration/timing/severity/associated sxs/prior treatment) HPI Comments: History multiple abd surgeries. States she's had right-sided abdominal pain over the past 5 weeks. States it feels similar to past small bowel obstruction. His had surgery for adhesion lysis. Is followed at [facility].  Has been unable to tolerate po intake.  Patient is a 31 y.o. female presenting with abdominal pain. The history is provided by the patient. No language interpreter was used.  Abdominal Pain The primary symptoms of the illness include abdominal pain, nausea, vomiting and diarrhea. The primary symptoms of the illness do not include fever, fatigue, shortness of breath or hematemesis. The current episode started more than 2 days ago (5 weeks ago). The onset of the illness was gradual. The problem has been gradually worsening.  The pain came on gradually. The abdominal pain has been gradually worsening since its onset. The abdominal pain is located in the RUQ and RLQ. The abdominal pain does not radiate. The abdominal pain is relieved by nothing. The abdominal pain is exacerbated by vomiting.  Nausea began more than 1 week ago. The nausea is associated with eating. The nausea is exacerbated by food.  Vomiting occurs 2 to 5 times per day. The emesis contains stomach contents.  The diarrhea began more than 1 week ago. The diarrhea is watery. The diarrhea occurs 2 to 4 times per day.  Symptoms associated with the illness do not include chills, anorexia, constipation, frequency or back pain.    Past Medical History  Diagnosis Date  . Migraines   . Pneumonia     04/2010  . Eye infection     within last 2 weeks  . Depression   . Complication of anesthesia     Difficult IV stick with Central line  placement    Past Surgical History  Procedure Date  . Tonsillectomy 1991  . Appendectomy 1992  . Cholecystectomy 2003  . Right ovary removed 2006  . Abdominal adhesion surgery 2006  . Knee arthroscopy 2001, 2010    left knee x 2  . Hernia repair     umbilical  . Nasal septoplasty w/ turbinoplasty 03/16/2011    Procedure: NASAL SEPTOPLASTY WITH TURBINATE REDUCTION;  Surgeon: Susy Frizzle, MD;  Location: [facility] OR;  Service: ENT;  Laterality: Bilateral;  . Laparoscopic gastric banding   . Removal of lap band   . Pilonidal cyst excision   . Esophagogastroduodenoscopy 04/05/2011    Procedure: ESOPHAGOGASTRODUODENOSCOPY (EGD);  Surgeon: Charna Elizabeth, MD;  Location: [facility] ENDOSCOPY;  Service: Endoscopy;  Laterality: N/A;    History reviewed. No pertinent family history.  History  Substance Use Topics  . Smoking status: Never Smoker   . Smokeless tobacco: Never Used  . Alcohol Use: No    OB History    Grav Para Term Preterm Abortions TAB SAB Ect Mult Living                  Review of Systems  Constitutional: Negative for fever, chills, activity change, appetite change and fatigue.  HENT: Negative for congestion, sore throat, rhinorrhea, neck pain and neck stiffness.   Respiratory: Negative for cough and shortness of breath.   Cardiovascular: Negative for chest pain and palpitations.  Gastrointestinal: Positive for nausea, vomiting, abdominal pain and diarrhea. Negative  for constipation, anorexia and hematemesis.  Genitourinary: Negative for frequency.  Musculoskeletal: Negative for back pain.  Neurological: Negative for dizziness, weakness, light-headedness, numbness and headaches.  All other systems reviewed and are negative.    Allergies  Toradol; Compazine; Haloperidol and related; and Reglan  Home Medications   Current Outpatient Rx  Name Route Sig Dispense Refill  . DULOXETINE HCL 60 MG PO CPEP Oral Take 60 mg by mouth daily.      Marland Kitchen LUPRON DEPOT IM Intramuscular  Inject 1 Syringe into the muscle every 3 (three) months.    Marland Kitchen ZOFRAN PO Oral Take 1 tablet by mouth every 6 (six) hours as needed. For nausea.    Marland Kitchen PANTOPRAZOLE SODIUM 40 MG PO TBEC Oral Take 40 mg by mouth daily.    Marland Kitchen PROMETHAZINE HCL 25 MG RE SUPP Rectal Place 25 mg rectally every 6 (six) hours as needed. For nausea    . PROMETHAZINE HCL 25 MG PO TABS Oral Take 25 mg by mouth every 6 (six) hours as needed.    Marland Kitchen PROPRANOLOL HCL 40 MG PO TABS Oral Take 40 mg by mouth daily.    . QUETIAPINE FUMARATE 25 MG PO TABS Oral Take 25 mg by mouth at bedtime as needed. For sleep.      BP 117/67  Pulse 79  Temp(Src) 98.6 F (37 C) (Oral)  Resp 18  Ht 5\' 2"  (1.575 m)  Wt 240 lb (108.863 kg)  BMI 43.90 kg/m2  SpO2 100%  Physical Exam  Nursing note and vitals reviewed. Constitutional: She is oriented to person, place, and time. She appears well-developed and well-nourished.  HENT:  Head: Normocephalic and atraumatic.  Mouth/Throat: Oropharynx is clear and moist.  Eyes: Conjunctivae and EOM are normal. Pupils are equal, round, and reactive to light.  Neck: Normal range of motion. Neck supple.  Cardiovascular: Normal rate, regular rhythm, normal heart sounds and intact distal pulses.  Exam reveals no gallop and no friction rub.   No murmur heard. Pulmonary/Chest: Effort normal and breath sounds normal. No respiratory distress. She exhibits no tenderness.  Abdominal: Soft. Bowel sounds are normal. There is tenderness. There is guarding. There is no rebound.  Musculoskeletal: Normal range of motion. She exhibits no tenderness.  Neurological: She is alert and oriented to person, place, and time.  Skin: Skin is warm and dry.    ED Course  Procedures (including critical care time)  Labs Reviewed  COMPREHENSIVE METABOLIC PANEL - Abnormal; Notable for the following:    Glucose, Bld 114 (*)    All other components within normal limits  URINALYSIS, ROUTINE W REFLEX MICROSCOPIC - Abnormal; Notable  for the following:    APPearance CLOUDY (*)    Leukocytes, UA MODERATE (*)    All other components within normal limits  URINE MICROSCOPIC-ADD ON - Abnormal; Notable for the following:    Squamous Epithelial / LPF FEW (*)    Bacteria, UA FEW (*)    All other components within normal limits  CBC  DIFFERENTIAL  LIPASE, BLOOD  LACTIC ACID, PLASMA   Ct Abdomen Pelvis W Contrast  08/31/2011  *RADIOLOGY REPORT*  Clinical Data: Diffuse abdominal pain.  Vomiting and diarrhea.  CT ABDOMEN AND PELVIS WITH CONTRAST  Technique:  Multidetector CT imaging of the abdomen and pelvis was performed following the standard protocol during bolus administration of intravenous contrast.  Contrast: OMNIPAQUE IOHEXOL 300 MG/ML  SOLN  Comparison: 08/11/2011.  Findings: Lung bases show no acute findings.  Heart is at  the upper limits of normal in size.  No pericardial effusion. Trace bilateral pleural effusions.  Liver is unremarkable.  Cholecystectomy.  Adrenal glands, kidneys, spleen, pancreas, stomach and bowel are unremarkable. Postoperative changes and possible catheter fragment are again seen in the left ventral abdominal wall.  Small ventral hernia contains fat (image 69).  Intrauterine contraceptive device is in place.  Low attenuation lesion in the right adnexa measures 3.2 cm and is likely affiliated with the right ovary, indicative of a physiologic cyst.  Left ovary is visualized.  No pathologically enlarged lymph nodes.  No free fluid. No worrisome lytic or sclerotic lesions.  IMPRESSION:  1.  No findings to explain the patient's given symptoms. 2.  Trace bilateral pleural fluid.  Original Report Authenticated By: Reyes Ivan, M.D.     1. Abdominal pain   2. Nausea and vomiting   3. UTI (lower urinary tract infection)       MDM  Urinary Tract infection as well as intractable nausea and vomiting secondary to chronic abdominal pain. She was sent here by her GI physician for further evaluation of  this condition. Her CT abdomen pelvis is pending at this time. She received IV fluids and antiemetics.  She received abx for UTI.  Will be signed out to dr plunkett to await ct results and will admit for further evaluation and treatment        Dayton Bailiff, MD 08/31/11 1651

## 2011-09-01 LAB — COMPREHENSIVE METABOLIC PANEL
ALT: 33 U/L (ref 0–35)
Albumin: 3.7 g/dL (ref 3.5–5.2)
Alkaline Phosphatase: 76 U/L (ref 39–117)
GFR calc Af Amer: >90 mL/min (ref >90)
Glucose, Bld: 99 mg/dL (ref 70–99)
Potassium: 3.9 mEq/L (ref 3.5–5.1)
Sodium: 136 mEq/L (ref 135–145)
Total Protein: 6.9 g/dL (ref 6.0–8.3)

## 2011-09-01 MED ORDER — ONDANSETRON HCL 4 MG/2ML IJ SOLN
4.0000 mg | Freq: Three times a day (TID) | INTRAMUSCULAR | Status: DC
  Administered 2011-09-01 – 2011-09-03 (×7): 4 mg via INTRAVENOUS
  Filled 2011-09-01 (×7): qty 2

## 2011-09-01 MED ORDER — DICYCLOMINE HCL 10 MG PO CAPS
10.0000 mg | ORAL_CAPSULE | Freq: Three times a day (TID) | ORAL | Status: DC
  Administered 2011-09-01 – 2011-09-03 (×8): 10 mg via ORAL
  Filled 2011-09-01 (×12): qty 1

## 2011-09-01 NOTE — Progress Notes (Signed)
Patient stated that she threw up a coca cola, and that the nurse tech had emptied the basin.  Upon inspection of the emesis basin, dried and sticky coca cola from this morning was still noticeable in the emesis (e.g., no droplets from recent rinsing).  This nurse checked with the nurse tech, and the nurse tech denied knowing of any emesis or emptying any emesis.    This nurse also noted  reddened, irritated skin to the knuckles of both pointer-finger joints.   Will continue to monitor.

## 2011-09-01 NOTE — Progress Notes (Signed)
Subjective: Endorses persistent pain despite frequent pain medications. Endorses nausea, vomiting, diarrhea, but not witnessed by nursing staff   Objective: Vital signs in last 24 hours: Temp:  [98 F (36.7 C)-98.6 F (37 C)] 98.6 F (37 C) (04/26 0539) Pulse Rate:  [69-79] 76  (04/26 0539) Resp:  [18-20] 20  (04/26 0539) BP: (94-124)/(65-88) 94/65 mmHg (04/26 0539) SpO2:  [93 %-100 %] 93 % (04/26 0539) Weight:  [108.863 kg (240 lb)-110 kg (242 lb 8.1 oz)] 110 kg (242 lb 8.1 oz) (04/25 2028) Weight change:  Last BM Date: 08/31/11  PE: GEN:  Overweight, non-toxic HEENT:  Slightly dry mucous membranes ABD:  Protuberant, right-sided generalized tenderness; hypoactive but present bowel sounds; no peritonitis  Lab Results: CBC    Component Value Date/Time   WBC 8.0 08/31/2011 1342   RBC 4.54 08/31/2011 1342   HGB 13.8 08/31/2011 1342   HCT 40.1 08/31/2011 1342   PLT 343 08/31/2011 1342   MCV 88.3 08/31/2011 1342   MCH 30.4 08/31/2011 1342   MCHC 34.4 08/31/2011 1342   RDW 13.4 08/31/2011 1342   LYMPHSABS 2.2 08/31/2011 1342   MONOABS 0.6 08/31/2011 1342   EOSABS 0.2 08/31/2011 1342   BASOSABS 0.0 08/31/2011 1342   CMP     Component Value Date/Time   NA 136 09/01/2011 0400   K 3.9 09/01/2011 0400   CL 103 09/01/2011 0400   CO2 24 09/01/2011 0400   GLUCOSE 99 09/01/2011 0400   BUN 8 09/01/2011 0400   CREATININE 0.69 09/01/2011 0400   CALCIUM 8.9 09/01/2011 0400   PROT 6.9 09/01/2011 0400   ALBUMIN 3.7 09/01/2011 0400   AST 25 09/01/2011 0400   ALT 33 09/01/2011 0400   ALKPHOS 76 09/01/2011 0400   BILITOT 0.7 09/01/2011 0400   GFRNONAA >90 09/01/2011 0400   GFRAA >90 09/01/2011 0400   CT A/P with contrast:  No acute abnormalities  Assessment:  1.  Abdominal pain.  Possibilities include abdominal wall scar tissue; functional pain.  No abnormalities on CT scan, couple prior endoscopies, normal labs. 2.  Nausea & vomiting by report.  Not witnessed by nursing staff. 3.  Diarrhea by report.   Not witnessed by nursing staff.  Plan:  1.  Would NOT escalate pain regimen; in fact, would plan to wean her to oral pain medications tomorrow if at all possible. 2.  Trial of dicyclomine. 3.  Clear liquids only. 4.  Scheduled antiemetics. 5.  Stool studies. 6.  Hopeful discharge in the next couple days.  I don't see any need for colonoscopy at the present time. 7.  I have reviewed my recommendations with Dr. Jomarie Longs [facility]) directly.   Freddy Jaksch 09/01/2011, 11:04 AM

## 2011-09-01 NOTE — Progress Notes (Signed)
Subjective: Asking me for IV dilaudid Q2hours, reports dry heaving, noted by Nurse tech to have water in emesis basin,  Tells me no BM since admission, told RN that she had diarrhea overnight  Objective: Vital signs in last 24 hours: Temp:  [98 F (36.7 C)-98.6 F (37 C)] 98.6 F (37 C) (04/26 0539) Pulse Rate:  [69-79] 76  (04/26 0539) Resp:  [18-20] 20  (04/26 0539) BP: (94-124)/(65-88) 94/65 mmHg (04/26 0539) SpO2:  [93 %-100 %] 93 % (04/26 0539) Weight:  [108.863 kg (240 lb)-110 kg (242 lb 8.1 oz)] 110 kg (242 lb 8.1 oz) (04/25 2028) Weight change:  Last BM Date: 08/31/11  Intake/Output from previous day:    Physical Exam: General: Alert, awake, oriented x3, in no acute distress. HEENT: No bruits, no goiter. Heart: Regular rate and rhythm, without murmurs, rubs, gallops. Lungs: Clear to auscultation bilaterally. Abdomen: Soft, obese, nontender, nondistended, positive bowel sounds. Extremities: No clubbing cyanosis or edema with positive pedal pulses. Neuro: Grossly intact, nonfocal.  Lab Results: Basic Metabolic Panel:  Basename 09/01/11 0400 08/31/11 1342  NA 136 138  K 3.9 3.6  CL 103 106  CO2 24 20  GLUCOSE 99 114*  BUN 8 8  CREATININE 0.69 0.53  CALCIUM 8.9 9.5  MG -- --  PHOS -- --   Liver Function Tests:  Basename 09/01/11 0400 08/31/11 1342  AST 25 21  ALT 33 33  ALKPHOS 76 80  BILITOT 0.7 0.4  PROT 6.9 7.5  ALBUMIN 3.7 4.2    Basename 09/01/11 0400 08/31/11 1342  LIPASE 24 42  AMYLASE -- --   No results found for this basename: AMMONIA:2 in the last 72 hours CBC:  Basename 08/31/11 1342  WBC 8.0  NEUTROABS 5.0  HGB 13.8  HCT 40.1  MCV 88.3  PLT 343   Cardiac Enzymes: No results found for this basename: CKTOTAL:3,CKMB:3,CKMBINDEX:3,TROPONINI:3 in the last 72 hours BNP: No results found for this basename: PROBNP:3 in the last 72 hours D-Dimer: No results found for this basename: DDIMER:2 in the last 72 hours CBG: No results found  for this basename: GLUCAP:6 in the last 72 hours Hemoglobin A1C: No results found for this basename: HGBA1C in the last 72 hours Fasting Lipid Panel: No results found for this basename: CHOL,HDL,LDLCALC,TRIG,CHOLHDL,LDLDIRECT in the last 72 hours Thyroid Function Tests: No results found for this basename: TSH,T4TOTAL,FREET4,T3FREE,THYROIDAB in the last 72 hours Anemia Panel: No results found for this basename: VITAMINB12,FOLATE,FERRITIN,TIBC,IRON,RETICCTPCT in the last 72 hours Coagulation: No results found for this basename: LABPROT:2,INR:2 in the last 72 hours Urine Drug Screen: Drugs of Abuse  No results found for this basename: labopia, cocainscrnur, labbenz, amphetmu, thcu, labbarb    Alcohol Level: No results found for this basename: ETH:2 in the last 72 hours Urinalysis:  Basename 08/31/11 1344  COLORURINE YELLOW  LABSPEC 1.021  PHURINE 7.5  GLUCOSEU NEGATIVE  HGBUR NEGATIVE  BILIRUBINUR NEGATIVE  KETONESUR NEGATIVE  PROTEINUR NEGATIVE  UROBILINOGEN 0.2  NITRITE NEGATIVE  LEUKOCYTESUR MODERATE*   No results found for this or any previous visit (from the past 240 hour(s)).  Studies/Results: Ct Abdomen Pelvis W Contrast  08/31/2011  *RADIOLOGY REPORT*  Clinical Data: Diffuse abdominal pain.  Vomiting and diarrhea.  CT ABDOMEN AND PELVIS WITH CONTRAST  Technique:  Multidetector CT imaging of the abdomen and pelvis was performed following the standard protocol during bolus administration of intravenous contrast.  Contrast: OMNIPAQUE IOHEXOL 300 MG/ML  SOLN  Comparison: 08/11/2011.  Findings: Lung bases show no  acute findings.  Heart is at the upper limits of normal in size.  No pericardial effusion. Trace bilateral pleural effusions.  Liver is unremarkable.  Cholecystectomy.  Adrenal glands, kidneys, spleen, pancreas, stomach and bowel are unremarkable. Postoperative changes and possible catheter fragment are again seen in the left ventral abdominal wall.  Small ventral  hernia contains fat (image 69).  Intrauterine contraceptive device is in place.  Low attenuation lesion in the right adnexa measures 3.2 cm and is likely affiliated with the right ovary, indicative of a physiologic cyst.  Left ovary is visualized.  No pathologically enlarged lymph nodes.  No free fluid. No worrisome lytic or sclerotic lesions.  IMPRESSION:  1.  No findings to explain the patient's given symptoms. 2.  Trace bilateral pleural fluid.  Original Report Authenticated By: Loretta Alexander, M.D.    Medications: Scheduled Meds:   . cefTRIAXone (ROCEPHIN)  IV  1 g Intravenous Once  . cefTRIAXone (ROCEPHIN)  IV  1 g Intravenous Q24H  . diphenhydrAMINE  12.5 mg Intravenous Once  . diphenhydrAMINE  25 mg Intravenous Once  . DULoxetine  60 mg Oral Daily  .  HYDROmorphone (DILAUDID) injection  1 mg Intravenous Once  .  HYDROmorphone (DILAUDID) injection  1 mg Intravenous Once  .  HYDROmorphone (DILAUDID) injection  1 mg Intravenous Once  . ondansetron  4 mg Intravenous Once  . ondansetron  4 mg Intravenous Once  . pantoprazole (PROTONIX) IV  40 mg Intravenous Q24H  . propranolol  40 mg Oral Daily  . sodium chloride  1,000 mL Intravenous Once  . DISCONTD: cefTRIAXone (ROCEPHIN)  IV  1 g Intravenous Q24H  . DISCONTD: pantoprazole (PROTONIX) IV  40 mg Intravenous Q24H   Continuous Infusions:  PRN Meds:.diphenhydrAMINE, HYDROmorphone (DILAUDID) injection, iohexol, ondansetron, promethazine, QUEtiapine  Assessment/Plan: 1. Acute on Chronic Abdominal pain  Unclear etiology, related to adhesions from prior surgeries vs bowel pathology vs functional  CT without evidence of acute pathology/SBO/Ileus  Remarkably electrolytes, Hb normal despite 5 week duration of vomiting, abdominal pain and intermittent hematochezia  IVF, supportive care with anti-emetics, narcotics-wil not increase IV dilaudid, transition to PO tomorrow, IV PPI  Will consult Dr.Outlaw or associates today,  was considering  colonoscopy per Pt  Request records from [facility]  Although she could have something organic, there is also evidence by RNs in ER and on Floor of inconsistencies, and also documented in last hospitalization 2. Possible UTI: continue ceftriaxone, check urine cultures  3. Depression continue Cymbalta/Seroquel  Further management as condition eveolves   LOS: 1 day   [facility] Pager: [phone] 09/01/2011, 8:29 AM

## 2011-09-01 NOTE — Progress Notes (Signed)
When the nursing assistant went in [name] room to do morning vitals, patient asked her to empty her basin which she stated she had thrown up in. According the the NA, it looked like water without any mucus or color to it.   I later inquired with the patient about vomiting during the night and she reported only "dry heaving." Throughout the entire shift, I did not witness the patient vomiting or see any vomit in her room/bathroom. Loretta Form, RN

## 2011-09-01 NOTE — Progress Notes (Signed)
Patient reported vomiting of breakfast jello and coke, nurse tech and nurse observed emesis basin contents and noted strong smell of coca cola, intact chunks of jello, no discernable saliva or bile, and no smell of gastric contents consistent with emesis.

## 2011-09-02 LAB — URINE CULTURE
Colony Count: >=100000
Culture  Setup Time: 201304270041

## 2011-09-02 MED ORDER — OXYCODONE-ACETAMINOPHEN 5-325 MG PO TABS
1.0000 | ORAL_TABLET | ORAL | Status: DC | PRN
  Administered 2011-09-02 (×2): 1 via ORAL
  Filled 2011-09-02 (×2): qty 1

## 2011-09-02 MED ORDER — PANTOPRAZOLE SODIUM 40 MG PO TBEC
40.0000 mg | DELAYED_RELEASE_TABLET | Freq: Every day | ORAL | Status: DC
  Administered 2011-09-02 – 2011-09-03 (×2): 40 mg via ORAL
  Filled 2011-09-02 (×2): qty 1

## 2011-09-02 MED ORDER — PROMETHAZINE HCL 25 MG RE SUPP: 25.0000 mg | Freq: Four times a day (QID) | RECTAL | Status: DC | PRN

## 2011-09-02 NOTE — Progress Notes (Signed)
Subjective: Pain persists.  Diarrhea has stopped  Objective: Vital signs in last 24 hours: Temp:  [98.3 F (36.8 C)-98.9 F (37.2 C)] 98.9 F (37.2 C) (04/27 0546) Pulse Rate:  [70-72] 72  (04/27 0546) Resp:  [18] 18  (04/27 0546) BP: (97-100)/(60-69) 98/60 mmHg (04/27 0546) SpO2:  [95 %-97 %] 97 % (04/27 0546) Weight change:  Last BM Date: 08/31/11  PE: GEN:  Overweight, NAD  Assessment:  1.  Abdominal pain,  Unclear etiology despite extensive evaluation. 2.  Diarrhea, resolved once she was admitted. 3.  Nausea/vomiting per patient.  Not witnessed by nursing staff.  Plan:  1.  Wean narcotics to po.  Avoid IV narcotics. 2.  Advance diet. 3.  Continue dicyclomine 10 mg po qac/qhs. 4.  I feel next best step in management is to follow-up with her surgeons at [facility] and see if there is anything else operatively (re: scar tissue, etc) that should be done, which I doubt. 5.  Happy to consider colonoscopy as outpatient, although it will be low yield, once [facility] surgeons document there is nothing else they have to offer. 6.  If all outpatient evaluation as above is unrevealing, which I suspect, one might consider pain clinic evaluation for her chronic pain syndrome. 7.  I have discussed case extensively with patient, her mother, as well as [facility] Dr. Jomarie Longs.   Freddy Jaksch 09/02/2011, 11:54 AM

## 2011-09-02 NOTE — Progress Notes (Signed)
Pharmacy: IV to PO Protonix  Patient has been receiving IV Protonix. Per Pharmacy and Therapeutics Committee, this patient meets criteria for auto conversion to PO Protonix:   Tolerating a diet of full liquids or better  Tolerating other medications via enteral route  No GIB  Mahlik Lenn PharmD  [phone] 06/05/2011 8:54 AM    

## 2011-09-02 NOTE — Progress Notes (Addendum)
Subjective: Asking me for IV dilaudid Q2hours, reports dry heaving, noted by Nurse tech to have water in emesis basin,  Tells me no BM since admission, told RN that she had diarrhea overnight  Objective: Vital signs in last 24 hours: Temp:  [98.3 F (36.8 C)-98.9 F (37.2 C)] 98.9 F (37.2 C) (04/27 0546) Pulse Rate:  [70-72] 72  (04/27 0546) Resp:  [18] 18  (04/27 0546) BP: (97-100)/(60-69) 98/60 mmHg (04/27 0546) SpO2:  [95 %-97 %] 97 % (04/27 0546) Weight change:  Last BM Date: 08/31/11  Intake/Output from previous day: 04/26 0701 - 04/27 0700 In: -  Out: 700 [Urine:700]  Physical Exam: General: Alert, awake, oriented x3, in no acute distress. HEENT: No bruits, no goiter. Heart: Regular rate and rhythm, without murmurs, rubs, gallops. Lungs: Clear to auscultation bilaterally. Abdomen: Soft, obese, nontender, nondistended, positive bowel sounds. Extremities: No clubbing cyanosis or edema with positive pedal pulses. Neuro: Grossly intact, nonfocal.  Lab Results: Basic Metabolic Panel:  Basename 09/01/11 0400 08/31/11 1342  NA 136 138  K 3.9 3.6  CL 103 106  CO2 24 20  GLUCOSE 99 114*  BUN 8 8  CREATININE 0.69 0.53  CALCIUM 8.9 9.5  MG -- --  PHOS -- --   Liver Function Tests:  Basename 09/01/11 0400 08/31/11 1342  AST 25 21  ALT 33 33  ALKPHOS 76 80  BILITOT 0.7 0.4  PROT 6.9 7.5  ALBUMIN 3.7 4.2    Basename 09/01/11 0400 08/31/11 1342  LIPASE 24 42  AMYLASE -- --   No results found for this basename: AMMONIA:2 in the last 72 hours CBC:  Basename 08/31/11 1342  WBC 8.0  NEUTROABS 5.0  HGB 13.8  HCT 40.1  MCV 88.3  PLT 343   Cardiac Enzymes: No results found for this basename: CKTOTAL:3,CKMB:3,CKMBINDEX:3,TROPONINI:3 in the last 72 hours BNP: No results found for this basename: PROBNP:3 in the last 72 hours D-Dimer: No results found for this basename: DDIMER:2 in the last 72 hours CBG: No results found for this basename: GLUCAP:6 in the  last 72 hours Hemoglobin A1C: No results found for this basename: HGBA1C in the last 72 hours Fasting Lipid Panel: No results found for this basename: CHOL,HDL,LDLCALC,TRIG,CHOLHDL,LDLDIRECT in the last 72 hours Thyroid Function Tests: No results found for this basename: TSH,T4TOTAL,FREET4,T3FREE,THYROIDAB in the last 72 hours Anemia Panel: No results found for this basename: VITAMINB12,FOLATE,FERRITIN,TIBC,IRON,RETICCTPCT in the last 72 hours Coagulation: No results found for this basename: LABPROT:2,INR:2 in the last 72 hours Urine Drug Screen: Drugs of Abuse  No results found for this basename: labopia,  cocainscrnur,  labbenz,  amphetmu,  thcu,  labbarb    Alcohol Level: No results found for this basename: ETH:2 in the last 72 hours Urinalysis:  Basename 08/31/11 1344  COLORURINE YELLOW  LABSPEC 1.021  PHURINE 7.5  GLUCOSEU NEGATIVE  HGBUR NEGATIVE  BILIRUBINUR NEGATIVE  KETONESUR NEGATIVE  PROTEINUR NEGATIVE  UROBILINOGEN 0.2  NITRITE NEGATIVE  LEUKOCYTESUR MODERATE*   No results found for this or any previous visit (from the past 240 hour(s)).  Studies/Results: Ct Abdomen Pelvis W Contrast  08/31/2011  *RADIOLOGY REPORT*  Clinical Data: Diffuse abdominal pain.  Vomiting and diarrhea.  CT ABDOMEN AND PELVIS WITH CONTRAST  Technique:  Multidetector CT imaging of the abdomen and pelvis was performed following the standard protocol during bolus administration of intravenous contrast.  Contrast: OMNIPAQUE IOHEXOL 300 MG/ML  SOLN  Comparison: 08/11/2011.  Findings: Lung bases show no acute findings.  Heart is  at the upper limits of normal in size.  No pericardial effusion. Trace bilateral pleural effusions.  Liver is unremarkable.  Cholecystectomy.  Adrenal glands, kidneys, spleen, pancreas, stomach and bowel are unremarkable. Postoperative changes and possible catheter fragment are again seen in the left ventral abdominal wall.  Small ventral hernia contains fat (image 69).   Intrauterine contraceptive device is in place.  Low attenuation lesion in the right adnexa measures 3.2 cm and is likely affiliated with the right ovary, indicative of a physiologic cyst.  Left ovary is visualized.  No pathologically enlarged lymph nodes.  No free fluid. No worrisome lytic or sclerotic lesions.  IMPRESSION:  1.  No findings to explain the patient's given symptoms. 2.  Trace bilateral pleural fluid.  Original Report Authenticated By: Reyes Ivan, M.D.    Medications: Scheduled Meds:    . cefTRIAXone (ROCEPHIN)  IV  1 g Intravenous Q24H  . dicyclomine  10 mg Oral TID AC & HS  . DULoxetine  60 mg Oral Daily  . ondansetron  4 mg Intravenous TID AC & HS  . pantoprazole (PROTONIX) IV  40 mg Intravenous Q24H  . propranolol  40 mg Oral Daily   Continuous Infusions:  PRN Meds:.oxyCODONE-acetaminophen, promethazine, QUEtiapine, DISCONTD: diphenhydrAMINE, DISCONTD:  HYDROmorphone (DILAUDID) injection, DISCONTD: promethazine  Assessment/Plan: 1. Acute on Chronic Abdominal pain  Unclear etiology, related to adhesions from prior surgeries vs functional etiology CT without evidence of acute pathology/SBO/Ileus  Remarkably electrolytes, Hb normal despite 5 week duration of vomiting, abdominal pain and intermittent hematochezia  IVF, supportive care with anti-emetics,  Change to Oral narcotics, oral phenergan Appreciate Dr.Outlaw's input Although she could have something organic, there is also evidence by RNs in ER and on Floor of inconsistencies in reported symptoms and objective findings, this was also documented in last hospitalization in 11/13 2. Possible UTI: continue ceftriaxone, urine cultures still pending 3. Depression continue Cymbalta/Seroquel  Home soon   LOS: 2 days   [facility] Pager: [phone] 09/02/2011, 11:43 AM

## 2011-09-03 MED ORDER — OXYCODONE-ACETAMINOPHEN 5-325 MG PO TABS: 1.0000 | ORAL_TABLET | ORAL | Status: AC | PRN

## 2011-09-03 MED ORDER — PROMETHAZINE HCL 25 MG PO TABS: 25.0000 mg | ORAL_TABLET | Freq: Four times a day (QID) | ORAL | Status: DC | PRN

## 2011-09-03 MED ORDER — DICYCLOMINE HCL 10 MG PO CAPS: 10.0000 mg | ORAL_CAPSULE | Freq: Three times a day (TID) | ORAL | Status: DC

## 2011-09-03 NOTE — Discharge Summary (Signed)
Physician Discharge Summary  Patient ID: RUPINDER LIVINGSTON MRN: [id number] DOB/AGE: April 27, 1981 31 y.o.  Admit date: 08/31/2011 Discharge date: 09/03/2011  Primary Care Physician:  Sissy Hoff, MD, MD Gynecologist Dr. Candice Camp Surgeon: at [facility]  Discharge Diagnoses:   1. Acute on chronic abdominal pain- secondary to adhesions with functional component 2. Depression 3. Nausea and vomiting-reported but not witnessed 4. history of lysis of adhesions in 2010 at [facility] 5. ovarian cyst    Medication List  As of 09/03/2011  2:27 PM   STOP taking these medications         promethazine 25 MG suppository      ZOFRAN PO         TAKE these medications         dicyclomine 10 MG capsule   Commonly known as: BENTYL   Take 1 capsule (10 mg total) by mouth 4 (four) times daily -  before meals and at bedtime.      DULoxetine 60 MG capsule   Commonly known as: CYMBALTA   Take 60 mg by mouth daily.      LUPRON DEPOT IM   Inject 1 Syringe into the muscle every 3 (three) months.      oxyCODONE-acetaminophen 5-325 MG per tablet   Commonly known as: PERCOCET   Take 1 tablet by mouth every 4 (four) hours as needed.      pantoprazole 40 MG tablet   Commonly known as: PROTONIX   Take 40 mg by mouth daily.      promethazine 25 MG tablet   Commonly known as: PHENERGAN   Take 1 tablet (25 mg total) by mouth every 6 (six) hours as needed.      propranolol 40 MG tablet   Commonly known as: INDERAL   Take 40 mg by mouth daily.      QUEtiapine 25 MG tablet   Commonly known as: SEROQUEL   Take 25 mg by mouth at bedtime as needed. For sleep.           Disposition and Follow-up:  Surgeon: At [facility] in 2-3 weeks GI at [facility] or Dr. Hinda Lenis [facility] in 2-3 weeks  Consults:  Dr.Outlaw, [facility]  Significant Diagnostic Studies:  CT ABD/PELVIS IMPRESSION: 1. No findings to explain the patient's given symptoms. 2. Trace bilateral pleural fluid.   Brief  H and P: Ms.Garner is a 31year old female with h/o chronic abdominal pain and multiple abdominal surgeries presents with the above mentioned complaint.  This time around she reports severe right sided and diffuse abdominal pain, associated with profuse nausea and vomiting for last 5weeks, exacerbated by eating.  she has been multiple times to different ERs during this interval. She also reports intermittent diarrhea and constipation occasionally with blood in stool.  Previously had lysis of adhesions in 2010 at [facility], [place].  Also being treated for ovarian cyst with Lupron.  Last admission at [facility] by [facility] in 11/12, workup was unremarkable, EGD with mild gastritis and concern about discrepancy between objective findings and reported symptoms.  Denies any fevers or chills  Day of admission,  she was seen at [facility] GI office where she was crying and curled up in a fetal position and was advised to come to ER.  In ER, had CT Abd/pelvis which is unremarkable, and normal CBC, LFTs, and electrolytes   Hospital Course:   1. Abdominal pain,  nausea and vomiting She has been followed by  a Careers adviser at [facility], had lysis of adhesions in 2010 Patient complains of abdominal pain nausea and vomiting with intermittent diarrhea and hematochezia for 5 weeks prior to admission, however her hemoglobin, electrolytes, bicarbonate was completely within normal range. She had a CT of her abdomen pelvis on admission as well as few weeks ago which is completely normal. During her previous ER stay she was witnessed by ER RN pouring contrast into the emesis basin and claiming that she vomited. She was admitted to the hospital started on supportive care with IV fluids antiemetics and narcotics for symptomatic treatment. She reported several episodes of vomiting and diarrhea however none of this was witnessed in the hospital. Was seen by Dr.Outlaw in consultation who also agreed with supportive care, added  dicyclomine, her diet was slowly advanced and she was weaned off IV narcotics. He felt that the next best step would be follow up with her surgeons at [facility] if to see if anything  needed to be done operatively with regards to scar tissue. He also felt that he would be willing to consider a colonoscopy as outpatient although it would be low yield. If all of the above outpatient evaluation was unrevealing, could consider pain clinic evaluation of her chronic pain syndrome although with risk of narcotic dependence.  Time spent on Discharge:  Signed: Karalyn Kadel [facility] Pager: [phone] 09/03/2011, 2:27 PM

## 2011-12-21 ENCOUNTER — Emergency Department: Payer: Self-pay | Admitting: Emergency Medicine

## 2011-12-21 LAB — URINALYSIS, COMPLETE
Bilirubin,UR: NEGATIVE
Ph: 6 (ref 4.5–8.0)
Specific Gravity: 1.021 (ref 1.003–1.030)
Squamous Epithelial: 58

## 2011-12-21 LAB — WET PREP, GENITAL

## 2011-12-21 LAB — PREGNANCY, URINE: Pregnancy Test, Urine: NEGATIVE m[IU]/mL

## 2012-01-11 ENCOUNTER — Emergency Department (HOSPITAL_COMMUNITY)
Admission: EM | Admit: 2012-01-11 | Discharge: 2012-01-11 | Disposition: A | Discharge status: Home / Self Care | Payer: BC Managed Care – PPO | Attending: Emergency Medicine | Admitting: Emergency Medicine

## 2012-01-11 ENCOUNTER — Emergency Department (HOSPITAL_COMMUNITY): Payer: BC Managed Care – PPO

## 2012-01-11 ENCOUNTER — Encounter (HOSPITAL_COMMUNITY): Payer: Self-pay | Admitting: Emergency Medicine

## 2012-01-11 DIAGNOSIS — G8929 Other chronic pain: Secondary | ICD-10-CM

## 2012-01-11 DIAGNOSIS — K5900 Constipation, unspecified: Secondary | ICD-10-CM | POA: Diagnosis present

## 2012-01-11 DIAGNOSIS — R1031 Right lower quadrant pain: Secondary | ICD-10-CM | POA: Insufficient documentation

## 2012-01-11 DIAGNOSIS — R739 Hyperglycemia, unspecified: Secondary | ICD-10-CM

## 2012-01-11 DIAGNOSIS — F3289 Other specified depressive episodes: Secondary | ICD-10-CM | POA: Insufficient documentation

## 2012-01-11 DIAGNOSIS — K2990 Gastroduodenitis, unspecified, without bleeding: Secondary | ICD-10-CM

## 2012-01-11 DIAGNOSIS — F329 Major depressive disorder, single episode, unspecified: Secondary | ICD-10-CM | POA: Insufficient documentation

## 2012-01-11 DIAGNOSIS — Z765 Malingerer [conscious simulation]: Secondary | ICD-10-CM | POA: Diagnosis present

## 2012-01-11 DIAGNOSIS — R109 Unspecified abdominal pain: Secondary | ICD-10-CM | POA: Diagnosis present

## 2012-01-11 DIAGNOSIS — K2970 Gastritis, unspecified, without bleeding: Secondary | ICD-10-CM

## 2012-01-11 DIAGNOSIS — R7309 Other abnormal glucose: Secondary | ICD-10-CM | POA: Insufficient documentation

## 2012-01-11 DIAGNOSIS — E669 Obesity, unspecified: Secondary | ICD-10-CM | POA: Diagnosis present

## 2012-01-11 DIAGNOSIS — R112 Nausea with vomiting, unspecified: Secondary | ICD-10-CM

## 2012-01-11 DIAGNOSIS — Z9089 Acquired absence of other organs: Secondary | ICD-10-CM | POA: Insufficient documentation

## 2012-01-11 HISTORY — DX: Malingerer (conscious simulation): Z76.5

## 2012-01-11 HISTORY — DX: Gastritis, unspecified, without bleeding: K29.70

## 2012-01-11 HISTORY — DX: Obesity, unspecified: E66.9

## 2012-01-11 HISTORY — DX: Unspecified ovarian cyst, right side: N83.201

## 2012-01-11 HISTORY — DX: Unspecified intestinal obstruction, unspecified as to partial versus complete obstruction: K56.609

## 2012-01-11 LAB — CBC WITH DIFFERENTIAL/PLATELET
Eosinophils Absolute: 0.3 10*3/uL (ref 0.0–0.7)
HCT: 40.6 % (ref 36.0–46.0)
Hemoglobin: 14 g/dL (ref 12.0–15.0)
Lymphs Abs: 2.1 10*3/uL (ref 0.7–4.0)
MCH: 30.8 pg (ref 26.0–34.0)
Monocytes Relative: 6 % (ref 3–12)
Neutro Abs: 4.7 10*3/uL (ref 1.7–7.7)
Neutrophils Relative %: 62 % (ref 43–77)
RBC: 4.55 MIL/uL (ref 3.87–5.11)

## 2012-01-11 LAB — LIPASE, BLOOD: Lipase: 37 U/L (ref 11–59)

## 2012-01-11 LAB — URINALYSIS, ROUTINE W REFLEX MICROSCOPIC
Bilirubin Urine: NEGATIVE
Ketones, ur: NEGATIVE mg/dL
Nitrite: NEGATIVE
Protein, ur: NEGATIVE mg/dL
pH: 6 (ref 5.0–8.0)

## 2012-01-11 LAB — COMPREHENSIVE METABOLIC PANEL
Alkaline Phosphatase: 92 U/L (ref 39–117)
BUN: 10 mg/dL (ref 6–23)
Chloride: 105 mEq/L (ref 96–112)
GFR calc Af Amer: >90 mL/min (ref >90)
Glucose, Bld: 227 mg/dL — ABNORMAL HIGH (ref 70–99)
Potassium: 3.8 mEq/L (ref 3.5–5.1)
Total Bilirubin: 0.3 mg/dL (ref 0.3–1.2)

## 2012-01-11 LAB — URINE MICROSCOPIC-ADD ON

## 2012-01-11 LAB — GLUCOSE, CAPILLARY: Glucose-Capillary: 103 mg/dL — ABNORMAL HIGH (ref 70–99)

## 2012-01-11 MED ORDER — POLYETHYLENE GLYCOL 3350 17 G PO PACK: 17.0000 g | PACK | Freq: Every day | ORAL | Status: AC

## 2012-01-11 MED ORDER — PANTOPRAZOLE SODIUM 40 MG PO TBEC: 40.0000 mg | DELAYED_RELEASE_TABLET | Freq: Every day | ORAL | Status: DC

## 2012-01-11 MED ORDER — PROMETHAZINE HCL 25 MG PO TABS: 25.0000 mg | ORAL_TABLET | Freq: Four times a day (QID) | ORAL | Status: DC | PRN

## 2012-01-11 MED ORDER — HYDROMORPHONE HCL PF 1 MG/ML IJ SOLN
1.0000 mg | Freq: Once | INTRAMUSCULAR | Status: AC
  Administered 2012-01-11: 1 mg via INTRAVENOUS
  Filled 2012-01-11: qty 1

## 2012-01-11 MED ORDER — ONDANSETRON HCL 4 MG/2ML IJ SOLN
4.0000 mg | Freq: Once | INTRAMUSCULAR | Status: AC
  Administered 2012-01-11: 4 mg via INTRAVENOUS
  Filled 2012-01-11: qty 2

## 2012-01-11 MED ORDER — PROMETHAZINE HCL 25 MG/ML IJ SOLN
12.5000 mg | Freq: Once | INTRAMUSCULAR | Status: AC
  Administered 2012-01-11: 12.5 mg via INTRAVENOUS
  Filled 2012-01-11: qty 1

## 2012-01-11 MED ORDER — LORAZEPAM 2 MG/ML IJ SOLN
0.5000 mg | Freq: Once | INTRAMUSCULAR | Status: AC
  Administered 2012-01-11: 0.5 mg via INTRAVENOUS
  Filled 2012-01-11: qty 1

## 2012-01-11 MED ORDER — SODIUM CHLORIDE 0.9 % IV BOLUS (SEPSIS)
1000.0000 mL | Freq: Once | INTRAVENOUS | Status: AC
  Administered 2012-01-11: 1000 mL via INTRAVENOUS

## 2012-01-11 MED ORDER — OXYCODONE HCL 5 MG PO TABS: 5.0000 mg | ORAL_TABLET | ORAL | Status: AC | PRN

## 2012-01-11 MED ORDER — HYDROMORPHONE HCL PF 1 MG/ML IJ SOLN
1.0000 mg | Freq: Once | INTRAMUSCULAR | Status: AC
  Administered 2012-01-11: 1 mg via INTRAVENOUS

## 2012-01-11 MED ORDER — PROMETHAZINE HCL 25 MG/ML IJ SOLN
25.0000 mg | Freq: Once | INTRAMUSCULAR | Status: AC
  Administered 2012-01-11: 25 mg via INTRAVENOUS
  Filled 2012-01-11: qty 1

## 2012-01-11 MED ORDER — PANTOPRAZOLE SODIUM 40 MG IV SOLR
40.0000 mg | Freq: Once | INTRAVENOUS | Status: AC
  Administered 2012-01-11: 40 mg via INTRAVENOUS
  Filled 2012-01-11: qty 40

## 2012-01-11 MED ORDER — MORPHINE SULFATE 4 MG/ML IJ SOLN
4.0000 mg | Freq: Once | INTRAMUSCULAR | Status: AC
  Administered 2012-01-11: 4 mg via INTRAVENOUS
  Filled 2012-01-11: qty 1

## 2012-01-11 MED ORDER — HYDROMORPHONE HCL PF 1 MG/ML IJ SOLN
INTRAMUSCULAR | Status: AC
  Filled 2012-01-11: qty 1

## 2012-01-11 MED ORDER — DICYCLOMINE HCL 10 MG/ML IM SOLN
20.0000 mg | Freq: Once | INTRAMUSCULAR | Status: AC
  Administered 2012-01-11: 20 mg via INTRAMUSCULAR
  Filled 2012-01-11: qty 2

## 2012-01-11 MED ORDER — HYDROMORPHONE HCL PF 1 MG/ML IJ SOLN
1.0000 mg | Freq: Once | INTRAMUSCULAR | Status: AC
  Administered 2012-01-11: 1 mg via INTRAMUSCULAR
  Filled 2012-01-11: qty 1

## 2012-01-11 NOTE — ED Notes (Signed)
Patient requesting pain medication. Burgess Amor, PA notified.

## 2012-01-11 NOTE — Consult Note (Signed)
Loretta Alexander MRN: [id number] DOB/AGE: 11-02-1980 31 y.o. Primary Care Physician:SWAYNE,DAVID W, MD Consult date: 01/11/2012 Referring physician: Carleene Cooper, M.D. [name], [place]) Reason for consult: Right-sided abdominal pain, nausea, and vomiting. HPI: The patient is a 31 year old woman with a history significant for multiple abdominal surgeries, lysis of adhesions at [facility] in 2010, migraine headaches, and depression, who presents to the emergency department today with a chief complaint of abdominal pain. She has a history of appendectomy, cholecystectomy, hernia repair, gastric banding and then reversal, and right oophorectomy. She has chronic abdominal pain, but the etiology is not clearly known. She gives a history of a ovarian cyst remnant for which she is being treated with Lupron injections by her general surgeon at [facility]. She is not sure if they are helping. She was given a diagnosis of endometriosis, but she does not endorse this diagnosis. Her abdominal pain started approximately 3 days ago. It is located primarily on the right side. It has been consistent and persistent. She rates the pain a 10 over 10 in intensity. She took ibuprofen, but it did not relieve her pain. She reports multiple episodes of nausea and vomiting over the past 24 hours. She also reports several loose and watery stools. She denies coffee grounds emesis or bright red blood in her emesis. She denies black tarry stools or bright red blood in her stools. She denies pain with urination. She denies associated fever or chills.  In the emergency department, she is noted to be afebrile and hemodynamically stable. Her lab data are virtually unremarkable with exception of a venous glucose of 227. Specifically, her electrolytes are normal. Her lipase is normal. Her liver transaminases are normal. Her urinalysis reveals 7-10 WBCs, but negative for nitrite. Her acute abdominal series reveals no acute findings, but there is  scattered stool throughout the bowel.  Of note, the patient has received a total of 4 milligrams of Dilaudid, 4 mg of morphine, 12 mg of Zofran, 25 mg of Phenergan, 20 mg of intramuscular Bentyl, and 0.5 mg of IV Ativan. She is also received 2 L of IV fluids. She says that she is still in pain.  Past Medical History  Diagnosis Date  . Migraines   . Pneumonia     04/2010  . Eye infection     within last 2 weeks  . Depression   . Complication of anesthesia     Difficult IV stick with Central line placement  . Renal disorder     Kidney Stones  . Intestinal obstruction   . Gastritis 03/2011    Per EGD  . Obesity   . Ovarian cyst, right     Hx of oophorectomy for adhesions  . Drug-seeking behavior     Past Surgical History  Procedure Date  . Tonsillectomy 1991  . Appendectomy 1992  . Cholecystectomy 2003  . Right ovary removed 2006  . Abdominal adhesion surgery 2006  . Knee arthroscopy 2001, 2010    left knee x 2  . Hernia repair     umbilical  . Nasal septoplasty w/ turbinoplasty 03/16/2011    Procedure: NASAL SEPTOPLASTY WITH TURBINATE REDUCTION;  Surgeon: Susy Frizzle, MD;  Location: [facility] OR;  Service: ENT;  Laterality: Bilateral;  . Laparoscopic gastric banding   . Removal of lap band   . Pilonidal cyst excision   . Esophagogastroduodenoscopy 04/05/2011    Procedure: ESOPHAGOGASTRODUODENOSCOPY (EGD);  Surgeon: Charna Elizabeth, MD;  Location: [facility] ENDOSCOPY;  Service: Endoscopy;  Laterality: N/A;  Prior to Admission medications   Medication Sig Start Date End Date Taking? Authorizing Provider  DULoxetine (CYMBALTA) 60 MG capsule Take 60 mg by mouth daily.     Yes Historical Provider, MD  ibuprofen (ADVIL,MOTRIN) 200 MG tablet Take 400 mg by mouth every 6 (six) hours as needed. Headache.   Yes Historical Provider, MD  levonorgestrel (MIRENA) 20 MCG/24HR IUD 1 each by Intrauterine route once.   Yes Historical Provider, MD  propranolol (INDERAL) 40 MG tablet Take 40 mg by mouth  daily.    Historical Provider, MD  QUEtiapine (SEROQUEL) 25 MG tablet Take 25 mg by mouth at bedtime as needed. For sleep.    Historical Provider, MD    Allergies:  Allergies  Allergen Reactions  . Toradol (Ketorolac Tromethamine) Nausea And Vomiting  . Compazine (Prochlorperazine Maleate) Anxiety and Other (See Comments)    dizziness  . Haloperidol And Related Anxiety and Other (See Comments)    dizziness  . Reglan (Metoclopramide Hcl) Anxiety and Other (See Comments)    dizziness    Family history: Her mother is 39 years of age and has no significant medical history. Her father is deceased. He had a history of Hodgkin's lymphoma, but died of a heart attack.  Social History: She is divorced. 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 She lives in [place]. She has no children. She denies tobacco and illicit drug use. She drinks alcohol on occasion.   ROS: As above in history present illness, otherwise negative.  PHYSICAL EXAM: Blood pressure 94/64, pulse 94, temperature 98.3 F (36.8 C), temperature source Oral, resp. rate 18, height 5\' 2"  (1.575 m), weight 104.327 kg (230 lb), SpO2 98.00%. General: Obese 31 year old Caucasian woman lying in bed, in no acute distress. HEENT: Head is cephalic, nontraumatic. Pupils are equal, round, and reactive to light. Extraocular movements are intact. Conjunctivae are clear. Sclerae are white. Oropharynx reveals moist membranes. No posterior exudates or erythema. Neck is supple, no adenopathy, no thyromegaly, no JVD. Lungs: Clear to auscultation bilaterally. Heart: S1, S2, with no murmurs rubs or gallops. Abdomen: Obese, positive bowel sounds, soft, mildly tender over the right lower quadrant, no distention, no masses palpated, mild voluntary guarding. GU and rectal deferred. Extremities: No pedal edema. Pedal pulses palpable. Neuro/psychiatric: She is alert and oriented x3. Cranial nerves II through XII are intact. Her speech is  clear. She is cooperative. Flat affect.    Basic Metabolic Panel:  Basename 01/11/12 0955  NA 137  K 3.8  CL 105  CO2 22  GLUCOSE 227*  BUN 10  CREATININE 0.58  CALCIUM 9.4  MG --  PHOS --   Liver Function Tests:  [facility] 01/11/12 0955  AST 19  ALT 27  ALKPHOS 92  BILITOT 0.3  PROT 7.0  ALBUMIN 3.6    Basename 01/11/12 0955  LIPASE 37  AMYLASE --   No results found for this basename: AMMONIA:2 in the last 72 hours CBC:  Basename 01/11/12 0955  WBC 7.5  NEUTROABS 4.7  HGB 14.0  HCT 40.6  MCV 89.2  PLT 312   Cardiac Enzymes: No results found for this basename: CKTOTAL:3,CKMB:3,CKMBINDEX:3,TROPONINI:3 in the last 72 hours BNP: No results found for this basename: PROBNP:3 in the last 72 hours D-Dimer: No results found for this basename: DDIMER:2 in the last 72 hours CBG: No results found for this basename: GLUCAP:6 in the last 72 hours Hemoglobin A1C: No results found for this basename: HGBA1C in the last 72 hours Fasting  Lipid Panel: No results found for this basename: CHOL,HDL,LDLCALC,TRIG,CHOLHDL,LDLDIRECT in the last 72 hours Thyroid Function Tests: No results found for this basename: TSH,T4TOTAL,FREET4,T3FREE,THYROIDAB in the last 72 hours Anemia Panel: No results found for this basename: VITAMINB12,FOLATE,FERRITIN,TIBC,IRON,RETICCTPCT in the last 72 hours Coagulation: No results found for this basename: LABPROT:2,INR:2 in the last 72 hours Urine Drug Screen: Drugs of Abuse  No results found for this basename: labopia,  cocainscrnur,  labbenz,  amphetmu,  thcu,  labbarb    Alcohol Level: No results found for this basename: ETH:2 in the last 72 hours Urinalysis:  Basename 01/11/12 0921  COLORURINE YELLOW  LABSPEC 1.020  PHURINE 6.0  GLUCOSEU NEGATIVE  HGBUR TRACE*  BILIRUBINUR NEGATIVE  KETONESUR NEGATIVE  PROTEINUR NEGATIVE  UROBILINOGEN 0.2  NITRITE NEGATIVE  LEUKOCYTESUR SMALL*   Misc. Labs:    No results found for this or any  previous visit (from the past 240 hour(s)).   Results for orders placed during the hospital encounter of 01/11/12 (from the past 48 hour(s))  URINALYSIS, ROUTINE W REFLEX MICROSCOPIC     Status: Abnormal   Collection Time   01/11/12  9:21 AM      Component Value Range Comment   Color, Urine YELLOW  YELLOW    APPearance CLEAR  CLEAR    Specific Gravity, Urine 1.020  1.005 - 1.030    pH 6.0  5.0 - 8.0    Glucose, UA NEGATIVE  NEGATIVE mg/dL    Hgb urine dipstick TRACE (*) NEGATIVE    Bilirubin Urine NEGATIVE  NEGATIVE    Ketones, ur NEGATIVE  NEGATIVE mg/dL    Protein, ur NEGATIVE  NEGATIVE mg/dL    Urobilinogen, UA 0.2  0.0 - 1.0 mg/dL    Nitrite NEGATIVE  NEGATIVE    Leukocytes, UA SMALL (*) NEGATIVE   URINE MICROSCOPIC-ADD ON     Status: Abnormal   Collection Time   01/11/12  9:21 AM      Component Value Range Comment   Squamous Epithelial / LPF FEW (*) RARE    WBC, UA 7-10  <3 WBC/hpf    RBC / HPF 3-6  <3 RBC/hpf    Bacteria, UA FEW (*) RARE   PREGNANCY, URINE     Status: Normal   Collection Time   01/11/12  9:50 AM      Component Value Range Comment   Preg Test, Ur NEGATIVE  NEGATIVE   CBC WITH DIFFERENTIAL     Status: Normal   Collection Time   01/11/12  9:55 AM      Component Value Range Comment   WBC 7.5  4.0 - 10.5 K/uL    RBC 4.55  3.87 - 5.11 MIL/uL    Hemoglobin 14.0  12.0 - 15.0 g/dL    HCT 16.1  09.6 - 04.5 %    MCV 89.2  78.0 - 100.0 fL    MCH 30.8  26.0 - 34.0 pg    MCHC 34.5  30.0 - 36.0 g/dL    RDW 40.9  81.1 - 91.4 %    Platelets 312  150 - 400 K/uL    Neutrophils Relative 62  43 - 77 %    Neutro Abs 4.7  1.7 - 7.7 K/uL    Lymphocytes Relative 28  12 - 46 %    Lymphs Abs 2.1  0.7 - 4.0 K/uL    Monocytes Relative 6  3 - 12 %    Monocytes Absolute 0.4  0.1 - 1.0 K/uL  Eosinophils Relative 4  0 - 5 %    Eosinophils Absolute 0.3  0.0 - 0.7 K/uL    Basophils Relative 0  0 - 1 %    Basophils Absolute 0.0  0.0 - 0.1 K/uL   COMPREHENSIVE METABOLIC PANEL      Status: Abnormal   Collection Time   01/11/12  9:55 AM      Component Value Range Comment   Sodium 137  135 - 145 mEq/L    Potassium 3.8  3.5 - 5.1 mEq/L    Chloride 105  96 - 112 mEq/L    CO2 22  19 - 32 mEq/L    Glucose, Bld 227 (*) 70 - 99 mg/dL    BUN 10  6 - 23 mg/dL    Creatinine, Ser 9.81  0.50 - 1.10 mg/dL    Calcium 9.4  8.4 - 19.1 mg/dL    Total Protein 7.0  6.0 - 8.3 g/dL    Albumin 3.6  3.5 - 5.2 g/dL    AST 19  0 - 37 U/L    ALT 27  0 - 35 U/L    Alkaline Phosphatase 92  39 - 117 U/L    Total Bilirubin 0.3  0.3 - 1.2 mg/dL    GFR calc non Af Amer >90  >90 mL/min    GFR calc Af Amer >90  >90 mL/min   LIPASE, BLOOD     Status: Normal   Collection Time   01/11/12  9:55 AM      Component Value Range Comment   Lipase 37  11 - 59 U/L     Dg Abd Acute W/chest  01/11/2012  *RADIOLOGY REPORT*  Clinical Data: Abdominal pain, nausea, vomiting  ACUTE ABDOMEN SERIES (ABDOMEN 2 VIEW & CHEST 1 VIEW)  Comparison: 04/03/2011, 04/09/2011  Findings: Low volume chest exam with right hemidiaphragm elevation. Minimal streaky basilar atelectasis.  Normal heart size and vascularity.  No focal pneumonia, collapse, consolidation, edema, effusion, pneumothorax.  No free air.  Previous cholecystectomy noted.  Scattered air and stool throughout the bowel.  Negative for obstruction.  IUD in the pelvis.  Left pelvic calcifications consistent with venous phleboliths.  No abnormal osseous finding.  IMPRESSION: No acute finding by plain radiography   Original Report Authenticated By: Judie Petit. Ruel Favors, M.D.     Impression:  Principal Problem:  *Right sided abdominal pain Active Problems:  Nausea and vomiting  Gastritis  Depression  Hyperglycemia  Obesity  Stool in colon, query constipation.    This is a 31 year old woman with a history significant for multiple surgeries, intestinal adhesions, mild gastritis seen on EGD in November 2012, chronic abdominal pain. After multiple doses of analgesics,  antiemetics, and antispasmodic, she reports ongoing abdominal pain. She also reports multiple episodes of nausea and vomiting over the past 24-48 hours, however, her electrolytes are completely normal which is not consistent with what would be seen. During the previous hospitalization in April, there was never a witnessed episode of vomiting or emesis. However,  the patient was seen pouring contrast in a basin and saying that it was her emesis. She has not had witnessed vomiting in the emergency department today. Apparently she was given some ice chips and she stated that she had vomited it, but according to the registered nurse, all it appeared to be was just melted ice or plain water without any gastric contents. Her acute abdominal series does reveal stool, but not necessarily constipation. Previous CT scans in April 2013  revealed no particular pathology. There is a suspicion of drug-seeking behavior currently. I believe a trial of IV Protonix., another dose of IV hydromorphone and Phenergan can be tried. If these measures do not improve her pain, it is unlikely that hospitalization would improve her symptomatology. I do not favor admitting her for ongoing IV analgesics given that there is no acute pathology suspected.  She is noted to have hyperglycemia but no history of diabetes mellitus. A followup capillary blood glucose was 103.   Recommendations: 1. Give another dose of Dilaudid 1 mg IV, Phenergan 12.5 mg IV, and 40 mg of IV Protonix. 2. Give a trial of ginger ale and saltine crackers. 3. MiraLax chronically for possible constipation.   After the measures above, upon reevaluation, the patient's pain is now 4/10 in intensity. She is tolerating sips of ginger ale and saltine crackers. Additional recommendations: 4. Discharge to home on a clear liquid diet. Advance diet as tolerated. This was discussed with the patient. She was also encouraged to try ginger tea which will help with nausea. 5.  Phenergan 25 mg every 4-6 hours as needed; oxycodone every 6 hours as needed; Protonix 40 mg daily; MiraLax 17 g added to water or favorite beverage daily. The patient was advised to followup with her primary care physician in 3-4 days and with her general surgeon next week in [facility] as needed. This was discussed with the patient and her family who were all in agreement. The plan was also discussed with ED physician Dr. Fonnie Jarvis.      Dijon Cosens 01/11/2012, 5:01 PM

## 2012-01-11 NOTE — ED Notes (Signed)
Patient reports right sided abdominal pain x 3 days with nausea and vomiting for 2 days. H/o obstruction in past.

## 2012-01-11 NOTE — ED Notes (Signed)
Patient with no complaints at this time. Respirations even and unlabored. Skin warm/dry. Discharge instructions reviewed with patient at this time. Patient given opportunity to voice concerns/ask questions. IV removed per policy and band-aid applied to site. Patient discharged at this time and left Emergency Department with steady gait.  

## 2012-01-11 NOTE — ED Provider Notes (Signed)
Pt seen by [facility] who recommended discharge meds and plan as ordered for [facility].[id number]  Hurman Horn, MD 01/12/12 2246

## 2012-01-11 NOTE — ED Provider Notes (Signed)
History     CSN: [id number]  Arrival date & time 01/11/12  0903   First MD Initiated Contact with Patient 01/11/12 [phone]      Chief Complaint  Patient presents with  . Abdominal Pain    (Consider location/radiation/quality/duration/timing/severity/associated sxs/prior treatment) HPI Comments: Loretta Alexander presents with acute on chronic right sided abdominal pain which is been present for the past 3 days and has been progressing and now including nausea with vomiting over the past 2 days.  She has a history of intestinal obstruction in the past secondary to adhesions and is followed at [facility] for this.  She has had multiple surgical procedures including an appendectomy, cholecystectomy hernia repair, gastric banding and then reversal and a right oophorectomy which was directly related to complications from adhesions.  She denies fevers or chills.  She has also had no diarrhea and no hematemesis.  She has used ibuprofen with no relief of her symptoms.  She was last admitted at [facility] in April for similar symptoms at which time her evaluation including CT scans were negative for acute disease.  Patient is a 31 y.o. female presenting with abdominal pain. The history is provided by the patient.  Abdominal Pain The primary symptoms of the illness include abdominal pain, nausea and vomiting. The primary symptoms of the illness do not include fever, shortness of breath or diarrhea.  Symptoms associated with the illness do not include constipation.    Past Medical History  Diagnosis Date  . Migraines   . Pneumonia     04/2010  . Eye infection     within last 2 weeks  . Depression   . Complication of anesthesia     Difficult IV stick with Central line placement  . Renal disorder     Kidney Stones    Past Surgical History  Procedure Date  . Tonsillectomy 1991  . Appendectomy 1992  . Cholecystectomy 2003  . Right ovary removed 2006  . Abdominal adhesion surgery 2006  . Knee  arthroscopy 2001, 2010    left knee x 2  . Hernia repair     umbilical  . Nasal septoplasty w/ turbinoplasty 03/16/2011    Procedure: NASAL SEPTOPLASTY WITH TURBINATE REDUCTION;  Surgeon: Susy Frizzle, MD;  Location: [facility] OR;  Service: ENT;  Laterality: Bilateral;  . Laparoscopic gastric banding   . Removal of lap band   . Pilonidal cyst excision   . Esophagogastroduodenoscopy 04/05/2011    Procedure: ESOPHAGOGASTRODUODENOSCOPY (EGD);  Surgeon: Charna Elizabeth, MD;  Location: [facility];  Service: Endoscopy;  Laterality: N/A;    No family history on file.  History  Substance Use Topics  . Smoking status: Never Smoker   . Smokeless tobacco: Never Used  . Alcohol Use: Yes     rarely    OB History    Grav Para Term Preterm Abortions TAB SAB Ect Mult Living                  Review of Systems  Constitutional: Negative for fever.  HENT: Negative for congestion, sore throat and neck pain.   Eyes: Negative.   Respiratory: Negative for chest tightness and shortness of breath.   Cardiovascular: Negative for chest pain.  Gastrointestinal: Positive for nausea, vomiting and abdominal pain. Negative for diarrhea, constipation, blood in stool and abdominal distention.  Genitourinary: Negative.   Musculoskeletal: Negative for joint swelling and arthralgias.  Skin: Negative.  Negative for rash and wound.  Neurological: Negative for dizziness,  weakness, light-headedness, numbness and headaches.  Hematological: Negative.   Psychiatric/Behavioral: Negative.     Allergies  Toradol; Compazine; Haloperidol and related; and Reglan  Home Medications   Current Outpatient Rx  Name Route Sig Dispense Refill  . DULOXETINE HCL 60 MG PO CPEP Oral Take 60 mg by mouth daily.      . IBUPROFEN 200 MG PO TABS Oral Take 400 mg by mouth every 6 (six) hours as needed. Headache.    Marland Kitchen LEVONORGESTREL 20 MCG/24HR IU IUD Intrauterine 1 each by Intrauterine route once.    Marland Kitchen PROPRANOLOL HCL 40 MG PO TABS Oral  Take 40 mg by mouth daily.    . QUETIAPINE FUMARATE 25 MG PO TABS Oral Take 25 mg by mouth at bedtime as needed. For sleep.      BP 122/77  Pulse 94  Temp 98.3 F (36.8 C) (Oral)  Resp 18  Ht 5\' 2"  (1.575 m)  Wt 230 lb (104.327 kg)  BMI 42.07 kg/m2  SpO2 98%  Physical Exam  Nursing note and vitals reviewed. Constitutional: She appears well-developed and well-nourished.  HENT:  Head: Normocephalic and atraumatic.  Eyes: Conjunctivae are normal.  Neck: Normal range of motion.  Cardiovascular: Normal rate, regular rhythm, normal heart sounds and intact distal pulses.   Pulmonary/Chest: Effort normal and breath sounds normal. She has no wheezes.  Abdominal: Soft. Bowel sounds are normal. She exhibits no mass. There is no hepatosplenomegaly. There is tenderness in the right upper quadrant and right lower quadrant. There is no rebound, no guarding and no CVA tenderness.  Musculoskeletal: Normal range of motion.  Neurological: She is alert.  Skin: Skin is warm and dry.  Psychiatric: She has a normal mood and affect.    ED Course  Procedures (including critical care time)  Labs Reviewed  URINALYSIS, ROUTINE W REFLEX MICROSCOPIC - Abnormal; Notable for the following:    Hgb urine dipstick TRACE (*)     Leukocytes, UA SMALL (*)     All other components within normal limits  URINE MICROSCOPIC-ADD ON - Abnormal; Notable for the following:    Squamous Epithelial / LPF FEW (*)     Bacteria, UA FEW (*)     All other components within normal limits  COMPREHENSIVE METABOLIC PANEL - Abnormal; Notable for the following:    Glucose, Bld 227 (*)     All other components within normal limits  CBC WITH DIFFERENTIAL  LIPASE, BLOOD  PREGNANCY, URINE  URINE CULTURE   Dg Abd Acute W/chest  01/11/2012  *RADIOLOGY REPORT*  Clinical Data: Abdominal pain, nausea, vomiting  ACUTE ABDOMEN SERIES (ABDOMEN 2 VIEW & CHEST 1 VIEW)  Comparison: 04/03/2011, 04/09/2011  Findings: Low volume chest exam with  right hemidiaphragm elevation. Minimal streaky basilar atelectasis.  Normal heart size and vascularity.  No focal pneumonia, collapse, consolidation, edema, effusion, pneumothorax.  No free air.  Previous cholecystectomy noted.  Scattered air and stool throughout the bowel.  Negative for obstruction.  IUD in the pelvis.  Left pelvic calcifications consistent with venous phleboliths.  No abnormal osseous finding.  IMPRESSION: No acute finding by plain radiography   Original Report Authenticated By: Judie Petit. Ruel Favors, M.D.      No diagnosis found.  9:45 - morphine 4 mg, zofran 4 mg IV given with no relief of pain, nausea better 10:30 dilaudid 1 mg, again with no relief of pain 11:30 repeat of dilaudid 1mg , phenergan 25 mg IV given with transient relief of sx. 13:30 trial of bentyl 20  mg IM, ativan 0.5 mg IV with no relief of pain  14:45 dilaudid 1 mg IV, zofran 4 mg IV given with   MDM  Labs reviewed,  xrays reviewed. Discussed pt with Dr Ignacia Palma who also saw pt.  Also,  Prior hospitalizations and Ct scans reviewed,  Most recently had 2 Ct scans in 4/13 and unremarkable.   After multiple doses of pain medications per above,  Patient still with significant pain,  Although nausea is some better.  Spoke with Dr. Sherrie Mustache who will evaluate this patient for possible admission for pain control.        Burgess Amor, [place] 01/11/12 1601

## 2012-01-11 NOTE — ED Notes (Signed)
Patient ambulating unit

## 2012-01-12 LAB — URINE CULTURE

## 2012-01-12 NOTE — ED Provider Notes (Signed)
Medical screening examination/treatment/procedure(s) were conducted as a shared visit with non-physician practitioner(s) and myself.  I personally evaluated the patient during the encounter Pt is a 31 year old woman who has had numerous abdominal operations, who has chronic and recurrent abdominal pain.  Exam and x-rays showed no indication for surgery, but her pain has been uncontrolled despite 3 doses of Dilaudid IV.  Recommended admission for pain control.  Carleene Cooper III, MD 01/12/12 Rickey Primus

## 2012-01-15 ENCOUNTER — Encounter (HOSPITAL_COMMUNITY): Payer: Self-pay | Admitting: *Deleted

## 2012-01-15 ENCOUNTER — Emergency Department (HOSPITAL_COMMUNITY)
Admission: EM | Admit: 2012-01-15 | Discharge: 2012-01-15 | Disposition: A | Discharge status: Home / Self Care | Payer: BC Managed Care – PPO | Attending: Emergency Medicine | Admitting: Emergency Medicine

## 2012-01-15 ENCOUNTER — Emergency Department (HOSPITAL_COMMUNITY): Payer: BC Managed Care – PPO

## 2012-01-15 DIAGNOSIS — E669 Obesity, unspecified: Secondary | ICD-10-CM | POA: Insufficient documentation

## 2012-01-15 DIAGNOSIS — Z888 Allergy status to other drugs, medicaments and biological substances status: Secondary | ICD-10-CM | POA: Insufficient documentation

## 2012-01-15 DIAGNOSIS — F329 Major depressive disorder, single episode, unspecified: Secondary | ICD-10-CM | POA: Insufficient documentation

## 2012-01-15 DIAGNOSIS — F3289 Other specified depressive episodes: Secondary | ICD-10-CM | POA: Insufficient documentation

## 2012-01-15 DIAGNOSIS — R109 Unspecified abdominal pain: Secondary | ICD-10-CM | POA: Insufficient documentation

## 2012-01-15 DIAGNOSIS — G8929 Other chronic pain: Secondary | ICD-10-CM

## 2012-01-15 LAB — URINALYSIS, ROUTINE W REFLEX MICROSCOPIC
Glucose, UA: NEGATIVE mg/dL
Nitrite: NEGATIVE
Protein, ur: NEGATIVE mg/dL
Urobilinogen, UA: 0.2 mg/dL (ref 0.0–1.0)

## 2012-01-15 LAB — CBC WITH DIFFERENTIAL/PLATELET
Basophils Absolute: 0 10*3/uL (ref 0.0–0.1)
Basophils Relative: 1 % (ref 0–1)
Eosinophils Absolute: 0.2 10*3/uL (ref 0.0–0.7)
Lymphs Abs: 2.5 10*3/uL (ref 0.7–4.0)
MCH: 30.5 pg (ref 26.0–34.0)
Neutrophils Relative %: 60 % (ref 43–77)
Platelets: 352 10*3/uL (ref 150–400)
RBC: 4.85 MIL/uL (ref 3.87–5.11)

## 2012-01-15 LAB — BASIC METABOLIC PANEL
Calcium: 9.9 mg/dL (ref 8.4–10.5)
GFR calc non Af Amer: >90 mL/min (ref >90)
Glucose, Bld: 113 mg/dL — ABNORMAL HIGH (ref 70–99)
Sodium: 137 mEq/L (ref 135–145)

## 2012-01-15 LAB — PREGNANCY, URINE: Preg Test, Ur: NEGATIVE

## 2012-01-15 MED ORDER — PANTOPRAZOLE SODIUM 40 MG IV SOLR
40.0000 mg | Freq: Once | INTRAVENOUS | Status: AC
  Administered 2012-01-15: 40 mg via INTRAVENOUS
  Filled 2012-01-15: qty 40

## 2012-01-15 MED ORDER — HYDROMORPHONE HCL PF 2 MG/ML IJ SOLN
2.0000 mg | Freq: Once | INTRAMUSCULAR | Status: AC
  Administered 2012-01-15: 2 mg via INTRAVENOUS
  Filled 2012-01-15: qty 1

## 2012-01-15 MED ORDER — ONDANSETRON HCL 4 MG/2ML IJ SOLN
8.0000 mg | Freq: Once | INTRAMUSCULAR | Status: AC
  Administered 2012-01-15: 8 mg via INTRAVENOUS
  Filled 2012-01-15: qty 4

## 2012-01-15 MED ORDER — SODIUM CHLORIDE 0.9 % IV BOLUS (SEPSIS)
1000.0000 mL | Freq: Once | INTRAVENOUS | Status: AC
  Administered 2012-01-15: 1000 mL via INTRAVENOUS

## 2012-01-15 MED ORDER — PROMETHAZINE HCL 25 MG/ML IJ SOLN
25.0000 mg | Freq: Once | INTRAMUSCULAR | Status: AC
  Administered 2012-01-15: 25 mg via INTRAVENOUS
  Filled 2012-01-15: qty 1

## 2012-01-15 NOTE — ED Provider Notes (Signed)
History     CSN: [id number]  Arrival date & time 01/15/12  1102   First MD Initiated Contact with Patient 01/15/12 1518      Chief Complaint  Patient presents with  . Abdominal Pain    (Consider location/radiation/quality/duration/timing/severity/associated sxs/prior treatment) HPI Pt with history of chronic R lower abdominal pain attributed to adhesions from multiple prior surgeries has had about 5 days of moderate to severe sharp, "pulling" pain in RLQ, associated with episodes of vomiting non bilious non bloody emesis but no change in bowels. She was seen for same 4 days ago and had neg workup then. States she was feeling well for one day and then symptoms returned.  Past Medical History  Diagnosis Date  . Migraines   . Pneumonia     04/2010  . Eye infection     within last 2 weeks  . Depression   . Complication of anesthesia     Difficult IV stick with Central line placement  . Intestinal obstruction   . Gastritis 03/2011    Per EGD  . Obesity   . Ovarian cyst, right     Hx of oophorectomy for adhesions  . Drug-seeking behavior   . Renal disorder     Kidney Stones    Past Surgical History  Procedure Date  . Tonsillectomy 1991  . Appendectomy 1992  . Cholecystectomy 2003  . Right ovary removed 2006  . Abdominal adhesion surgery 2006  . Knee arthroscopy 2001, 2010    left knee x 2  . Hernia repair     umbilical  . Nasal septoplasty w/ turbinoplasty 03/16/2011    Procedure: NASAL SEPTOPLASTY WITH TURBINATE REDUCTION;  Surgeon: Susy Frizzle, MD;  Location: [facility];  Service: ENT;  Laterality: Bilateral;  . Laparoscopic gastric banding   . Removal of lap band   . Pilonidal cyst excision   . Esophagogastroduodenoscopy 04/05/2011    Procedure: ESOPHAGOGASTRODUODENOSCOPY (EGD);  Surgeon: Charna Elizabeth, MD;  Location: [facility];  Service: Endoscopy;  Laterality: N/A;    History reviewed. No pertinent family history.  History  Substance Use Topics  . Smoking  status: Never Smoker   . Smokeless tobacco: Never Used  . Alcohol Use: Yes     rarely    OB History    Grav Para Term Preterm Abortions TAB SAB Ect Mult Living                  Review of Systems All other systems reviewed and are negative except as noted in HPI.   Allergies  Toradol; Compazine; Haloperidol and related; and Reglan  Home Medications   Current Outpatient Rx  Name Route Sig Dispense Refill  . DULOXETINE HCL 60 MG PO CPEP Oral Take 60 mg by mouth daily.      . IBUPROFEN 200 MG PO TABS Oral Take 400 mg by mouth every 6 (six) hours as needed. Headache.    Marland Kitchen LEVONORGESTREL 20 MCG/24HR IU IUD Intrauterine 1 each by Intrauterine route once.    . OXYCODONE HCL 5 MG PO TABS Oral Take 1 tablet (5 mg total) by mouth every 4 (four) hours as needed for pain. 10 tablet 0  . PANTOPRAZOLE SODIUM 40 MG PO TBEC Oral Take 1 tablet (40 mg total) by mouth daily. 10 tablet 0  . POLYETHYLENE GLYCOL 3350 PO PACK Oral Take 17 g by mouth daily. 14 each 0  . PROMETHAZINE HCL 25 MG PO TABS Oral Take 1 tablet (25 mg total) by  mouth every 6 (six) hours as needed for nausea. 10 tablet 0  . PROPRANOLOL HCL 40 MG PO TABS Oral Take 40 mg by mouth daily.    . QUETIAPINE FUMARATE 25 MG PO TABS Oral Take 25 mg by mouth at bedtime as needed. For sleep.      BP 95/65  Pulse 94  Temp 98.4 F (36.9 C) (Oral)  Resp 18  Ht 5\' 2"  (1.575 m)  Wt 230 lb (104.327 kg)  BMI 42.07 kg/m2  SpO2 100%  Physical Exam  Nursing note and vitals reviewed. Constitutional: She is oriented to person, place, and time. She appears well-developed and well-nourished.  HENT:  Head: Normocephalic and atraumatic.  Eyes: EOM are normal. Pupils are equal, round, and reactive to light.  Neck: Normal range of motion. Neck supple.  Cardiovascular: Normal rate, normal heart sounds and intact distal pulses.   Pulmonary/Chest: Effort normal and breath sounds normal.  Abdominal: Soft. Bowel sounds are normal. She exhibits no  distension. There is tenderness (RLQ). There is no rebound and no guarding.  Musculoskeletal: Normal range of motion. She exhibits no edema and no tenderness.  Neurological: She is alert and oriented to person, place, and time. She has normal strength. No cranial nerve deficit or sensory deficit.  Skin: Skin is warm and dry. No rash noted.  Psychiatric: She has a normal mood and affect.    ED Course  Procedures (including critical care time)  Labs Reviewed  BASIC METABOLIC PANEL - Abnormal; Notable for the following:    Glucose, Bld 113 (*)     All other components within normal limits  URINALYSIS, ROUTINE W REFLEX MICROSCOPIC - Abnormal; Notable for the following:    Hgb urine dipstick TRACE (*)     Leukocytes, UA TRACE (*)     All other components within normal limits  URINE MICROSCOPIC-ADD ON - Abnormal; Notable for the following:    Squamous Epithelial / LPF FEW (*)     Bacteria, UA FEW (*)     All other components within normal limits  CBC WITH DIFFERENTIAL  PREGNANCY, URINE   No results found.   No diagnosis found.    MDM  Labs unchanged, will repeat AAS to rule out SBO. Has had numerous prior CT which were neg. Given pain and nausea meds.   5:21 PM Pt states vomited in room but no witnessed by ED staff. Per prior visits and admission, there has been reports of patient pouring water and CT contrast in emesis bag to simulate vomiting results. Pt given [place] in ED.       Olayinka Gathers B. Bernette Mayers, MD 01/15/12 2153

## 2012-01-15 NOTE — ED Notes (Signed)
Pt c/o of flank pain 8/10 that started last week. Pt has a hx. Of kidney stones and states "this doesn't feel like that". Pt states her physician told her she had pelvic scar tissue and may need surgery at the [facility] pelvic pain clinic.

## 2012-01-15 NOTE — ED Notes (Signed)
Pain rt flank and abd x 5 days   N/v, D,  Vomited in lobby.  Seen here Thursday.  For  Same.Marland Kitchen

## 2012-01-15 NOTE — ED Notes (Signed)
Patient transported to X-ray 

## 2012-05-01 IMAGING — CT CT ABD-PELV W/O CM
1 of 2 series · 15 of 32 positions shown, 19 images · non-contrast
Comparison: 04/03/2011

CLINICAL DATA: Right upper abdominal pain.

CT ABDOMEN AND PELVIS WITHOUT CONTRAST
TECHNIQUE: Multidetector CT imaging of the abdomen and pelvis was
performed following the standard protocol without intravenous
contrast.

[Series 2: abd/pel w/o · axial · non-contrast · 0.90mm/px · z∈[+872,+1322]mm · 15 of 100 slices shown, 19 images]
[im 5/100  soft-tissue]
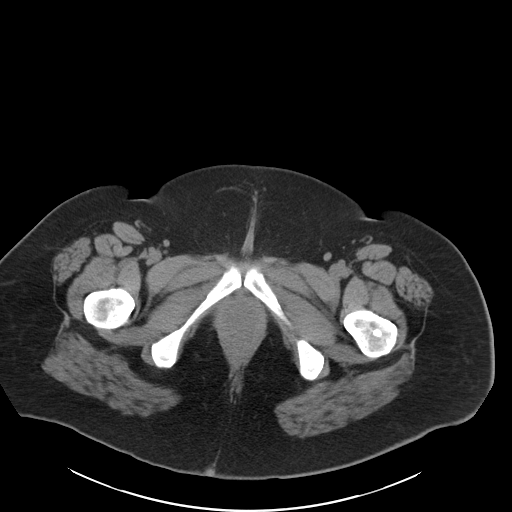
[im 5/100  bone]
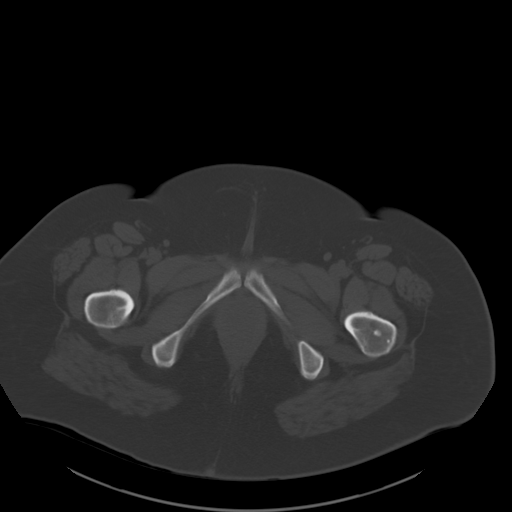
[im 13/100  soft-tissue]
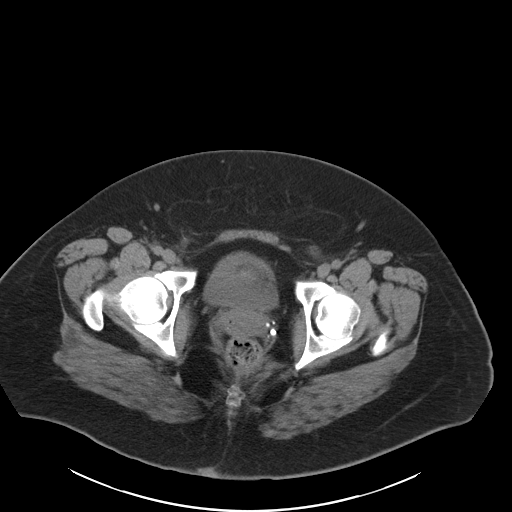
[im 21/100  soft-tissue]
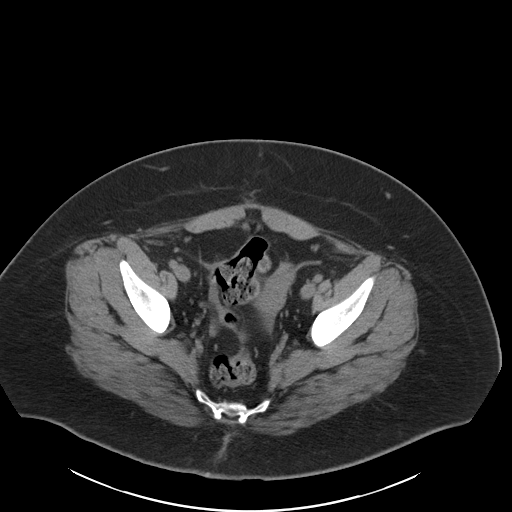
[im 29/100  soft-tissue]
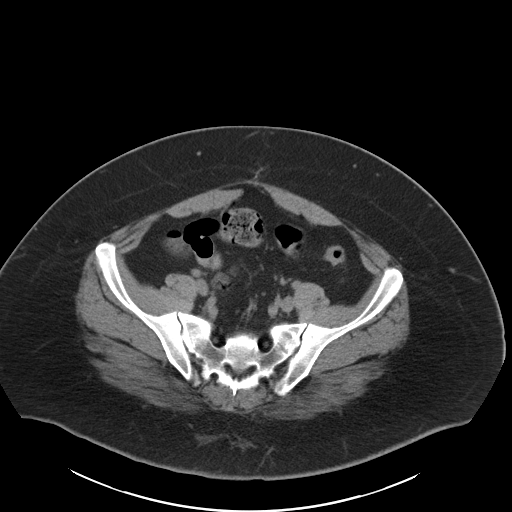
[im 34/100  soft-tissue]
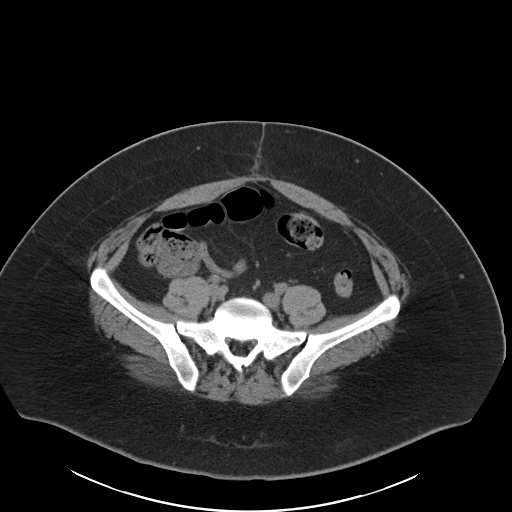
[im 42/100  soft-tissue]
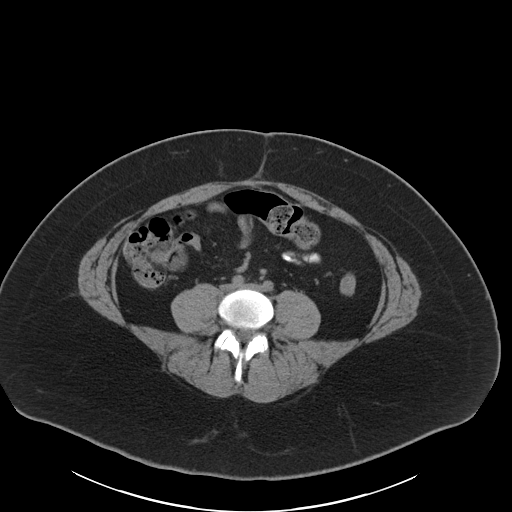
[im 50/100  soft-tissue]
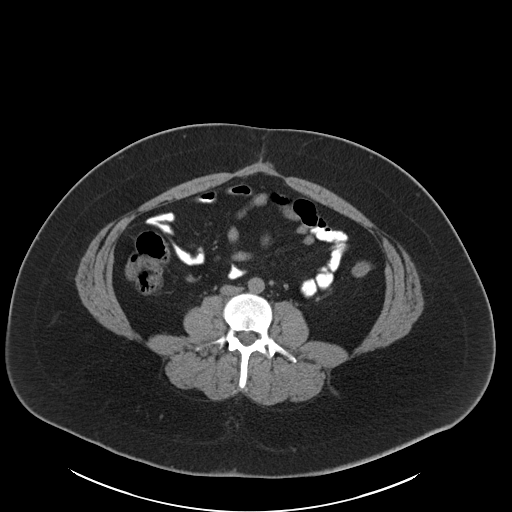
[im 58/100  soft-tissue]
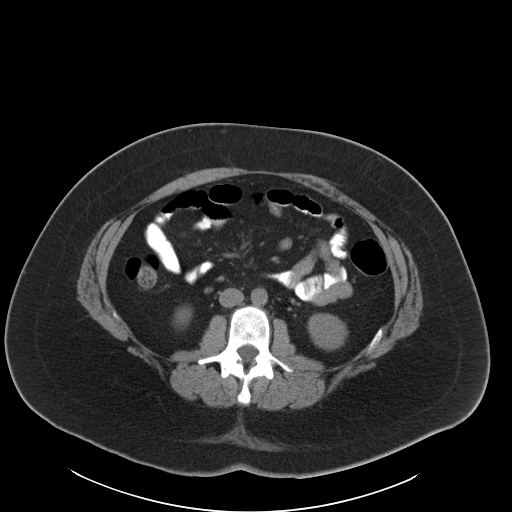
[im 67/100  soft-tissue]
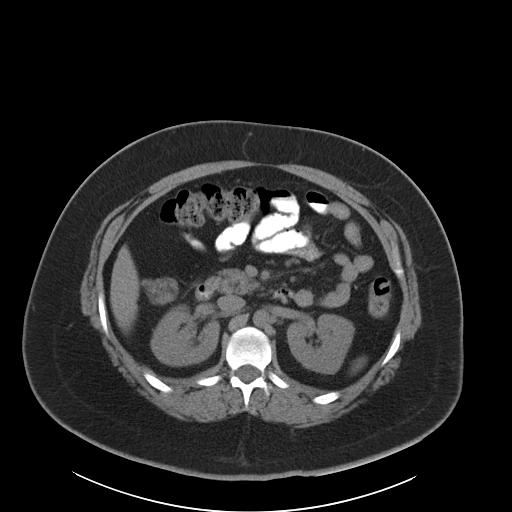
[im 67/100  bone]
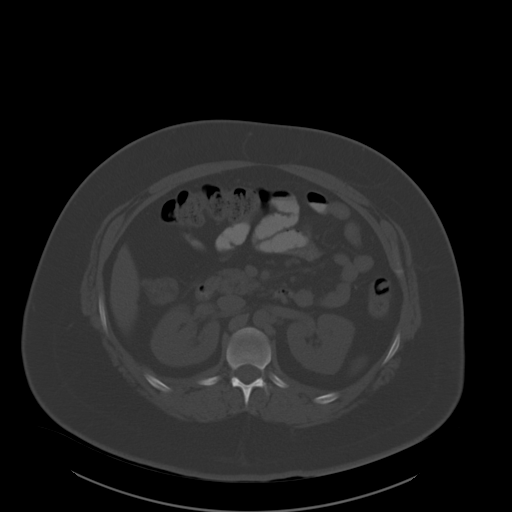
[im 71/100  soft-tissue]
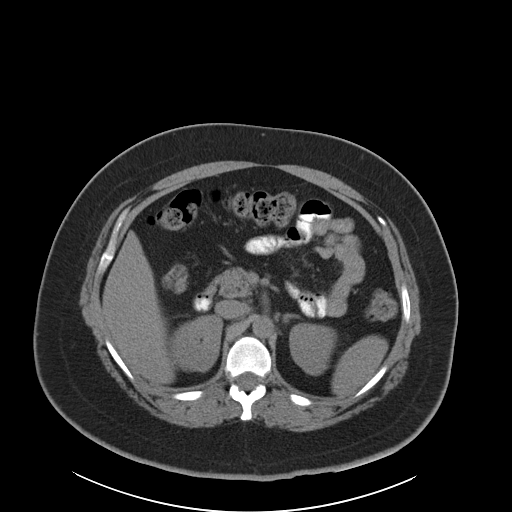
[im 79/100  soft-tissue]
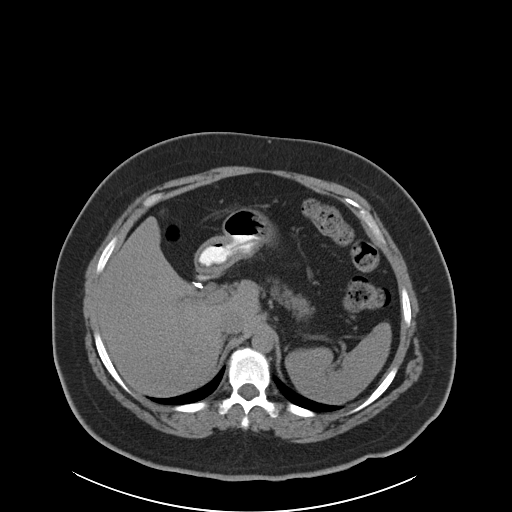
[im 83/100  lung]
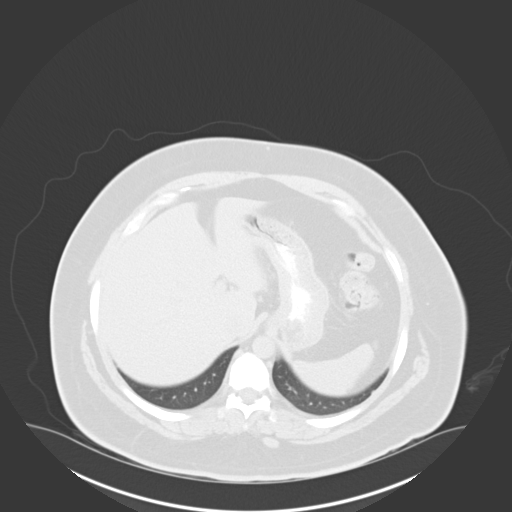
[im 87/100  soft-tissue]
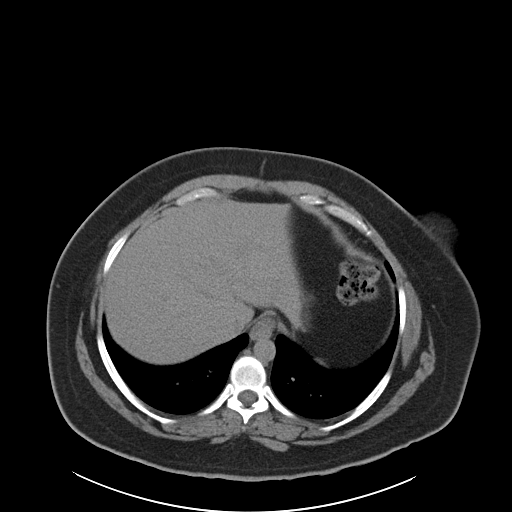
[im 87/100  lung]
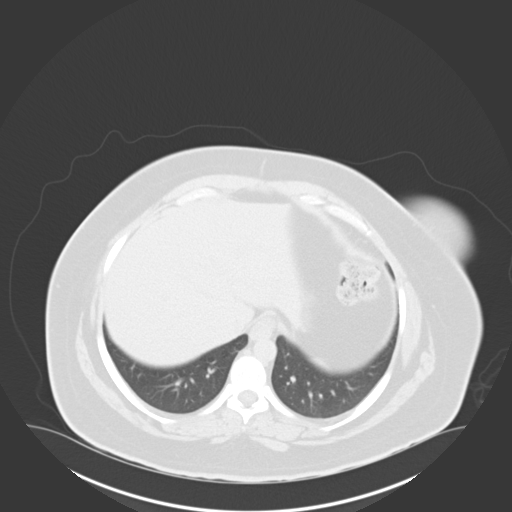
[im 91/100  lung]
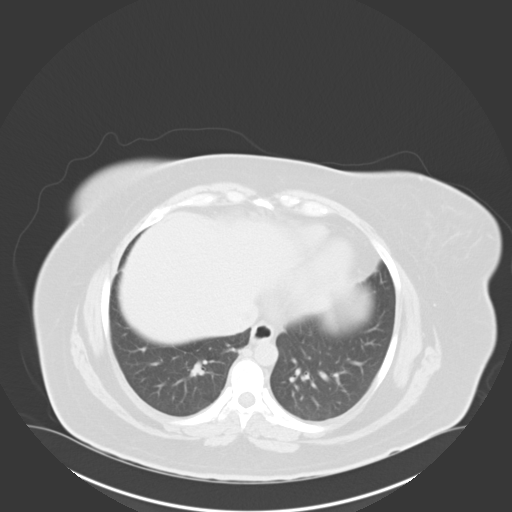
[im 95/100  soft-tissue]
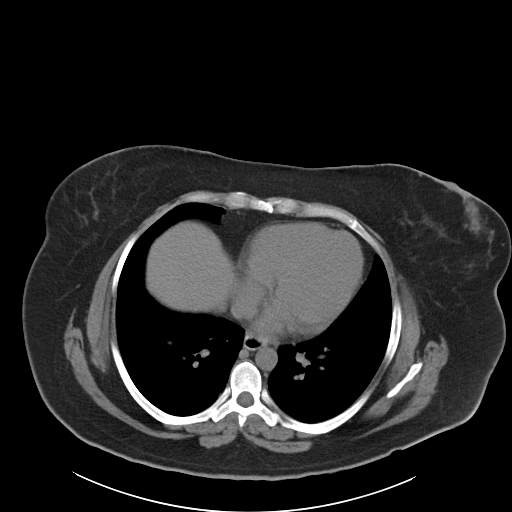
[im 95/100  lung]
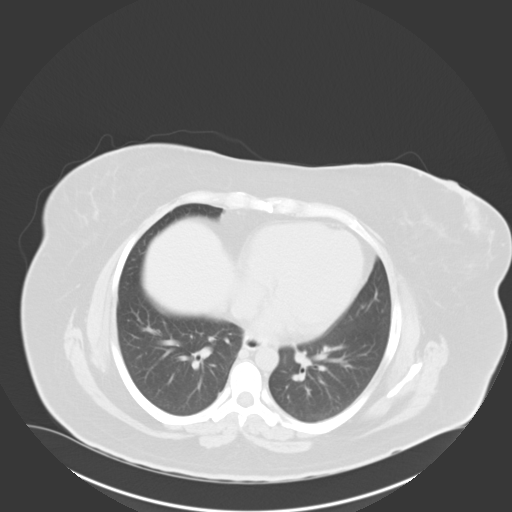

[15 of 32 positions shown; findings below may reference images not displayed]

FINDINGS: The visualized portion of the liver, spleen, pancreas,
and adrenal glands appear unremarkable in noncontrast CT
appearance.

The gallbladder is surgically absent.

There is a 2 mm right mid to upper kidney nonobstructive calculus.
No ureteral calculus or bladder calculus is observed.  The kidneys
appear otherwise unremarkable noncontrast CT appearance.

No pathologic retroperitoneal or porta hepatis adenopathy is
identified.

Stable catheter fragment, foreign body, or scarring noted in the
omentum.

Scattered small mesenteric lymph nodes are not pathologically
enlarged.

No dilated bowel noted.  The appendix is absent.

A T-shaped IUD is satisfactorily positioned in the uterus.  A 3.0 x
2.3 cm right adnexal cyst previously measured 2.8 x 2.1 cm. No
right ovarian parenchymal tissue is observed.
IMPRESSION: 1.  2 mm right mid kidney upper pole nonobstructive calculus.
2.  Stable linear opacity potentially representing scarring or
catheter fragment along the omentum.
3.  Stable small right adnexal cyst.

## 2012-05-28 ENCOUNTER — Emergency Department (HOSPITAL_COMMUNITY): Payer: BC Managed Care – PPO

## 2012-05-28 ENCOUNTER — Encounter (HOSPITAL_COMMUNITY): Payer: Self-pay | Admitting: Emergency Medicine

## 2012-05-28 ENCOUNTER — Emergency Department (HOSPITAL_COMMUNITY)
Admission: EM | Admit: 2012-05-28 | Discharge: 2012-05-28 | Disposition: A | Discharge status: Home / Self Care | Payer: BC Managed Care – PPO | Attending: Emergency Medicine | Admitting: Emergency Medicine

## 2012-05-28 DIAGNOSIS — Z87440 Personal history of urinary (tract) infections: Secondary | ICD-10-CM | POA: Insufficient documentation

## 2012-05-28 DIAGNOSIS — R112 Nausea with vomiting, unspecified: Secondary | ICD-10-CM | POA: Insufficient documentation

## 2012-05-28 DIAGNOSIS — E669 Obesity, unspecified: Secondary | ICD-10-CM | POA: Insufficient documentation

## 2012-05-28 DIAGNOSIS — Z9889 Other specified postprocedural states: Secondary | ICD-10-CM | POA: Insufficient documentation

## 2012-05-28 DIAGNOSIS — Z8701 Personal history of pneumonia (recurrent): Secondary | ICD-10-CM | POA: Insufficient documentation

## 2012-05-28 DIAGNOSIS — F329 Major depressive disorder, single episode, unspecified: Secondary | ICD-10-CM | POA: Insufficient documentation

## 2012-05-28 DIAGNOSIS — R197 Diarrhea, unspecified: Secondary | ICD-10-CM | POA: Insufficient documentation

## 2012-05-28 DIAGNOSIS — Z3202 Encounter for pregnancy test, result negative: Secondary | ICD-10-CM | POA: Insufficient documentation

## 2012-05-28 DIAGNOSIS — F3289 Other specified depressive episodes: Secondary | ICD-10-CM | POA: Insufficient documentation

## 2012-05-28 DIAGNOSIS — G43909 Migraine, unspecified, not intractable, without status migrainosus: Secondary | ICD-10-CM | POA: Insufficient documentation

## 2012-05-28 DIAGNOSIS — Z8719 Personal history of other diseases of the digestive system: Secondary | ICD-10-CM | POA: Insufficient documentation

## 2012-05-28 DIAGNOSIS — Z8742 Personal history of other diseases of the female genital tract: Secondary | ICD-10-CM | POA: Insufficient documentation

## 2012-05-28 DIAGNOSIS — Z79899 Other long term (current) drug therapy: Secondary | ICD-10-CM | POA: Insufficient documentation

## 2012-05-28 DIAGNOSIS — K2970 Gastritis, unspecified, without bleeding: Secondary | ICD-10-CM | POA: Insufficient documentation

## 2012-05-28 DIAGNOSIS — R109 Unspecified abdominal pain: Secondary | ICD-10-CM | POA: Insufficient documentation

## 2012-05-28 LAB — CBC WITH DIFFERENTIAL/PLATELET
Basophils Relative: 1 % (ref 0–1)
Eosinophils Absolute: 0.2 10*3/uL (ref 0.0–0.7)
HCT: 39.2 % (ref 36.0–46.0)
Hemoglobin: 13.7 g/dL (ref 12.0–15.0)
Lymphs Abs: 2.3 10*3/uL (ref 0.7–4.0)
MCH: 30.7 pg (ref 26.0–34.0)
MCHC: 34.9 g/dL (ref 30.0–36.0)
MCV: 87.9 fL (ref 78.0–100.0)
Monocytes Absolute: 0.5 10*3/uL (ref 0.1–1.0)
Monocytes Relative: 6 % (ref 3–12)

## 2012-05-28 LAB — URINALYSIS, MICROSCOPIC ONLY
Glucose, UA: NEGATIVE mg/dL
Ketones, ur: NEGATIVE mg/dL
Nitrite: NEGATIVE
Protein, ur: NEGATIVE mg/dL
pH: 5.5 (ref 5.0–8.0)

## 2012-05-28 LAB — COMPREHENSIVE METABOLIC PANEL
Albumin: 3.7 g/dL (ref 3.5–5.2)
BUN: 14 mg/dL (ref 6–23)
Creatinine, Ser: 0.57 mg/dL (ref 0.50–1.10)
GFR calc Af Amer: >90 mL/min (ref >90)
Glucose, Bld: 149 mg/dL — ABNORMAL HIGH (ref 70–99)
Total Bilirubin: 0.4 mg/dL (ref 0.3–1.2)
Total Protein: 7.1 g/dL (ref 6.0–8.3)

## 2012-05-28 MED ORDER — HYDROMORPHONE HCL PF 1 MG/ML IJ SOLN
0.5000 mg | Freq: Once | INTRAMUSCULAR | Status: AC
  Administered 2012-05-28: 0.5 mg via INTRAVENOUS
  Filled 2012-05-28: qty 1

## 2012-05-28 MED ORDER — PROMETHAZINE HCL 25 MG/ML IJ SOLN
25.0000 mg | Freq: Once | INTRAMUSCULAR | Status: AC
  Administered 2012-05-28: 25 mg via INTRAVENOUS
  Filled 2012-05-28: qty 1

## 2012-05-28 MED ORDER — DIPHENHYDRAMINE HCL 50 MG/ML IJ SOLN
25.0000 mg | Freq: Once | INTRAMUSCULAR | Status: AC
  Administered 2012-05-28: 25 mg via INTRAVENOUS
  Filled 2012-05-28: qty 1

## 2012-05-28 MED ORDER — DIPHENHYDRAMINE HCL 25 MG PO CAPS
25.0000 mg | ORAL_CAPSULE | Freq: Once | ORAL | Status: DC
  Filled 2012-05-28: qty 1

## 2012-05-28 MED ORDER — SODIUM CHLORIDE 0.9 % IV SOLN
Freq: Once | INTRAVENOUS | Status: AC
  Administered 2012-05-28: 10 mL/h via INTRAVENOUS

## 2012-05-28 MED ORDER — HYDROMORPHONE HCL PF 1 MG/ML IJ SOLN
1.0000 mg | Freq: Once | INTRAMUSCULAR | Status: AC
  Administered 2012-05-28: 1 mg via INTRAVENOUS
  Filled 2012-05-28: qty 1

## 2012-05-28 MED ORDER — HYDROMORPHONE HCL PF 1 MG/ML IJ SOLN: 1.0000 mg | Freq: Once | INTRAMUSCULAR | Status: DC

## 2012-05-28 MED ORDER — HYDROCODONE-ACETAMINOPHEN 5-325 MG PO TABS: 1.0000 | ORAL_TABLET | Freq: Three times a day (TID) | ORAL | Status: DC | PRN

## 2012-05-28 MED ORDER — ONDANSETRON HCL 4 MG/2ML IJ SOLN: 4.0000 mg | Freq: Once | INTRAMUSCULAR | Status: DC

## 2012-05-28 NOTE — ED Notes (Signed)
Pt. Is unable to use the restroom at this time, but she is aware that we need a urine specimen.  

## 2012-05-28 NOTE — ED Notes (Signed)
States that she started having diarrhea about 4 days ago. States that she has been vomiting for the past 2 days. States that she has a hx of bowel obstructions and is scheduled to have abd sx on Feb 12 at [facility] to remove adhesions on her colon.

## 2012-05-28 NOTE — ED Notes (Signed)
Patient transported to X-ray 

## 2012-05-28 NOTE — ED Notes (Signed)
Pt. Tried to use the restroom, but was unable to go at this time.

## 2012-05-28 NOTE — ED Provider Notes (Signed)
History     CSN: [id number]  Arrival date & time 05/28/12  4098   First MD Initiated Contact with Patient 05/28/12 [phone]      Chief Complaint  Patient presents with  . Emesis  . Abdominal Pain  . Diarrhea    (Consider location/radiation/quality/duration/timing/severity/associated sxs/prior treatment) HPI Patient presents with abdominal pain.  She states that this episode began approximately 4 days ago without any clear precipitant.  Since onset the pain has been persistent.  The pain is focally about the right upper quadrant, epigastrium.  The pain is sharp, crampy.  There is associated nausea, vomiting, diarrhea. She states that the pain is very similar to innumerable episodes of pain she has had previously.  Pain is not improved by anything, nor clearly exacerbated by anything. No lightheadedness, no dyspnea, no chest pain. The patient has a history of adhesive disease, and is scheduled for a lysis of adhesion in one month. She has a history of prior appendectomy, prior cholecystectomy, prior right oophorectomy  Past Medical History  Diagnosis Date  . Migraines   . Pneumonia     04/2010  . Eye infection     within last 2 weeks  . Depression   . Complication of anesthesia     Difficult IV stick with Central line placement  . Intestinal obstruction   . Gastritis 03/2011    Per EGD  . Obesity   . Ovarian cyst, right     Hx of oophorectomy for adhesions  . Drug-seeking behavior   . Renal disorder     Kidney Stones    Past Surgical History  Procedure Date  . Tonsillectomy 1991  . Appendectomy 1992  . Cholecystectomy 2003  . Right ovary removed 2006  . Abdominal adhesion surgery 2006  . Knee arthroscopy 2001, 2010    left knee x 2  . Hernia repair     umbilical  . Nasal septoplasty w/ turbinoplasty 03/16/2011    Procedure: NASAL SEPTOPLASTY WITH TURBINATE REDUCTION;  Surgeon: Susy Frizzle, MD;  Location: [facility] OR;  Service: ENT;  Laterality: Bilateral;  .  Laparoscopic gastric banding   . Removal of lap band   . Pilonidal cyst excision   . Esophagogastroduodenoscopy 04/05/2011    Procedure: ESOPHAGOGASTRODUODENOSCOPY (EGD);  Surgeon: Charna Elizabeth, MD;  Location: [facility];  Service: Endoscopy;  Laterality: N/A;    No family history on file.  History  Substance Use Topics  . Smoking status: Never Smoker   . Smokeless tobacco: Never Used  . Alcohol Use: Yes     Comment: rarely    OB History    Grav Para Term Preterm Abortions TAB SAB Ect Mult Living                  Review of Systems  Constitutional:       Per HPI, otherwise negative  HENT:       Per HPI, otherwise negative  Eyes: Negative.   Respiratory:       Per HPI, otherwise negative  Cardiovascular:       Per HPI, otherwise negative  Gastrointestinal: Positive for nausea, vomiting, abdominal pain and diarrhea.  Genitourinary: Negative.   Musculoskeletal:       Per HPI, otherwise negative  Skin: Negative.   Neurological: Negative for syncope.    Allergies  Toradol; Compazine; Haloperidol and related; and Reglan  Home Medications   Current Outpatient Rx  Name  Route  Sig  Dispense  Refill  . DULOXETINE HCL  60 MG PO CPEP   Oral   Take 60 mg by mouth daily.           . IBUPROFEN 200 MG PO TABS   Oral   Take 400 mg by mouth every 6 (six) hours as needed. Headache.         Marland Kitchen LEVONORGESTREL 20 MCG/24HR IU IUD   Intrauterine   1 each by Intrauterine route once.         Marland Kitchen PANTOPRAZOLE SODIUM 40 MG PO TBEC   Oral   Take 1 tablet (40 mg total) by mouth daily.   10 tablet   0   . PROPRANOLOL HCL 40 MG PO TABS   Oral   Take 40 mg by mouth daily.         . QUETIAPINE FUMARATE 25 MG PO TABS   Oral   Take 25 mg by mouth at bedtime as needed. For migraines           BP 97/48  Pulse 85  Temp 98.9 F (37.2 C) (Oral)  Resp 19  SpO2 100%  Physical Exam  Nursing note and vitals reviewed. Constitutional: She is oriented to person, place, and  time. She appears well-developed and well-nourished. No distress.       Uncomfortable appearing obese young F  HENT:  Head: Normocephalic and atraumatic.  Eyes: Conjunctivae normal and EOM are normal.  Cardiovascular: Normal rate and regular rhythm.   Pulmonary/Chest: Effort normal and breath sounds normal. No stridor. No respiratory distress.  Abdominal: She exhibits no distension. There is no hepatosplenomegaly. There is tenderness in the right upper quadrant, epigastric area and periumbilical area. There is guarding. There is no rigidity and no rebound.  Musculoskeletal: She exhibits no edema.  Neurological: She is alert and oriented to person, place, and time. No cranial nerve deficit.  Skin: Skin is warm and dry.  Psychiatric: She has a normal mood and affect.    ED Course  Procedures (including critical care time)   Labs Reviewed  CBC WITH DIFFERENTIAL  COMPREHENSIVE METABOLIC PANEL  URINALYSIS, MICROSCOPIC ONLY   No results found.   No diagnosis found.  O2- 100%ra, nml  12:15 PM Patient still in pain w nausea  1:12 PM Patient appears better.  She continues to c/o abd pain. - VS remain stable.  MDM  This patient with a history of adhesive disease, chronic abdominal pain now presents with acute exacerbation.  On exam the patient is hemodynamically stable, and after several rounds of analgesics, fluids, she improved substantially.  The patient's abdomen was soft, with little suspicion of peritonitis.  Absent fever, leukocytosis, and with the patient's capacity to follow up with her primary care team, she is appropriate for discharge with outpatient followup.  Gerhard Munch, MD 05/28/12 1313

## 2012-05-29 LAB — URINE CULTURE: Colony Count: 8000

## 2013-01-20 ENCOUNTER — Emergency Department (HOSPITAL_COMMUNITY): Payer: BC Managed Care – PPO

## 2013-01-20 ENCOUNTER — Encounter (HOSPITAL_COMMUNITY): Payer: Self-pay | Admitting: *Deleted

## 2013-01-20 ENCOUNTER — Observation Stay (HOSPITAL_COMMUNITY)
Admission: EM | Admit: 2013-01-20 | Discharge: 2013-01-21 | Disposition: A | Discharge status: Home / Self Care | Payer: BC Managed Care – PPO | Attending: Internal Medicine | Admitting: Internal Medicine

## 2013-01-20 DIAGNOSIS — M6281 Muscle weakness (generalized): Secondary | ICD-10-CM

## 2013-01-20 DIAGNOSIS — G43909 Migraine, unspecified, not intractable, without status migrainosus: Principal | ICD-10-CM | POA: Diagnosis present

## 2013-01-20 DIAGNOSIS — R109 Unspecified abdominal pain: Secondary | ICD-10-CM | POA: Diagnosis present

## 2013-01-20 DIAGNOSIS — R531 Weakness: Secondary | ICD-10-CM | POA: Diagnosis present

## 2013-01-20 DIAGNOSIS — R209 Unspecified disturbances of skin sensation: Secondary | ICD-10-CM | POA: Insufficient documentation

## 2013-01-20 DIAGNOSIS — H532 Diplopia: Secondary | ICD-10-CM | POA: Diagnosis present

## 2013-01-20 DIAGNOSIS — Z9089 Acquired absence of other organs: Secondary | ICD-10-CM | POA: Insufficient documentation

## 2013-01-20 DIAGNOSIS — H539 Unspecified visual disturbance: Secondary | ICD-10-CM

## 2013-01-20 DIAGNOSIS — N390 Urinary tract infection, site not specified: Secondary | ICD-10-CM

## 2013-01-20 DIAGNOSIS — R112 Nausea with vomiting, unspecified: Secondary | ICD-10-CM | POA: Diagnosis present

## 2013-01-20 DIAGNOSIS — R29898 Other symptoms and signs involving the musculoskeletal system: Secondary | ICD-10-CM | POA: Insufficient documentation

## 2013-01-20 DIAGNOSIS — F411 Generalized anxiety disorder: Secondary | ICD-10-CM | POA: Insufficient documentation

## 2013-01-20 DIAGNOSIS — N12 Tubulo-interstitial nephritis, not specified as acute or chronic: Secondary | ICD-10-CM

## 2013-01-20 DIAGNOSIS — R5381 Other malaise: Secondary | ICD-10-CM

## 2013-01-20 LAB — URINALYSIS, ROUTINE W REFLEX MICROSCOPIC
Glucose, UA: NEGATIVE mg/dL
Ketones, ur: 15 mg/dL — AB
Protein, ur: 30 mg/dL — AB
Urobilinogen, UA: 0.2 mg/dL (ref 0.0–1.0)

## 2013-01-20 LAB — BASIC METABOLIC PANEL
BUN: 13 mg/dL (ref 6–23)
Calcium: 9.9 mg/dL (ref 8.4–10.5)
Creatinine, Ser: 0.6 mg/dL (ref 0.50–1.10)
GFR calc Af Amer: >90 mL/min (ref >90)
GFR calc non Af Amer: >90 mL/min (ref >90)
Glucose, Bld: 129 mg/dL — ABNORMAL HIGH (ref 70–99)

## 2013-01-20 LAB — CBC
HCT: 42.4 % (ref 36.0–46.0)
Hemoglobin: 15.3 g/dL — ABNORMAL HIGH (ref 12.0–15.0)
MCH: 31.5 pg (ref 26.0–34.0)
MCHC: 36.1 g/dL — ABNORMAL HIGH (ref 30.0–36.0)
MCV: 87.2 fL (ref 78.0–100.0)
RDW: 12.9 % (ref 11.5–15.5)

## 2013-01-20 LAB — HEPATIC FUNCTION PANEL
AST: 16 U/L (ref 0–37)
Bilirubin, Direct: <0.1 mg/dL (ref 0.0–0.3)
Total Bilirubin: 0.3 mg/dL (ref 0.3–1.2)

## 2013-01-20 LAB — PREGNANCY, URINE: Preg Test, Ur: NEGATIVE

## 2013-01-20 LAB — URINE MICROSCOPIC-ADD ON

## 2013-01-20 MED ORDER — PROMETHAZINE HCL 25 MG/ML IJ SOLN
25.0000 mg | Freq: Once | INTRAMUSCULAR | Status: AC
  Administered 2013-01-20: 25 mg via INTRAVENOUS
  Filled 2013-01-20: qty 1

## 2013-01-20 MED ORDER — DEXTROSE 5 % IV SOLN
1.0000 g | Freq: Once | INTRAVENOUS | Status: AC
  Administered 2013-01-20: 1 g via INTRAVENOUS
  Filled 2013-01-20: qty 10

## 2013-01-20 MED ORDER — MIDAZOLAM HCL 2 MG/2ML IJ SOLN
2.0000 mg | Freq: Once | INTRAMUSCULAR | Status: AC
  Administered 2013-01-20: 2 mg via INTRAVENOUS
  Filled 2013-01-20: qty 2

## 2013-01-20 MED ORDER — HYDROMORPHONE HCL PF 1 MG/ML IJ SOLN
1.0000 mg | Freq: Once | INTRAMUSCULAR | Status: AC
  Administered 2013-01-20: 1 mg via INTRAVENOUS
  Filled 2013-01-20: qty 1

## 2013-01-20 MED ORDER — ONDANSETRON HCL 4 MG/2ML IJ SOLN
4.0000 mg | Freq: Once | INTRAMUSCULAR | Status: AC
  Administered 2013-01-20: 4 mg via INTRAVENOUS
  Filled 2013-01-20: qty 2

## 2013-01-20 MED ORDER — DULOXETINE HCL 60 MG PO CPEP
60.0000 mg | ORAL_CAPSULE | Freq: Every day | ORAL | Status: DC
  Administered 2013-01-21: 60 mg via ORAL
  Filled 2013-01-20: qty 1

## 2013-01-20 MED ORDER — SODIUM CHLORIDE 0.9 % IV SOLN
INTRAVENOUS | Status: DC
  Administered 2013-01-20 – 2013-01-21 (×2): via INTRAVENOUS

## 2013-01-20 MED ORDER — DIPHENHYDRAMINE HCL 50 MG/ML IJ SOLN
25.0000 mg | Freq: Once | INTRAMUSCULAR | Status: AC
  Administered 2013-01-20: 25 mg via INTRAVENOUS
  Filled 2013-01-20: qty 1

## 2013-01-20 MED ORDER — DEXTROSE 5 % IV SOLN
1.0000 g | INTRAVENOUS | Status: DC
  Filled 2013-01-20: qty 10

## 2013-01-20 MED ORDER — SODIUM CHLORIDE 0.9 % IV SOLN: INTRAVENOUS | Status: DC

## 2013-01-20 MED ORDER — HYDROMORPHONE HCL PF 1 MG/ML IJ SOLN
1.0000 mg | INTRAMUSCULAR | Status: DC | PRN
  Administered 2013-01-20 – 2013-01-21 (×3): 1 mg via INTRAVENOUS
  Filled 2013-01-20 (×3): qty 1

## 2013-01-20 MED ORDER — ENOXAPARIN SODIUM 40 MG/0.4ML ~~LOC~~ SOLN
40.0000 mg | SUBCUTANEOUS | Status: DC
  Administered 2013-01-20: 40 mg via SUBCUTANEOUS
  Filled 2013-01-20 (×2): qty 0.4

## 2013-01-20 MED ORDER — DIPHENHYDRAMINE HCL 25 MG PO CAPS
25.0000 mg | ORAL_CAPSULE | Freq: Four times a day (QID) | ORAL | Status: DC | PRN
Start: 2013-01-20 — End: 2013-01-21
  Administered 2013-01-21: 25 mg via ORAL
  Filled 2013-01-20 (×2): qty 1

## 2013-01-20 MED ORDER — LORAZEPAM 2 MG/ML IJ SOLN
1.0000 mg | Freq: Once | INTRAMUSCULAR | Status: AC
  Administered 2013-01-20: 1 mg via INTRAVENOUS
  Filled 2013-01-20: qty 1

## 2013-01-20 MED ORDER — SODIUM CHLORIDE 0.9 % IV BOLUS (SEPSIS)
1000.0000 mL | Freq: Once | INTRAVENOUS | Status: AC
  Administered 2013-01-20: 16:00:00 via INTRAVENOUS

## 2013-01-20 MED ORDER — PROMETHAZINE HCL 25 MG/ML IJ SOLN
12.5000 mg | Freq: Four times a day (QID) | INTRAMUSCULAR | Status: DC | PRN
  Administered 2013-01-20 – 2013-01-21 (×3): 12.5 mg via INTRAVENOUS
  Filled 2013-01-20 (×3): qty 1

## 2013-01-20 NOTE — H&P (Signed)
[facility] History and Physical  Loretta Alexander ZOX:[id number] DOB: 04/10/1981 DOA: 01/20/2013  Referring physician: Dr. Silverio Lay PCP: Sissy Hoff, MD  Specialists: Neurology, Dr. Cyril Mourning  Chief Complaint: migraine HA with left sided weakness and double vision  HPI: Loretta Alexander is a 32 y.o. female has a past medical history significant for chronic abdominal pain, migraine HA, presents to the ED with a chief complaint of a migraine HA which started about 6 days ago (hasn't had migraines in about 5 years) with intermittent left sided weakness (which is somewhat chronic being present for few months), both upper and lower extremity, worsening for the past 3 days as well as double vision. She also has right sided abdominal pain with nausea and inability to have adequate po intake for the past 3 days. Endorses fevers at home. She denies chest pain. She endorses shortness of breath, more so today but on and off for "a while". Had some dysuria couple of weeks ago, but better now. In the ED, neurology has been consulted and recommended MRI of the brain and if normal and if her HA is controlled she could go home. Patient could not tolerate the MRI twice due to both anxiety and severe headache and could not stand still due to that. EDP contacted neurology who recommended admission for observation,  repeat MRI as tolerated and will follow up patient in the morning.   Review of Systems:   Past Medical History  Diagnosis Date  . Migraines   . Pneumonia     04/2010  . Eye infection     within last 2 weeks  . Depression   . Complication of anesthesia     Difficult IV stick with Central line placement  . Intestinal obstruction   . Gastritis 03/2011    Per EGD  . Obesity   . Ovarian cyst, right     Hx of oophorectomy for adhesions  . Drug-seeking behavior   . Renal disorder     Kidney Stones   Past Surgical History  Procedure Laterality Date  . Tonsillectomy  1991  . Appendectomy  1992  .  Cholecystectomy  2003  . Right ovary removed  2006  . Abdominal adhesion surgery  2006  . Knee arthroscopy  2001, 2010    left knee x 2  . Hernia repair      umbilical  . Nasal septoplasty w/ turbinoplasty  03/16/2011    Procedure: NASAL SEPTOPLASTY WITH TURBINATE REDUCTION;  Surgeon: Susy Frizzle, MD;  Location: [facility] OR;  Service: ENT;  Laterality: Bilateral;  . Laparoscopic gastric banding    . Removal of lap band    . Pilonidal cyst excision    . Esophagogastroduodenoscopy  04/05/2011    Procedure: ESOPHAGOGASTRODUODENOSCOPY (EGD);  Surgeon: Charna Elizabeth, MD;  Location: [facility];  Service: Endoscopy;  Laterality: N/A;   Social History:  reports that she has never smoked. She has never used smokeless tobacco. She reports that  drinks alcohol. She reports that she does not use illicit drugs.  Allergies  Allergen Reactions  . Toradol [Ketorolac Tromethamine] Nausea And Vomiting  . Compazine [Prochlorperazine Maleate] Anxiety and Other (See Comments)    dizziness  . Haloperidol And Related Anxiety and Other (See Comments)    dizziness  . Reglan [Metoclopramide Hcl] Anxiety and Other (See Comments)    dizziness    History reviewed. No pertinent family history.  Prior to Admission medications   Medication Sig Start Date End Date Taking? Authorizing Provider  DULoxetine (CYMBALTA) 60 MG capsule Take 60 mg by mouth daily.     Yes Historical Provider, MD  QUEtiapine (SEROQUEL) 25 MG tablet Take 75 mg by mouth at bedtime as needed. For migraines   Yes Historical Provider, MD  levonorgestrel (MIRENA) 20 MCG/24HR IUD 1 each by Intrauterine route once. 2010    Historical Provider, MD   Physical Exam: Filed Vitals:   01/20/13 1328  BP: 136/99  Pulse: 110  Temp: 97.8 F (36.6 C)  TempSrc: Oral  Resp: 18  SpO2: 98%     General:  No apparent distress, sitting in dark with the lights off and sunglasses on.   Eyes: no scleral icterus  Neck: no JVD  Cardiovascular: regular rate  without MRG; 2+ peripheral pulses  Respiratory: CTA biL, good air movement without wheezing, rhonchi or crackled  Abdomen: soft, mild tenderness to palpation in the epigastric area  Skin: no rashes  Musculoskeletal: no peripheral edema  Psychiatric: normal mood and affect  Neurologic: 5-/5 on left, may not give full effort, 5/5 right.   Labs on Admission:  Basic Metabolic Panel:  Recent Labs Lab 01/20/13 1333  NA 137  K 4.0  CL 103  CO2 20  GLUCOSE 129*  BUN 13  CREATININE 0.60  CALCIUM 9.9   Liver Function Tests:  Recent Labs Lab 01/20/13 1333  AST 16  ALT 22  ALKPHOS 88  BILITOT 0.3  PROT 8.0  ALBUMIN 4.3    Recent Labs Lab 01/20/13 1333  LIPASE 41   CBC:  Recent Labs Lab 01/20/13 1333  WBC 11.5*  HGB 15.3*  HCT 42.4  MCV 87.2  PLT 365   Radiological Exams on Admission: Ct Head Wo Contrast  01/20/2013   CLINICAL DATA:  Headache.  EXAM: CT HEAD WITHOUT CONTRAST  TECHNIQUE: Contiguous axial images were obtained from the base of the skull through the vertex without intravenous contrast.  COMPARISON:  10/10/2009  FINDINGS: The brain demonstrates no evidence of hemorrhage, infarction, edema, mass effect, extra-axial fluid collection, hydrocephalus or mass lesion. The skull is unremarkable.  IMPRESSION: No acute findings.   Electronically Signed   By: Irish Lack   On: 01/20/2013 17:31   Dg Abd Acute W/chest  01/20/2013   CLINICAL DATA:  Abdominal pain. Nausea and vomiting.  EXAM: ACUTE ABDOMEN SERIES (ABDOMEN 2 VIEW & CHEST 1 VIEW)  COMPARISON:  Abdominal series 10/12/2012.  FINDINGS: Lung volumes are low. No consolidative airspace disease. No pleural effusions. No evidence of pulmonary edema. Heart size is normal. Mediastinal contours were unremarkable.  Surgical clips project over the right upper quadrant of the abdomen, likely from prior cholecystectomy. Gas and stool is noted throughout the colon extending to the level of the distal rectum. No  definite pathologic dilatation of small bowel is identified. Some air-fluid levels are noted scattered throughout the bowel on the upright projection, nonspecific. No pneumoperitoneum.  IMPRESSION: 1. Nonspecific common nonobstructive bowel gas pattern, as above. 2. No pneumoperitoneum. 3. Status post cholecystectomy. 4. Low lung volumes without radiographic evidence of acute cardiopulmonary disease.   Electronically Signed   By: Trudie Reed M.D.   On: 01/20/2013 17:21   Assessment/Plan Principal Problem:   Migraine headache Active Problems:   Abdominal pain   Nausea and vomiting   Diplopia   Left-sided weakness   Migraine headache - appreciate neurology input. Will try and control her HA and pursue MRI in the morning. Consider PT consult in am if weakness persists. Left sided weakness - may  be due to #1, MRI will be helpful to r/o other etiologies. Acute on chronic abdominal pain with nausea - nausea might be related to her headache. Previous hospitalizations for same in 2013. Phenergan, IVF.  UTI - borderline WBC elevation, UA positive, some symptoms recently. Will treat with Ceftriaxone.  DVT Prophylaxis - Lovenox   Code Status: Full  Family Communication:  none  Disposition Plan: obs  Time spent: 74  Teruko Joswick M. Elvera Lennox, MD [facility] Pager [phone]  If 7PM-7AM, please contact night-coverage [email or web address] Password [facility] 01/20/2013, 9:29 PM

## 2013-01-20 NOTE — ED Notes (Signed)
Pt reports headache x6 days, pain 7/10. Left sided weakness. Reports weight loss of 10-15 pounds in last month, also swollen lymph nodes.

## 2013-01-20 NOTE — Progress Notes (Signed)
Pt constantly asking for pain Medcation, just after it was administered. States it is not helping

## 2013-01-20 NOTE — ED Notes (Signed)
MRI CALLED, patient refusing scan. Md aware.

## 2013-01-20 NOTE — ED Provider Notes (Addendum)
CSN: [id number]     Arrival date & time 01/20/13  1305 History   First MD Initiated Contact with Patient 01/20/13 1459     Chief Complaint  Patient presents with  . Headache  . weight loss, swollen lymph nodes    (Consider location/radiation/quality/duration/timing/severity/associated sxs/prior Treatment) The history is provided by the patient.  Loretta Alexander is a 32 y.o. female hx of migraines, depression, intestinal obstruction due to adhesions, drug-seeking behavior here presenting with headache and left-sided weakness. Intermittent headaches for the last week. She occasionally has left-sided weakness associated with the headaches. Also notes that her chronic abdominal pain got worse during the last week when she has the headaches. She also has some blurry vision associated with headaches. She said this is similar to her previous headaches for more severe and the left side weakness is new. Has some fever 101 and has been losing weight about 15 pounds the last 2 weeks. Denies any neck pain or neck stiffness. She had some right lower quadrant and right upper quadrant abdominal pain that got worse over the last week. She has been vomiting and unable to keep food down. She had SBO from adhesions previously. Finally, she notes that her lymph nodes swells up occasionally.    Past Medical History  Diagnosis Date  . Migraines   . Pneumonia     04/2010  . Eye infection     within last 2 weeks  . Depression   . Complication of anesthesia     Difficult IV stick with Central line placement  . Intestinal obstruction   . Gastritis 03/2011    Per EGD  . Obesity   . Ovarian cyst, right     Hx of oophorectomy for adhesions  . Drug-seeking behavior   . Renal disorder     Kidney Stones   Past Surgical History  Procedure Laterality Date  . Tonsillectomy  1991  . Appendectomy  1992  . Cholecystectomy  2003  . Right ovary removed  2006  . Abdominal adhesion surgery  2006  . Knee arthroscopy   2001, 2010    left knee x 2  . Hernia repair      umbilical  . Nasal septoplasty w/ turbinoplasty  03/16/2011    Procedure: NASAL SEPTOPLASTY WITH TURBINATE REDUCTION;  Surgeon: Susy Frizzle, MD;  Location: [facility] OR;  Service: ENT;  Laterality: Bilateral;  . Laparoscopic gastric banding    . Removal of lap band    . Pilonidal cyst excision    . Esophagogastroduodenoscopy  04/05/2011    Procedure: ESOPHAGOGASTRODUODENOSCOPY (EGD);  Surgeon: Charna Elizabeth, MD;  Location: [facility];  Service: Endoscopy;  Laterality: N/A;   History reviewed. No pertinent family history. History  Substance Use Topics  . Smoking status: Never Smoker   . Smokeless tobacco: Never Used  . Alcohol Use: Yes     Comment: rarely   OB History   Grav Para Term Preterm Abortions TAB SAB Ect Mult Living                 Review of Systems  Gastrointestinal: Positive for vomiting and abdominal pain.  Neurological: Positive for headaches.    Allergies  Toradol; Compazine; Haloperidol and related; and Reglan  Home Medications   Current Outpatient Rx  Name  Route  Sig  Dispense  Refill  . DULoxetine (CYMBALTA) 60 MG capsule   Oral   Take 60 mg by mouth daily.           Marland Kitchen  QUEtiapine (SEROQUEL) 25 MG tablet   Oral   Take 75 mg by mouth at bedtime as needed. For migraines         . levonorgestrel (MIRENA) 20 MCG/24HR IUD   Intrauterine   1 each by Intrauterine route once. 2010          BP 136/99  Pulse 110  Temp(Src) 97.8 F (36.6 C) (Oral)  Resp 18  SpO2 98% Physical Exam  Nursing note and vitals reviewed. Constitutional: She is oriented to person, place, and time.  Uncomfortable  HENT:  Head: Normocephalic and atraumatic.  Mouth/Throat: Oropharynx is clear and moist.  Eyes: Conjunctivae are normal. Pupils are equal, round, and reactive to light.  + photophobia. Fundus showed nl disc.   Neck: Normal range of motion. Neck supple.  Cardiovascular: Regular rhythm and normal heart sounds.    tachy  Pulmonary/Chest: Effort normal and breath sounds normal. No respiratory distress. She has no wheezes. She has no rales.  Abdominal: Soft. Bowel sounds are normal.  Mild diffuse tenderness, worse in RUQ. No rebound or murphy's sign   Musculoskeletal: Normal range of motion. She exhibits no edema.  Neurological: She is alert and oriented to person, place, and time.  Strength 4/5 L arm and leg. Nl sensation and reflexes. Nl gait.   Skin: Skin is warm and dry.  Psychiatric: She has a normal mood and affect. Her behavior is normal. Judgment and thought content normal.    ED Course  Procedures (including critical care time) Angiocath insertion Performed by: Silverio Lay, DAVID  Consent: Verbal consent obtained. Risks and benefits: risks, benefits and alternatives were discussed Time out: Immediately prior to procedure a "time out" was called to verify the correct patient, procedure, equipment, support staff and site/side marked as required.  Preparation: Patient was prepped and draped in the usual sterile fashion.  Vein Location: L antecube  Ultrasound Guided  Gauge: 20 gauge long angiocath   Normal blood return and flush without difficulty Patient tolerance: Patient tolerated the procedure well with no immediate complications.     Labs Review Labs Reviewed  CBC - Abnormal; Notable for the following:    WBC 11.5 (*)    Hemoglobin 15.3 (*)    MCHC 36.1 (*)    All other components within normal limits  BASIC METABOLIC PANEL - Abnormal; Notable for the following:    Glucose, Bld 129 (*)    All other components within normal limits  URINALYSIS, ROUTINE W REFLEX MICROSCOPIC - Abnormal; Notable for the following:    APPearance TURBID (*)    Hgb urine dipstick MODERATE (*)    Ketones, ur 15 (*)    Protein, ur 30 (*)    Leukocytes, UA LARGE (*)    All other components within normal limits  URINE MICROSCOPIC-ADD ON - Abnormal; Notable for the following:    Squamous Epithelial /  LPF FEW (*)    All other components within normal limits  HEPATIC FUNCTION PANEL  LIPASE, BLOOD  PREGNANCY, URINE   Imaging Review Ct Head Wo Contrast  01/20/2013   CLINICAL DATA:  Headache.  EXAM: CT HEAD WITHOUT CONTRAST  TECHNIQUE: Contiguous axial images were obtained from the base of the skull through the vertex without intravenous contrast.  COMPARISON:  10/10/2009  FINDINGS: The brain demonstrates no evidence of hemorrhage, infarction, edema, mass effect, extra-axial fluid collection, hydrocephalus or mass lesion. The skull is unremarkable.  IMPRESSION: No acute findings.   Electronically Signed   By: Irish Lack  On: 01/20/2013 17:31   Dg Abd Acute W/chest  01/20/2013   CLINICAL DATA:  Abdominal pain. Nausea and vomiting.  EXAM: ACUTE ABDOMEN SERIES (ABDOMEN 2 VIEW & CHEST 1 VIEW)  COMPARISON:  Abdominal series 10/12/2012.  FINDINGS: Lung volumes are low. No consolidative airspace disease. No pleural effusions. No evidence of pulmonary edema. Heart size is normal. Mediastinal contours were unremarkable.  Surgical clips project over the right upper quadrant of the abdomen, likely from prior cholecystectomy. Gas and stool is noted throughout the colon extending to the level of the distal rectum. No definite pathologic dilatation of small bowel is identified. Some air-fluid levels are noted scattered throughout the bowel on the upright projection, nonspecific. No pneumoperitoneum.  IMPRESSION: 1. Nonspecific common nonobstructive bowel gas pattern, as above. 2. No pneumoperitoneum. 3. Status post cholecystectomy. 4. Low lung volumes without radiographic evidence of acute cardiopulmonary disease.   Electronically Signed   By: Trudie Reed M.D.   On: 01/20/2013 17:21    MDM  No diagnosis found. Loretta Alexander is a 32 y.o. female here with headache, weakness, abdominal pain. Headache and weakness likely either complicated migraine vs MS vs poor effort and less likely to be early stroke.  Abdominal pain chronic and will get xray to r/o another SBO given her history. Will give headache cocktail and pain meds and reassess.   7 PM Dr. Cyril Mourning saw patient and recommend MRI brain. If MRI normal can d/c home. However, patient was vomiting in MRI and was claustrophobic. Will give ativan and retry.   9:09 PM Patient given ativan and versed but still can't tolerate MRI. I called Dr. Thad Ranger from neurology, who recommend admission for observation and neuro will see her tomorrow AM for further management. I still think she likely has complicated migraine and possible drug seeking behavior. She has received multiple rounds of pain meds and phenergan without much relief. She also has possible pyelonephritis and given ceftriaxone. Will admit to tele under Dr. Elvera Lennox.    Richardean Canal, MD 01/20/13 2110  Richardean Canal, MD 01/20/13 [phone]

## 2013-01-20 NOTE — Consult Note (Signed)
NEURO HOSPITALIST CONSULT NOTE    Reason for Consult: HA with left sided weakness, numbness, and double vision  HPI:                                                                                                                                          Loretta Alexander is an 32 y.o. female, right handed, with a past medical history significant for episodic migraine without aura, comes in today for further evaluation of the above mentioned symptoms. She said that she hasn't had a migraine for the last 5 years and what she is experiencing now is a severe migraine that started 6 days ago associated with weakness and numbness of the left arm and leg that got worse in the last 3 days. In addition, she said that she is having double vision. Stated that her last migraine was 5 years ago. Of note, she mentioned that the left sided weakness has been present off and on for several months, but worsened just 3 days ago to the point that she is having trouble walking and moving that side..   Past Medical History  Diagnosis Date  . Migraines   . Pneumonia     04/2010  . Eye infection     within last 2 weeks  . Depression   . Complication of anesthesia     Difficult IV stick with Central line placement  . Intestinal obstruction   . Gastritis 03/2011    Per EGD  . Obesity   . Ovarian cyst, right     Hx of oophorectomy for adhesions  . Drug-seeking behavior   . Renal disorder     Kidney Stones    Past Surgical History  Procedure Laterality Date  . Tonsillectomy  1991  . Appendectomy  1992  . Cholecystectomy  2003  . Right ovary removed  2006  . Abdominal adhesion surgery  2006  . Knee arthroscopy  2001, 2010    left knee x 2  . Hernia repair      umbilical  . Nasal septoplasty w/ turbinoplasty  03/16/2011    Procedure: NASAL SEPTOPLASTY WITH TURBINATE REDUCTION;  Surgeon: Susy Frizzle, MD;  Location: [facility] OR;  Service: ENT;  Laterality: Bilateral;  . Laparoscopic  gastric banding    . Removal of lap band    . Pilonidal cyst excision    . Esophagogastroduodenoscopy  04/05/2011    Procedure: ESOPHAGOGASTRODUODENOSCOPY (EGD);  Surgeon: Charna Elizabeth, MD;  Location: [facility];  Service: Endoscopy;  Laterality: N/A;    History reviewed. No pertinent family history.   Social History:  reports that she has never smoked. She has never used smokeless tobacco. She reports that  drinks alcohol. She reports that she does not use illicit drugs.  Allergies  Allergen Reactions  . Toradol [Ketorolac Tromethamine] Nausea And Vomiting  . Compazine [Prochlorperazine Maleate] Anxiety and Other (See Comments)    dizziness  . Haloperidol And Related Anxiety and Other (See Comments)    dizziness  . Reglan [Metoclopramide Hcl] Anxiety and Other (See Comments)    dizziness    MEDICATIONS:                                                                                                                     I have reviewed the patient's current medications.   ROS:                                                                                                                                       History obtained from the patient  General ROS: negative for - chills, fatigue, fever, night sweats, or weight loss Psychological ROS: negative for - behavioral disorder, hallucinations, memory difficulties, mood swings or suicidal ideation Ophthalmic ROS: negative for - blurry vision, double vision, eye pain or loss of vision ENT ROS: negative for - epistaxis, nasal discharge, oral lesions, sore throat, tinnitus or vertigo Allergy and Immunology ROS: negative for - hives or itchy/watery eyes Hematological and Lymphatic ROS: negative for - bleeding problems, bruising or swollen lymph nodes Endocrine ROS: negative for - galactorrhea, hair pattern changes, polydipsia/polyuria or temperature intolerance Respiratory ROS: negative for - cough, hemoptysis, shortness of breath or  wheezing Cardiovascular ROS: negative for - chest pain, dyspnea on exertion, edema or irregular heartbeat Gastrointestinal ROS: negative for - hematemesis  or stool incontinence Genito-Urinary ROS: negative for - dysuria, hematuria, incontinence or urinary frequency/urgency Musculoskeletal ROS: negative for - joint swelling  Neurological ROS: as noted in HPI Dermatological ROS: negative for rash and skin lesion changes   Physical exam: pleasant female in no apparent distress. Head: normocephalic. Neck: supple, no bruits, no JVD. Cardiac: no murmurs. Lungs: clear. Abdomen: soft, no tender, no mass. Extremities: no edema.  Blood pressure 136/99, pulse 110, temperature 97.8 F (36.6 C), temperature source Oral, resp. rate 18, SpO2 98.00%.   Neurologic Examination:  Mental Status: Alert, awake, oriented x 4, thought content appropriate. Comprehension, naming, and repetition intact.  Speech fluent without evidence of aphasia.  Able to follow 3 step commands without difficulty. Cranial Nerves: II: Discs flat bilaterally; Visual fields grossly normal, pupils equal, round, reactive to light and accommodation III,IV, VI: ptosis not present, extra-ocular motions intact bilaterally V,VII: smile symmetric, facial light touch sensation normal bilaterally VIII: hearing normal bilaterally IX,X: gag reflex present XI: bilateral shoulder shrug XII: midline tongue extension Motor: Right : 5/5. There is weakness of the left arm-leg but I am afraid she is not giving me full effort. Tone and bulk:normal tone throughout; no atrophy noted Sensory: Pinprick and light touch intact diminished in the left. Deep Tendon Reflexes:  Right: Upper Extremity   Left: Upper extremity   biceps (C-5 to C-6) 2/4   biceps (C-5 to C-6) 2/4 tricep (C7) 2/4    triceps (C7) 2/4 Brachioradialis (C6) 2/4  Brachioradialis  (C6) 2/4  Lower Extremity Lower Extremity  quadriceps (L-2 to L-4) 2/4   quadriceps (L-2 to L-4) 2/4 Achilles (S1) 2/4   Achilles (S1) 2/4  Plantars: Right: downgoing   Left: downgoing Cerebellar: normal finger-to-nose,  normal heel-to-shin test Gait: No tested. CV: pulses palpable throughout    No results found for this basename: cbc, bmp, coags, chol, tri, ldl, hga1c    Results for orders placed during the hospital encounter of 01/20/13 (from the past 48 hour(s))  CBC     Status: Abnormal   Collection Time    01/20/13  1:33 PM      Result Value Range   WBC 11.5 (*) 4.0 - 10.5 K/uL   RBC 4.86  3.87 - 5.11 MIL/uL   Hemoglobin 15.3 (*) 12.0 - 15.0 g/dL   HCT 14.7  82.9 - 56.2 %   MCV 87.2  78.0 - 100.0 fL   MCH 31.5  26.0 - 34.0 pg   MCHC 36.1 (*) 30.0 - 36.0 g/dL   RDW 13.0  86.5 - 78.4 %   Platelets 365  150 - 400 K/uL  BASIC METABOLIC PANEL     Status: Abnormal   Collection Time    01/20/13  1:33 PM      Result Value Range   Sodium 137  135 - 145 mEq/L   Potassium 4.0  3.5 - 5.1 mEq/L   Chloride 103  96 - 112 mEq/L   CO2 20  19 - 32 mEq/L   Glucose, Bld 129 (*) 70 - 99 mg/dL   BUN 13  6 - 23 mg/dL   Creatinine, Ser 6.96  0.50 - 1.10 mg/dL   Calcium 9.9  8.4 - 29.5 mg/dL   GFR calc non Af Amer >90  >90 mL/min   GFR calc Af Amer >90  >90 mL/min   Comment: (NOTE)     The eGFR has been calculated using the CKD EPI equation.     This calculation has not been validated in all clinical situations.     eGFR's persistently <90 mL/min signify possible Chronic Kidney     Disease.  HEPATIC FUNCTION PANEL     Status: None   Collection Time    01/20/13  1:33 PM      Result Value Range   Total Protein 8.0  6.0 - 8.3 g/dL   Albumin 4.3  3.5 - 5.2 g/dL   AST 16  0 - 37 U/L   ALT 22  0 - 35 U/L   Alkaline Phosphatase 88  39 - 117 U/L   Total Bilirubin 0.3  0.3 - 1.2 mg/dL   Bilirubin, Direct <6.5  0.0 - 0.3 mg/dL   Indirect Bilirubin NOT CALCULATED  0.3 - 0.9 mg/dL   LIPASE, BLOOD     Status: None   Collection Time    01/20/13  1:33 PM      Result Value Range   Lipase 41  11 - 59 U/L  URINALYSIS, ROUTINE W REFLEX MICROSCOPIC     Status: Abnormal   Collection Time    01/20/13  4:06 PM      Result Value Range   Color, Urine YELLOW  YELLOW   APPearance TURBID (*) CLEAR   Specific Gravity, Urine 1.026  1.005 - 1.030   pH 5.0  5.0 - 8.0   Glucose, UA NEGATIVE  NEGATIVE mg/dL   Hgb urine dipstick MODERATE (*) NEGATIVE   Bilirubin Urine NEGATIVE  NEGATIVE   Ketones, ur 15 (*) NEGATIVE mg/dL   Protein, ur 30 (*) NEGATIVE mg/dL   Urobilinogen, UA 0.2  0.0 - 1.0 mg/dL   Nitrite NEGATIVE  NEGATIVE   Leukocytes, UA LARGE (*) NEGATIVE  URINE MICROSCOPIC-ADD ON     Status: Abnormal   Collection Time    01/20/13  4:06 PM      Result Value Range   Squamous Epithelial / LPF FEW (*) RARE   WBC, UA 21-50  <3 WBC/hpf   RBC / HPF 21-50  <3 RBC/hpf   Urine-Other MUCOUS PRESENT      Ct Head Wo Contrast  01/20/2013   CLINICAL DATA:  Headache.  EXAM: CT HEAD WITHOUT CONTRAST  TECHNIQUE: Contiguous axial images were obtained from the base of the skull through the vertex without intravenous contrast.  COMPARISON:  10/10/2009  FINDINGS: The brain demonstrates no evidence of hemorrhage, infarction, edema, mass effect, extra-axial fluid collection, hydrocephalus or mass lesion. The skull is unremarkable.  IMPRESSION: No acute findings.   Electronically Signed   By: Irish Lack   On: 01/20/2013 17:31   Dg Abd Acute W/chest  01/20/2013   CLINICAL DATA:  Abdominal pain. Nausea and vomiting.  EXAM: ACUTE ABDOMEN SERIES (ABDOMEN 2 VIEW & CHEST 1 VIEW)  COMPARISON:  Abdominal series 10/12/2012.  FINDINGS: Lung volumes are low. No consolidative airspace disease. No pleural effusions. No evidence of pulmonary edema. Heart size is normal. Mediastinal contours were unremarkable.  Surgical clips project over the right upper quadrant of the abdomen, likely from prior  cholecystectomy. Gas and stool is noted throughout the colon extending to the level of the distal rectum. No definite pathologic dilatation of small bowel is identified. Some air-fluid levels are noted scattered throughout the bowel on the upright projection, nonspecific. No pneumoperitoneum.  IMPRESSION: 1. Nonspecific common nonobstructive bowel gas pattern, as above. 2. No pneumoperitoneum. 3. Status post cholecystectomy. 4. Low lung volumes without radiographic evidence of acute cardiopulmonary disease.   Electronically Signed   By: Trudie Reed M.D.   On: 01/20/2013 17:21     Assessment/Plan: HA and left sided weakness-numbness, double vision for few days. Of note, she reports chronic intermittent left sided weakness. Migraine with aura??.  Doubt mass occupying space or demyelinating disease.Some functional component on neuro-exam.  Recommend: MRI brain with and without contrast. She can probably go home if her MRI brain is normal and her HA is controlled.  Wyatt Portela, MD [facility] [phone]  01/20/2013, 5:50 PM

## 2013-01-20 NOTE — ED Notes (Signed)
Pt back from MRI 

## 2013-01-21 ENCOUNTER — Observation Stay (HOSPITAL_COMMUNITY): Payer: BC Managed Care – PPO

## 2013-01-21 ENCOUNTER — Encounter (HOSPITAL_COMMUNITY): Payer: Self-pay

## 2013-01-21 DIAGNOSIS — R51 Headache: Secondary | ICD-10-CM

## 2013-01-21 MED ORDER — IBUPROFEN 600 MG PO TABS: 600.0000 mg | ORAL_TABLET | Freq: Three times a day (TID) | ORAL | Status: DC | PRN

## 2013-01-21 MED ORDER — DEXAMETHASONE SODIUM PHOSPHATE 10 MG/ML IJ SOLN
10.0000 mg | Freq: Once | INTRAMUSCULAR | Status: AC
  Administered 2013-01-21: 10 mg via INTRAVENOUS
  Filled 2013-01-21: qty 1

## 2013-01-21 MED ORDER — BUTALBITAL-APAP-CAFFEINE 50-325-40 MG PO TABS
1.0000 | ORAL_TABLET | Freq: Once | ORAL | Status: AC
  Administered 2013-01-21: 1 via ORAL
  Filled 2013-01-21: qty 1

## 2013-01-21 MED ORDER — CIPROFLOXACIN HCL 250 MG PO TABS: 500.0000 mg | ORAL_TABLET | Freq: Two times a day (BID) | ORAL | Status: DC

## 2013-01-21 MED ORDER — LORAZEPAM 2 MG/ML IJ SOLN
1.0000 mg | Freq: Once | INTRAMUSCULAR | Status: AC
  Administered 2013-01-21: 1 mg via INTRAVENOUS
  Filled 2013-01-21 (×2): qty 1

## 2013-01-21 MED ORDER — INFLUENZA VAC SPLIT QUAD 0.5 ML IM SUSP
0.5000 mL | Freq: Once | INTRAMUSCULAR | Status: AC
  Administered 2013-01-21: 0.5 mL via INTRAMUSCULAR
  Filled 2013-01-21: qty 0.5

## 2013-01-21 NOTE — Progress Notes (Signed)
Patient constantly complains of headache and emesis not seen by nursing staff. Patient has been asked to allow staff to see emesis. She constantly asks to receive a meds early after being informed that we will not be able to do so. She displays bizarre behavior. 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 She also suddenly became very tearful and explained that she has brain lesions & had been diagnosed with non-hodgkins lymphoma, but that she was aware that it was not listed as a part of her medical history. Patient has been ambulatory in her and has only required standby assistance. Nursing will continue to monitor the patient.

## 2013-01-21 NOTE — Progress Notes (Signed)
Patient declines MyChart activation 

## 2013-01-21 NOTE — Progress Notes (Signed)
Patient is constantly requesting more iv narcotics asking for specific medication orders. Patient asks for medications shortly after they have been administered, when they can be given again, and/or can she have them early or together. She has been told that medication will not be given early and they will be administered safely per doctor order. She is now stating "you should call the doctor and ask them to given me an extra once dose of phenergan and dilaudid. I asked nurses to do it for me before". She no longer states that she has emesis but that she now has dry heaves. Attending will be notified and made aware of patient status. Vital signs were stable upon admission and are this morning as well. Nursing will continue to assess and intervene when necessary.

## 2013-01-21 NOTE — Discharge Summary (Addendum)
Physician Discharge Summary  ELFA WOOTON BMW:[id number] DOB: [birth year] DOA: 01/20/2013  PCP: Sissy Hoff, MD  Admit date: 01/20/2013 Discharge date: 01/21/2013  Time spent: 45 minutes  Recommendations for Outpatient Follow-up:  1. [facility] in 1-2weeks  Discharge Diagnoses:  Principal Problem:   Migraine headache Active Problems:   Abdominal pain   Nausea and vomiting   Diplopia   Left-sided weakness   Discharge Condition: stable  Diet recommendation: Bland diet, advance as tolerated  Filed Weights   01/20/13 2230  Weight: 107.956 kg (238 lb)    History of present illness:  Loretta Alexander is a 32 y.o. female has a past medical history significant for chronic abdominal pain, migraine HA, presents to the ED with a chief complaint of a migraine HA which started about 6 days ago (hasn't had migraines in about 5 years) with intermittent left sided weakness (which is somewhat chronic being present for few months), both upper and lower extremity, worsening for the past 3 days as well as double vision. She also has right sided abdominal pain with nausea and inability to have adequate po intake for the past 3 days. Endorses fevers at home. She denies chest pain. She endorses shortness of breath, more so today but on and off for "a while". Had some dysuria couple of weeks ago, but better now. In the ED, neurology has been consulted and recommended MRI of the brain and if normal and if her HA is controlled she could go home. Patient could not tolerate the MRI twice due to both anxiety and severe headache   Hospital Course:  She was admitted with Severe migraine headache but reported h/o of L sided weakness, on exam poor effort noted on neuro exam especially involving the left side. Through the hospitalization, there was concern for drug seeking behavior noted by  Nurses and was documented by Staff RN. She finally underwent MRI Brain which was normal She was given decadron x1  and narcotics were discontinued She had some improvement in her symptoms and was discharged home on PRN Ibuprofen and advised to Fu with [facility]   Possible UTI with some suprapubic discomfort noted per pt, discharged home on PO ciprofloxacin for this.  Discharge Exam: Filed Vitals:   01/21/13 1356  BP: 94/62  Pulse: 86  Temp: 98.1 F (36.7 C)  Resp: 18    General: AAOx3, no distress Cardiovascular:S1S2/RRR Respiratory: CTAB Discharge Instructions  Discharge Orders   Future Orders Complete By Expires   Discharge instructions  As directed    Comments:     Bland diet, advance diet as tolerated   Increase activity slowly  As directed        Medication List         DULoxetine 60 MG capsule  Commonly known as:  CYMBALTA  Take 60 mg by mouth daily.     ibuprofen 600 MG tablet  Commonly known as:  ADVIL,MOTRIN  Take 1 tablet (600 mg total) by mouth every 8 (eight) hours as needed (for headache, do not use on empty stomach if possible).     levonorgestrel 20 MCG/24HR IUD  Commonly known as:  MIRENA  1 each by Intrauterine route once. 2010     QUEtiapine 25 MG tablet  Commonly known as:  SEROQUEL  Take 75 mg by mouth at bedtime as needed. For migraines       Allergies  Allergen Reactions  . Toradol [Ketorolac Tromethamine] Nausea And Vomiting  . Compazine [Prochlorperazine Maleate] Anxiety  and Other (See Comments)    dizziness  . Haloperidol And Related Anxiety and Other (See Comments)    dizziness  . Reglan [Metoclopramide Hcl] Anxiety and Other (See Comments)    dizziness       Follow-up Information   Follow up with Sissy Hoff, MD In 1 week.   Specialty:  Family Medicine   Contact information:   [address] [place] [address] [phone]       Follow up with Levert Feinstein, MD. Schedule an appointment as soon as possible for a visit in 2 weeks.   Specialty:  Neurology   Contact information:   [address] [place]  [address] [phone]        The results of significant diagnostics from this hospitalization (including imaging, microbiology, ancillary and laboratory) are listed below for reference.    Significant Diagnostic Studies: Ct Head Wo Contrast  01/20/2013   CLINICAL DATA:  Headache.  EXAM: CT HEAD WITHOUT CONTRAST  TECHNIQUE: Contiguous axial images were obtained from the base of the skull through the vertex without intravenous contrast.  COMPARISON:  10/10/2009  FINDINGS: The brain demonstrates no evidence of hemorrhage, infarction, edema, mass effect, extra-axial fluid collection, hydrocephalus or mass lesion. The skull is unremarkable.  IMPRESSION: No acute findings.   Electronically Signed   By: Irish Lack   On: 01/20/2013 17:31   Mr Brain Wo Contrast  01/21/2013   *RADIOLOGY REPORT*  Clinical Data: Severe posterior headache.  MRI HEAD WITHOUT CONTRAST  Technique:  Multiplanar, multiecho pulse sequences of the brain and surrounding structures were obtained according to standard protocol without intravenous contrast.  Comparison: 01/20/2013 head CT.  03/27/2005 brain MR.  Findings: No acute infarct.  No intracranial hemorrhage.  No hydrocephalus.  No intracranial mass lesion detected on this unenhanced exam.  Major intracranial vascular structures are patent.  Polypoid opacification inferior aspect right maxillary sinus.  Cervical medullary junction, pituitary region, pineal region and orbital structures unremarkable.  IMPRESSION: No acute infarct.  No intracranial hemorrhage.  No intracranial mass lesion detected on this unenhanced exam.  Polypoid opacification inferior aspect right maxillary sinus.   Original Report Authenticated By: Lacy Duverney, M.D.   Dg Abd Acute W/chest  01/20/2013   CLINICAL DATA:  Abdominal pain. Nausea and vomiting.  EXAM: ACUTE ABDOMEN SERIES (ABDOMEN 2 VIEW & CHEST 1 VIEW)  COMPARISON:  Abdominal series 10/12/2012.  FINDINGS: Lung volumes are low. No consolidative  airspace disease. No pleural effusions. No evidence of pulmonary edema. Heart size is normal. Mediastinal contours were unremarkable.  Surgical clips project over the right upper quadrant of the abdomen, likely from prior cholecystectomy. Gas and stool is noted throughout the colon extending to the level of the distal rectum. No definite pathologic dilatation of small bowel is identified. Some air-fluid levels are noted scattered throughout the bowel on the upright projection, nonspecific. No pneumoperitoneum.  IMPRESSION: 1. Nonspecific common nonobstructive bowel gas pattern, as above. 2. No pneumoperitoneum. 3. Status post cholecystectomy. 4. Low lung volumes without radiographic evidence of acute cardiopulmonary disease.   Electronically Signed   By: Trudie Reed M.D.   On: 01/20/2013 17:21    Microbiology: No results found for this or any previous visit (from the past 240 hour(s)).   Labs: Basic Metabolic Panel:  Recent Labs Lab 01/20/13 1333  NA 137  K 4.0  CL 103  CO2 20  GLUCOSE 129*  BUN 13  CREATININE 0.60  CALCIUM 9.9   Liver Function Tests:  Recent Labs Lab 01/20/13 1333  AST 16  ALT 22  ALKPHOS 88  BILITOT 0.3  PROT 8.0  ALBUMIN 4.3    Recent Labs Lab 01/20/13 1333  LIPASE 41   No results found for this basename: AMMONIA,  in the last 168 hours CBC:  Recent Labs Lab 01/20/13 1333  WBC 11.5*  HGB 15.3*  HCT 42.4  MCV 87.2  PLT 365   Cardiac Enzymes: No results found for this basename: CKTOTAL, CKMB, CKMBINDEX, TROPONINI,  in the last 168 hours BNP: BNP (last 3 results) No results found for this basename: PROBNP,  in the last 8760 hours CBG: No results found for this basename: GLUCAP,  in the last 168 hours     Signed:  Claude Swendsen  [facility] 01/21/2013, 5:06 PM

## 2013-07-13 ENCOUNTER — Emergency Department (HOSPITAL_COMMUNITY): Payer: BC Managed Care – PPO

## 2013-07-13 ENCOUNTER — Encounter (HOSPITAL_COMMUNITY): Payer: Self-pay | Admitting: Emergency Medicine

## 2013-07-13 ENCOUNTER — Emergency Department (HOSPITAL_COMMUNITY)
Admission: EM | Admit: 2013-07-13 | Discharge: 2013-07-13 | Disposition: A | Discharge status: Home / Self Care | Payer: BC Managed Care – PPO | Attending: Emergency Medicine | Admitting: Emergency Medicine

## 2013-07-13 DIAGNOSIS — Z87442 Personal history of urinary calculi: Secondary | ICD-10-CM | POA: Insufficient documentation

## 2013-07-13 DIAGNOSIS — Z975 Presence of (intrauterine) contraceptive device: Secondary | ICD-10-CM | POA: Insufficient documentation

## 2013-07-13 DIAGNOSIS — N23 Unspecified renal colic: Secondary | ICD-10-CM | POA: Insufficient documentation

## 2013-07-13 DIAGNOSIS — Z9889 Other specified postprocedural states: Secondary | ICD-10-CM | POA: Insufficient documentation

## 2013-07-13 DIAGNOSIS — G43909 Migraine, unspecified, not intractable, without status migrainosus: Secondary | ICD-10-CM | POA: Insufficient documentation

## 2013-07-13 DIAGNOSIS — Z792 Long term (current) use of antibiotics: Secondary | ICD-10-CM | POA: Insufficient documentation

## 2013-07-13 DIAGNOSIS — Z8669 Personal history of other diseases of the nervous system and sense organs: Secondary | ICD-10-CM | POA: Insufficient documentation

## 2013-07-13 DIAGNOSIS — Z3202 Encounter for pregnancy test, result negative: Secondary | ICD-10-CM | POA: Insufficient documentation

## 2013-07-13 DIAGNOSIS — F329 Major depressive disorder, single episode, unspecified: Secondary | ICD-10-CM | POA: Insufficient documentation

## 2013-07-13 DIAGNOSIS — Z87440 Personal history of urinary (tract) infections: Secondary | ICD-10-CM | POA: Insufficient documentation

## 2013-07-13 DIAGNOSIS — Z8742 Personal history of other diseases of the female genital tract: Secondary | ICD-10-CM | POA: Insufficient documentation

## 2013-07-13 DIAGNOSIS — Z8719 Personal history of other diseases of the digestive system: Secondary | ICD-10-CM | POA: Insufficient documentation

## 2013-07-13 DIAGNOSIS — R112 Nausea with vomiting, unspecified: Secondary | ICD-10-CM | POA: Insufficient documentation

## 2013-07-13 DIAGNOSIS — Z9089 Acquired absence of other organs: Secondary | ICD-10-CM | POA: Insufficient documentation

## 2013-07-13 DIAGNOSIS — Z79899 Other long term (current) drug therapy: Secondary | ICD-10-CM | POA: Insufficient documentation

## 2013-07-13 DIAGNOSIS — Z8701 Personal history of pneumonia (recurrent): Secondary | ICD-10-CM | POA: Insufficient documentation

## 2013-07-13 DIAGNOSIS — F3289 Other specified depressive episodes: Secondary | ICD-10-CM | POA: Insufficient documentation

## 2013-07-13 DIAGNOSIS — R000 Tachycardia, unspecified: Secondary | ICD-10-CM | POA: Insufficient documentation

## 2013-07-13 DIAGNOSIS — R339 Retention of urine, unspecified: Secondary | ICD-10-CM | POA: Insufficient documentation

## 2013-07-13 DIAGNOSIS — Z9884 Bariatric surgery status: Secondary | ICD-10-CM | POA: Insufficient documentation

## 2013-07-13 HISTORY — DX: Urinary tract infection, site not specified: N39.0

## 2013-07-13 LAB — CBC WITH DIFFERENTIAL/PLATELET
Basophils Absolute: 0 10*3/uL (ref 0.0–0.1)
Basophils Relative: 0 % (ref 0–1)
EOS ABS: 0.2 10*3/uL (ref 0.0–0.7)
Eosinophils Relative: 2 % (ref 0–5)
HCT: 38.7 % (ref 36.0–46.0)
HEMOGLOBIN: 13.5 g/dL (ref 12.0–15.0)
LYMPHS ABS: 2.7 10*3/uL (ref 0.7–4.0)
LYMPHS PCT: 30 % (ref 12–46)
MCH: 31 pg (ref 26.0–34.0)
MCHC: 34.9 g/dL (ref 30.0–36.0)
MCV: 89 fL (ref 78.0–100.0)
MONOS PCT: 5 % (ref 3–12)
Monocytes Absolute: 0.5 10*3/uL (ref 0.1–1.0)
NEUTROS ABS: 5.8 10*3/uL (ref 1.7–7.7)
NEUTROS PCT: 63 % (ref 43–77)
PLATELETS: 365 10*3/uL (ref 150–400)
RBC: 4.35 MIL/uL (ref 3.87–5.11)
RDW: 13.2 % (ref 11.5–15.5)
WBC: 9.2 10*3/uL (ref 4.0–10.5)

## 2013-07-13 LAB — URINALYSIS, ROUTINE W REFLEX MICROSCOPIC
BILIRUBIN URINE: NEGATIVE
Glucose, UA: NEGATIVE mg/dL
Ketones, ur: NEGATIVE mg/dL
Nitrite: NEGATIVE
PH: 7 (ref 5.0–8.0)
Protein, ur: 100 mg/dL — AB
SPECIFIC GRAVITY, URINE: 1.009 (ref 1.005–1.030)
UROBILINOGEN UA: 0.2 mg/dL (ref 0.0–1.0)

## 2013-07-13 LAB — COMPREHENSIVE METABOLIC PANEL
ALT: 18 U/L (ref 0–35)
AST: 16 U/L (ref 0–37)
Albumin: 4 g/dL (ref 3.5–5.2)
Alkaline Phosphatase: 82 U/L (ref 39–117)
BUN: 10 mg/dL (ref 6–23)
CALCIUM: 9.5 mg/dL (ref 8.4–10.5)
CO2: 24 meq/L (ref 19–32)
Chloride: 101 mEq/L (ref 96–112)
Creatinine, Ser: 0.56 mg/dL (ref 0.50–1.10)
GFR calc Af Amer: >90 mL/min (ref >90)
Glucose, Bld: 160 mg/dL — ABNORMAL HIGH (ref 70–99)
Potassium: 3.7 mEq/L (ref 3.7–5.3)
Sodium: 139 mEq/L (ref 137–147)
Total Bilirubin: 0.4 mg/dL (ref 0.3–1.2)
Total Protein: 7.8 g/dL (ref 6.0–8.3)

## 2013-07-13 LAB — LIPASE, BLOOD: Lipase: 107 U/L — ABNORMAL HIGH (ref 11–59)

## 2013-07-13 LAB — POC URINE PREG, ED: PREG TEST UR: NEGATIVE

## 2013-07-13 LAB — URINE MICROSCOPIC-ADD ON

## 2013-07-13 MED ORDER — ONDANSETRON HCL 4 MG/2ML IJ SOLN
4.0000 mg | Freq: Once | INTRAMUSCULAR | Status: AC
  Administered 2013-07-13: 4 mg via INTRAVENOUS
  Filled 2013-07-13: qty 2

## 2013-07-13 MED ORDER — DIPHENHYDRAMINE HCL 50 MG/ML IJ SOLN
12.5000 mg | Freq: Once | INTRAMUSCULAR | Status: AC
  Administered 2013-07-13: 12.5 mg via INTRAVENOUS
  Filled 2013-07-13: qty 1

## 2013-07-13 MED ORDER — HYDROMORPHONE HCL PF 1 MG/ML IJ SOLN
1.0000 mg | Freq: Once | INTRAMUSCULAR | Status: AC
  Administered 2013-07-13: 1 mg via INTRAVENOUS
  Filled 2013-07-13: qty 1

## 2013-07-13 MED ORDER — OXYCODONE-ACETAMINOPHEN 5-325 MG PO TABS: 1.0000 | ORAL_TABLET | ORAL | Status: DC | PRN

## 2013-07-13 MED ORDER — SODIUM CHLORIDE 0.9 % IV BOLUS (SEPSIS)
1000.0000 mL | Freq: Once | INTRAVENOUS | Status: AC
  Administered 2013-07-13: 1000 mL via INTRAVENOUS

## 2013-07-13 MED ORDER — PROMETHAZINE HCL 25 MG PO TABS: 25.0000 mg | ORAL_TABLET | Freq: Four times a day (QID) | ORAL | Status: DC | PRN

## 2013-07-13 NOTE — ED Provider Notes (Signed)
CSN: [id number]     Arrival date & time 07/13/13  1512 History   First MD Initiated Contact with Patient 07/13/13 1529     Chief Complaint  Patient presents with  . Abdominal Pain  . Nausea  . Emesis  . Urinary Retention     (Consider location/radiation/quality/duration/timing/severity/associated sxs/prior Treatment) Patient is a 33 y.o. female presenting with flank pain.  Flank Pain This is a recurrent problem. Episode onset: 2 days ago. The problem occurs constantly. The problem has been gradually worsening. Associated symptoms include abdominal pain. Pertinent negatives include no chest pain and no shortness of breath. Associated symptoms comments: Nausea, vomiting.  No fevers.  Suprapubic discomfort.  Urinary frequency.  Hematuria.  . Nothing aggravates the symptoms. Nothing relieves the symptoms.    Past Medical History  Diagnosis Date  . Migraines   . Pneumonia     04/2010  . Eye infection     within last 2 weeks  . Depression   . Complication of anesthesia     Difficult IV stick with Central line placement  . Intestinal obstruction   . Gastritis 03/2011    Per EGD  . Obesity   . Ovarian cyst, right     Hx of oophorectomy for adhesions  . Drug-seeking behavior   . Renal disorder     Kidney Stones  . UTI (lower urinary tract infection)    Past Surgical History  Procedure Laterality Date  . Tonsillectomy  1991  . Appendectomy  1992  . Cholecystectomy  2003  . Right ovary removed  2006  . Abdominal adhesion surgery  2006  . Knee arthroscopy  2001, 2010    left knee x 2  . Hernia repair      umbilical  . Nasal septoplasty w/ turbinoplasty  03/16/2011    Procedure: NASAL SEPTOPLASTY WITH TURBINATE REDUCTION;  Surgeon: Susy Frizzle, MD;  Location: [facility] OR;  Service: ENT;  Laterality: Bilateral;  . Laparoscopic gastric banding    . Removal of lap band    . Pilonidal cyst excision    . Esophagogastroduodenoscopy  04/05/2011    Procedure: ESOPHAGOGASTRODUODENOSCOPY  (EGD);  Surgeon: Charna Elizabeth, MD;  Location: [facility];  Service: Endoscopy;  Laterality: N/A;   No family history on file. History  Substance Use Topics  . Smoking status: Never Smoker   . Smokeless tobacco: Never Used  . Alcohol Use: Yes     Comment: rarely   OB History   Grav Para Term Preterm Abortions TAB SAB Ect Mult Living                 Review of Systems  Constitutional: Negative for fever.  HENT: Negative for congestion.   Respiratory: Negative for cough and shortness of breath.   Cardiovascular: Negative for chest pain.  Gastrointestinal: Positive for nausea, vomiting and abdominal pain. Negative for diarrhea.  Genitourinary: Positive for flank pain.  All other systems reviewed and are negative.      Allergies  Toradol; Compazine; Haloperidol and related; and Reglan  Home Medications   Current Outpatient Rx  Name  Route  Sig  Dispense  Refill  . ciprofloxacin (CIPRO) 250 MG tablet   Oral   Take 2 tablets (500 mg total) by mouth 2 (two) times daily. For 3 days   6 tablet   0   . DULoxetine (CYMBALTA) 60 MG capsule   Oral   Take 60 mg by mouth daily.           Marland Kitchen  ibuprofen (ADVIL,MOTRIN) 600 MG tablet   Oral   Take 1 tablet (600 mg total) by mouth every 8 (eight) hours as needed (for headache, do not use on empty stomach if possible).   20 tablet   0   . levonorgestrel (MIRENA) 20 MCG/24HR IUD   Intrauterine   1 each by Intrauterine route once. 2010         . QUEtiapine (SEROQUEL) 25 MG tablet   Oral   Take 75 mg by mouth at bedtime as needed. For migraines          BP 139/82  Pulse 115  Temp(Src) 98 F (36.7 C) (Oral)  Resp 24  Ht 5\' 2"  (1.575 m)  Wt 225 lb (102.059 kg)  BMI 41.14 kg/m2  SpO2 100% Physical Exam  Nursing note and vitals reviewed. Constitutional: She is oriented to person, place, and time. She appears well-developed and well-nourished. No distress.  HENT:  Head: Normocephalic and atraumatic.  Mouth/Throat:  Oropharynx is clear and moist.  Eyes: Conjunctivae are normal. Pupils are equal, round, and reactive to light. No scleral icterus.  Neck: Neck supple.  Cardiovascular: Regular rhythm, normal heart sounds and intact distal pulses.  Tachycardia present.   No murmur heard. Pulmonary/Chest: Effort normal and breath sounds normal. No stridor. No respiratory distress. She has no rales.  Abdominal: Soft. Bowel sounds are normal. She exhibits no distension. There is tenderness (mild) in the suprapubic area, left upper quadrant and left lower quadrant. There is CVA tenderness (left). There is no rigidity, no rebound and no guarding.  Musculoskeletal: Normal range of motion. She exhibits no edema.       Back:  Negative straight leg raise test on left.  Neurological: She is alert and oriented to person, place, and time.  Skin: Skin is warm and dry. No rash noted.  Psychiatric: She has a normal mood and affect. Her behavior is normal.    ED Course  Procedures (including critical care time)  Emergency Ultrasound Study:   Angiocath insertion Performed by: Blake Divine DAVID III  Consent: Verbal consent obtained. Risks and benefits: risks, benefits and alternatives were discussed Immediately prior to procedure the correct patient, procedure, equipment, support staff and site/side marked as needed.  Indication: difficult IV access Preparation: Patient was prepped and draped in the usual sterile fashion. Vein Location: right forearm vein was visualized during assessment for potential access sites and was found to be patent/ easily compressed with linear ultrasound.  The needle was visualized with real-time ultrasound and guided into the vein. Gauge: 20  Image saved and stored.  Normal blood return.  Patient tolerance: Patient tolerated the procedure well with no immediate complications.      Labs Review Labs Reviewed  COMPREHENSIVE METABOLIC PANEL - Abnormal; Notable for the following:     Glucose, Bld 160 (*)    All other components within normal limits  URINALYSIS, ROUTINE W REFLEX MICROSCOPIC - Abnormal; Notable for the following:    Color, Urine RED (*)    APPearance CLOUDY (*)    Hgb urine dipstick LARGE (*)    Protein, ur 100 (*)    Leukocytes, UA SMALL (*)    All other components within normal limits  LIPASE, BLOOD - Abnormal; Notable for the following:    Lipase 107 (*)    All other components within normal limits  CBC WITH DIFFERENTIAL  URINE MICROSCOPIC-ADD ON  POC URINE PREG, ED   Imaging Review US Abdomen Complete  07/13/2013   CLINICAL  DATA:  Abdominal pain.  Nausea.  Vomiting.  EXAM: ULTRASOUND ABDOMEN COMPLETE  COMPARISON:  CT, 07/15/2012  FINDINGS: Gallbladder:  Surgically absent  Common bile duct:  Diameter: 4.5 mm.  Liver:  No focal lesion identified. Within normal limits in parenchymal echogenicity.  IVC:  No abnormality visualized.  Pancreas:  Not visualized due to overlying bowel gas.  Spleen:  Size and appearance within normal limits.  Right Kidney:  Length: 11.5 cm. Echogenicity within normal limits. No mass or hydronephrosis visualized.  Left Kidney:  Length: 11.0 cm. Echogenicity within normal limits. No mass or hydronephrosis visualized.  Abdominal aorta:  Not well visualized, but no evidence of an aneurysm.  Other findings:  None.  IMPRESSION: 1. No acute findings. 2. Pancreas not visualized. 3. Status post cholecystectomy. 4. Normal kidneys.  No hydronephrosis.   Electronically Signed   By: Amie Portlandavid  Ormond M.D.   On: 07/13/2013 21:55     EKG Interpretation None      MDM   Final diagnoses:  Renal colic    33 year old female with a history of Kidney stones presenting with left flank pain. Pain is constant, worsening over the past few days. Denies fevers, but does endorse hematuria and urinary frequency.  Her labs show hematuria without bacteriuria.  Lipase mildly elevated but no epigastric pain or tenderness. Patient did not want CT imaging do to  multiple prior CT scans. Obtain ultrasound which did not show any acute findings.  She remained well-appearing and nontoxic during her ED course.  Pain and nausea controlled.  Will treat her for renal colic given her history of kidney stones.  Candyce ChurnJohn David Hong Moring III, MD 07/14/13 [phone]

## 2013-07-13 NOTE — ED Notes (Signed)
UTI symptoms x 1 week. N/V since last night. Left flank pain since last night. HX of UTI Denies fever

## 2013-07-13 NOTE — ED Notes (Signed)
MD at bedside.  ATTEMPTING PIV

## 2013-07-13 NOTE — ED Notes (Signed)
MD at bedside. 

## 2013-07-13 NOTE — Discharge Instructions (Signed)
Ureteral Colic (Kidney Stones) °Ureteral colic is the result of a condition when kidney stones form inside the kidney. Once kidney stones are formed they may move into the tube that connects the kidney with the bladder (ureter). If this occurs, this condition may cause pain (colic) in the ureter.  °CAUSES  °Pain is caused by stone movement in the ureter and the obstruction caused by the stone. °SYMPTOMS  °The pain comes and goes as the ureter contracts around the stone. The pain is usually intense, sharp, and stabbing in character. The location of the pain may move as the stone moves through the ureter. When the stone is near the kidney the pain is usually located in the back and radiates to the belly (abdomen). When the stone is ready to pass into the bladder the pain is often located in the lower abdomen on the side the stone is located. At this location, the symptoms may mimic those of a urinary tract infection with urinary frequency. Once the stone is located here it often passes into the bladder and the pain disappears completely. °TREATMENT  °· Your caregiver will provide you with medicine for pain relief. °· You may require specialized follow-up X-rays. °· The absence of pain does not always mean that the stone has passed. It may have just stopped moving. If the urine remains completely obstructed, it can cause loss of kidney function or even complete destruction of the involved kidney. It is your responsibility and in your interest that X-rays and follow-ups as suggested by your caregiver are completed. Relief of pain without passage of the stone can be associated with severe damage to the kidney, including loss of kidney function on that side. °· If your stone does not pass on its own, additional measures may be taken by your caregiver to ensure its removal. °HOME CARE INSTRUCTIONS  °· Increase your fluid intake. Water is the preferred fluid since juices containing vitamin C may acidify the urine making it  less likely for certain stones (uric acid stones) to pass. °· Strain all urine. A strainer will be provided. Keep all particulate matter or stones for your caregiver to inspect. °· Take your pain medicine as directed. °· Make a follow-up appointment with your caregiver as directed. °· Remember that the goal is passage of your stone. The absence of pain does not mean the stone is gone. Follow your caregiver's instructions. °· Only take over-the-counter or prescription medicines for pain, discomfort, or fever as directed by your caregiver. °SEEK MEDICAL CARE IF:  °· Pain cannot be controlled with the prescribed medicine. °· You have a fever. °· Pain continues for longer than your caregiver advises it should. °· There is a change in the pain, and you develop chest discomfort or constant abdominal pain. °· You feel faint or pass out. °MAKE SURE YOU:  °· Understand these instructions. °· Will watch your condition. °· Will get help right away if you are not doing well or get worse. °Document Released: 02/01/2005 Document Revised: 08/19/2012 Document Reviewed: 10/19/2010 °[employer]® Patient Information ©2014 [employer]. ° °

## 2013-07-13 NOTE — ED Notes (Signed)
Patient transported to Ultrasound at bedside 

## 2013-07-13 NOTE — ED Notes (Signed)
Family at bedside. 

## 2013-07-13 NOTE — ED Notes (Signed)
Several PIV attempted made Considered palliative/comfort care measures with pt/family/caregiver. Made aware. Charge to EDP. EDP will attempt to obtain access and blood on this pt

## 2013-07-13 NOTE — ED Notes (Signed)
Attempted to obtain urine sample pt was unable to get urine sample at this time. C/o of urinary retention

## 2013-12-29 ENCOUNTER — Emergency Department: Payer: Self-pay | Admitting: Emergency Medicine

## 2013-12-29 LAB — COMPREHENSIVE METABOLIC PANEL
ALK PHOS: 85 U/L
AST: 25 U/L (ref 15–37)
Albumin: 3.8 g/dL (ref 3.4–5.0)
Anion Gap: 10 (ref 7–16)
BILIRUBIN TOTAL: 0.4 mg/dL (ref 0.2–1.0)
BUN: 10 mg/dL (ref 7–18)
CO2: 25 mmol/L (ref 21–32)
Calcium, Total: 9 mg/dL (ref 8.5–10.1)
Chloride: 105 mmol/L (ref 98–107)
Creatinine: 0.71 mg/dL (ref 0.60–1.30)
EGFR (African American): >60
Glucose: 147 mg/dL — ABNORMAL HIGH (ref 65–99)
OSMOLALITY: 281 (ref 275–301)
Potassium: 3.8 mmol/L (ref 3.5–5.1)
SGPT (ALT): 30 U/L
SODIUM: 140 mmol/L (ref 136–145)
TOTAL PROTEIN: 7.9 g/dL (ref 6.4–8.2)

## 2013-12-29 LAB — CBC WITH DIFFERENTIAL/PLATELET
Basophil #: 0.1 10*3/uL (ref 0.0–0.1)
Basophil %: 1.2 %
EOS PCT: 1.7 %
Eosinophil #: 0.2 10*3/uL (ref 0.0–0.7)
HCT: 42.3 % (ref 35.0–47.0)
HGB: 13.8 g/dL (ref 12.0–16.0)
Lymphocyte #: 2.4 10*3/uL (ref 1.0–3.6)
Lymphocyte %: 25.7 %
MCH: 29.4 pg (ref 26.0–34.0)
MCHC: 32.7 g/dL (ref 32.0–36.0)
MCV: 90 fL (ref 80–100)
MONOS PCT: 6 %
Monocyte #: 0.6 x10 3/mm (ref 0.2–0.9)
NEUTROS PCT: 65.4 %
Neutrophil #: 6.1 10*3/uL (ref 1.4–6.5)
PLATELETS: 375 10*3/uL (ref 150–440)
RBC: 4.7 10*6/uL (ref 3.80–5.20)
RDW: 14.2 % (ref 11.5–14.5)
WBC: 9.3 10*3/uL (ref 3.6–11.0)

## 2013-12-29 LAB — URINALYSIS, COMPLETE
BILIRUBIN, UR: NEGATIVE
Glucose,UR: NEGATIVE mg/dL (ref 0–75)
KETONE: NEGATIVE
LEUKOCYTE ESTERASE: NEGATIVE
Nitrite: NEGATIVE
PH: 5 (ref 4.5–8.0)
RBC,UR: 4 /HPF (ref 0–5)
Specific Gravity: 1.015 (ref 1.003–1.030)
Squamous Epithelial: 9
WBC UR: 6 /HPF (ref 0–5)

## 2013-12-31 LAB — URINE CULTURE

## 2014-01-03 ENCOUNTER — Emergency Department: Payer: Self-pay | Admitting: Emergency Medicine

## 2014-01-03 LAB — CBC WITH DIFFERENTIAL/PLATELET
BASOS ABS: 0.1 10*3/uL (ref 0.0–0.1)
Basophil %: 0.9 %
EOS PCT: 2.9 %
Eosinophil #: 0.3 10*3/uL (ref 0.0–0.7)
HCT: 40.8 % (ref 35.0–47.0)
HGB: 13.8 g/dL (ref 12.0–16.0)
Lymphocyte #: 3.2 10*3/uL (ref 1.0–3.6)
Lymphocyte %: 32.6 %
MCH: 30.1 pg (ref 26.0–34.0)
MCHC: 33.8 g/dL (ref 32.0–36.0)
MCV: 89 fL (ref 80–100)
Monocyte #: 0.7 x10 3/mm (ref 0.2–0.9)
Monocyte %: 7.2 %
Neutrophil #: 5.5 10*3/uL (ref 1.4–6.5)
Neutrophil %: 56.4 %
Platelet: 357 10*3/uL (ref 150–440)
RBC: 4.57 10*6/uL (ref 3.80–5.20)
RDW: 14.1 % (ref 11.5–14.5)
WBC: 9.7 10*3/uL (ref 3.6–11.0)

## 2014-01-03 LAB — COMPREHENSIVE METABOLIC PANEL
ALK PHOS: 82 U/L
ALT: 30 U/L
AST: 30 U/L (ref 15–37)
Albumin: 3.3 g/dL — ABNORMAL LOW (ref 3.4–5.0)
Anion Gap: 8 (ref 7–16)
BILIRUBIN TOTAL: 0.3 mg/dL (ref 0.2–1.0)
BUN: 9 mg/dL (ref 7–18)
CALCIUM: 8.8 mg/dL (ref 8.5–10.1)
CHLORIDE: 108 mmol/L — AB (ref 98–107)
CO2: 23 mmol/L (ref 21–32)
CREATININE: 0.66 mg/dL (ref 0.60–1.30)
Glucose: 174 mg/dL — ABNORMAL HIGH (ref 65–99)
Osmolality: 280 (ref 275–301)
Potassium: 4.1 mmol/L (ref 3.5–5.1)
Sodium: 139 mmol/L (ref 136–145)
Total Protein: 7.2 g/dL (ref 6.4–8.2)

## 2014-04-13 ENCOUNTER — Encounter (HOSPITAL_COMMUNITY): Payer: Self-pay | Admitting: Emergency Medicine

## 2014-04-13 ENCOUNTER — Emergency Department (HOSPITAL_COMMUNITY): Payer: BC Managed Care – PPO

## 2014-04-13 ENCOUNTER — Emergency Department (HOSPITAL_COMMUNITY)
Admission: EM | Admit: 2014-04-13 | Discharge: 2014-04-13 | Disposition: A | Discharge status: Home / Self Care | Payer: BC Managed Care – PPO | Attending: Emergency Medicine | Admitting: Emergency Medicine

## 2014-04-13 DIAGNOSIS — Z87440 Personal history of urinary (tract) infections: Secondary | ICD-10-CM | POA: Diagnosis not present

## 2014-04-13 DIAGNOSIS — R1013 Epigastric pain: Secondary | ICD-10-CM | POA: Diagnosis not present

## 2014-04-13 DIAGNOSIS — F329 Major depressive disorder, single episode, unspecified: Secondary | ICD-10-CM | POA: Insufficient documentation

## 2014-04-13 DIAGNOSIS — Z79899 Other long term (current) drug therapy: Secondary | ICD-10-CM | POA: Insufficient documentation

## 2014-04-13 DIAGNOSIS — R112 Nausea with vomiting, unspecified: Secondary | ICD-10-CM | POA: Insufficient documentation

## 2014-04-13 DIAGNOSIS — Z87442 Personal history of urinary calculi: Secondary | ICD-10-CM | POA: Diagnosis not present

## 2014-04-13 DIAGNOSIS — Z9049 Acquired absence of other specified parts of digestive tract: Secondary | ICD-10-CM | POA: Insufficient documentation

## 2014-04-13 DIAGNOSIS — E669 Obesity, unspecified: Secondary | ICD-10-CM | POA: Insufficient documentation

## 2014-04-13 DIAGNOSIS — Z8742 Personal history of other diseases of the female genital tract: Secondary | ICD-10-CM | POA: Diagnosis not present

## 2014-04-13 DIAGNOSIS — R1011 Right upper quadrant pain: Secondary | ICD-10-CM | POA: Diagnosis not present

## 2014-04-13 DIAGNOSIS — Z8669 Personal history of other diseases of the nervous system and sense organs: Secondary | ICD-10-CM | POA: Diagnosis not present

## 2014-04-13 DIAGNOSIS — Z3202 Encounter for pregnancy test, result negative: Secondary | ICD-10-CM | POA: Diagnosis not present

## 2014-04-13 DIAGNOSIS — R509 Fever, unspecified: Secondary | ICD-10-CM | POA: Insufficient documentation

## 2014-04-13 DIAGNOSIS — Z8701 Personal history of pneumonia (recurrent): Secondary | ICD-10-CM | POA: Diagnosis not present

## 2014-04-13 DIAGNOSIS — Z8679 Personal history of other diseases of the circulatory system: Secondary | ICD-10-CM | POA: Diagnosis not present

## 2014-04-13 DIAGNOSIS — Z8719 Personal history of other diseases of the digestive system: Secondary | ICD-10-CM | POA: Diagnosis not present

## 2014-04-13 LAB — CBC WITH DIFFERENTIAL/PLATELET
BASOS ABS: 0 10*3/uL (ref 0.0–0.1)
Basophils Relative: 0 % (ref 0–1)
EOS ABS: 0.2 10*3/uL (ref 0.0–0.7)
Eosinophils Relative: 2 % (ref 0–5)
HEMATOCRIT: 40.6 % (ref 36.0–46.0)
HEMOGLOBIN: 13.4 g/dL (ref 12.0–15.0)
Lymphocytes Relative: 28 % (ref 12–46)
Lymphs Abs: 2.3 10*3/uL (ref 0.7–4.0)
MCH: 29 pg (ref 26.0–34.0)
MCHC: 33 g/dL (ref 30.0–36.0)
MCV: 87.9 fL (ref 78.0–100.0)
MONO ABS: 0.4 10*3/uL (ref 0.1–1.0)
MONOS PCT: 5 % (ref 3–12)
NEUTROS PCT: 65 % (ref 43–77)
Neutro Abs: 5.2 10*3/uL (ref 1.7–7.7)
Platelets: 364 10*3/uL (ref 150–400)
RBC: 4.62 MIL/uL (ref 3.87–5.11)
RDW: 13 % (ref 11.5–15.5)
WBC: 8.1 10*3/uL (ref 4.0–10.5)

## 2014-04-13 LAB — BASIC METABOLIC PANEL
ANION GAP: 18 — AB (ref 5–15)
BUN: 8 mg/dL (ref 6–23)
CO2: 19 mEq/L (ref 19–32)
Calcium: 9.5 mg/dL (ref 8.4–10.5)
Chloride: 103 mEq/L (ref 96–112)
Creatinine, Ser: 0.52 mg/dL (ref 0.50–1.10)
Glucose, Bld: 153 mg/dL — ABNORMAL HIGH (ref 70–99)
Potassium: 3.7 mEq/L (ref 3.7–5.3)
Sodium: 140 mEq/L (ref 137–147)

## 2014-04-13 LAB — PREGNANCY, URINE: Preg Test, Ur: NEGATIVE

## 2014-04-13 LAB — URINE MICROSCOPIC-ADD ON

## 2014-04-13 LAB — URINALYSIS, ROUTINE W REFLEX MICROSCOPIC
BILIRUBIN URINE: NEGATIVE
GLUCOSE, UA: NEGATIVE mg/dL
KETONES UR: NEGATIVE mg/dL
Nitrite: NEGATIVE
Protein, ur: NEGATIVE mg/dL
Specific Gravity, Urine: 1.019 (ref 1.005–1.030)
Urobilinogen, UA: 1 mg/dL (ref 0.0–1.0)
pH: 5.5 (ref 5.0–8.0)

## 2014-04-13 LAB — LIPASE, BLOOD: Lipase: 28 U/L (ref 11–59)

## 2014-04-13 MED ORDER — POLYETHYLENE GLYCOL 3350 17 GM/SCOOP PO POWD: 17.0000 g | Freq: Two times a day (BID) | ORAL | Status: DC

## 2014-04-13 MED ORDER — IOHEXOL 300 MG/ML  SOLN
100.0000 mL | Freq: Once | INTRAMUSCULAR | Status: AC | PRN
  Administered 2014-04-13: 100 mL via INTRAVENOUS

## 2014-04-13 MED ORDER — HYDROCODONE-ACETAMINOPHEN 5-325 MG PO TABS
2.0000 | ORAL_TABLET | Freq: Once | ORAL | Status: AC
  Administered 2014-04-13: 2 via ORAL
  Filled 2014-04-13: qty 2

## 2014-04-13 MED ORDER — PROMETHAZINE HCL 12.5 MG PO TABS: 12.5000 mg | ORAL_TABLET | Freq: Four times a day (QID) | ORAL | Status: DC | PRN

## 2014-04-13 MED ORDER — DIPHENHYDRAMINE HCL 50 MG/ML IJ SOLN
25.0000 mg | Freq: Once | INTRAMUSCULAR | Status: AC
  Administered 2014-04-13: 25 mg via INTRAVENOUS
  Filled 2014-04-13: qty 1

## 2014-04-13 MED ORDER — PROMETHAZINE HCL 25 MG PO TABS: 12.5000 mg | ORAL_TABLET | ORAL | Status: DC

## 2014-04-13 MED ORDER — HYDROMORPHONE HCL 1 MG/ML IJ SOLN
0.5000 mg | Freq: Once | INTRAMUSCULAR | Status: AC
  Administered 2014-04-13: 0.5 mg via INTRAVENOUS
  Filled 2014-04-13: qty 1

## 2014-04-13 MED ORDER — HYDROMORPHONE HCL 1 MG/ML IJ SOLN
1.0000 mg | Freq: Once | INTRAMUSCULAR | Status: AC
  Administered 2014-04-13: 1 mg via INTRAVENOUS
  Filled 2014-04-13: qty 1

## 2014-04-13 MED ORDER — IOHEXOL 300 MG/ML  SOLN
50.0000 mL | Freq: Once | INTRAMUSCULAR | Status: AC | PRN
  Administered 2014-04-13: 50 mL via ORAL

## 2014-04-13 MED ORDER — PROMETHAZINE HCL 25 MG/ML IJ SOLN
12.5000 mg | Freq: Once | INTRAMUSCULAR | Status: AC
  Administered 2014-04-13: 12.5 mg via INTRAVENOUS
  Filled 2014-04-13: qty 1

## 2014-04-13 MED ORDER — SODIUM CHLORIDE 0.9 % IV BOLUS (SEPSIS)
500.0000 mL | Freq: Once | INTRAVENOUS | Status: AC
  Administered 2014-04-13: 500 mL via INTRAVENOUS

## 2014-04-13 MED ORDER — HYDROCODONE-ACETAMINOPHEN 5-325 MG PO TABS: 1.0000 | ORAL_TABLET | ORAL | Status: DC | PRN

## 2014-04-13 NOTE — ED Notes (Signed)
Per pt, states abdominal pain with vomiting since yesterday-has not had BM in 5 days-history of SBO

## 2014-04-13 NOTE — ED Notes (Signed)
Patient transported to X-ray 

## 2014-04-13 NOTE — ED Notes (Signed)
Pt regurgitated on floor; reports feeling nauseated and remains in pain at same level

## 2014-04-13 NOTE — ED Notes (Signed)
Pt c/o chest pressure and itching with redness from areas observed scratched on upper chest area.  Pt very emotional and in pain from abdominal issues.  Pt placed on monitor and advised PA.  VS stable and WNL.

## 2014-04-13 NOTE — Discharge Instructions (Signed)
Abdominal Pain Many things can cause abdominal pain. Usually, abdominal pain is not caused by a disease and will improve without treatment. It can often be observed and treated at home. Your health care provider will do a physical exam and possibly order blood tests and X-rays to help determine the seriousness of your pain. However, in many cases, more time must pass before a clear cause of the pain can be found. Before that point, your health care provider may not know if you need more testing or further treatment. HOME CARE INSTRUCTIONS  Monitor your abdominal pain for any changes. The following actions may help to alleviate any discomfort you are experiencing:  Only take over-the-counter or prescription medicines as directed by your health care provider.  Do not take laxatives unless directed to do so by your health care provider.  Try a clear liquid diet (broth, tea, or water) as directed by your health care provider. Slowly move to a bland diet as tolerated. SEEK MEDICAL CARE IF:  You have unexplained abdominal pain.  You have abdominal pain associated with nausea or diarrhea.  You have pain when you urinate or have a bowel movement.  You experience abdominal pain that wakes you in the night.  You have abdominal pain that is worsened or improved by eating food.  You have abdominal pain that is worsened with eating fatty foods.  You have a fever. SEEK IMMEDIATE MEDICAL CARE IF:   Your pain does not go away within 2 hours.  You keep throwing up (vomiting).  Your pain is felt only in portions of the abdomen, such as the right side or the left lower portion of the abdomen.  You pass bloody or black tarry stools. MAKE SURE YOU:  Understand these instructions.   Will watch your condition.   Will get help right away if you are not doing well or get worse.  Document Released: 02/01/2005 Document Revised: 04/29/2013 Document Reviewed: 01/01/2013 [facility] Patient Information  2015 [place], [place]. This information is not intended to replace advice given to you by your health care provider. Make sure you discuss any questions you have with your health care provider. Nausea and Vomiting Nausea is a sick feeling that often comes before throwing up (vomiting). Vomiting is a reflex where stomach contents come out of your mouth. Vomiting can cause severe loss of body fluids (dehydration). Children and elderly adults can become dehydrated quickly, especially if they also have diarrhea. Nausea and vomiting are symptoms of a condition or disease. It is important to find the cause of your symptoms. CAUSES   Direct irritation of the stomach lining. This irritation can result from increased acid production (gastroesophageal reflux disease), infection, food poisoning, taking certain medicines (such as nonsteroidal anti-inflammatory drugs), alcohol use, or tobacco use.  Signals from the brain.These signals could be caused by a headache, heat exposure, an inner ear disturbance, increased pressure in the brain from injury, infection, a tumor, or a concussion, pain, emotional stimulus, or metabolic problems.  An obstruction in the gastrointestinal tract (bowel obstruction).  Illnesses such as diabetes, hepatitis, gallbladder problems, appendicitis, kidney problems, cancer, sepsis, atypical symptoms of a heart attack, or eating disorders.  Medical treatments such as chemotherapy and radiation.  Receiving medicine that makes you sleep (general anesthetic) during surgery. DIAGNOSIS Your caregiver may ask for tests to be done if the problems do not improve after a few days. Tests may also be done if symptoms are severe or if the reason for the nausea and  vomiting is not clear. Tests may include:  Urine tests.  Blood tests.  Stool tests.  Cultures (to look for evidence of infection).  X-rays or other imaging studies. Test results can help your caregiver make decisions about  treatment or the need for additional tests. TREATMENT You need to stay well hydrated. Drink frequently but in small amounts.You may wish to drink water, sports drinks, clear broth, or eat frozen ice pops or gelatin dessert to help stay hydrated.When you eat, eating slowly may help prevent nausea.There are also some antinausea medicines that may help prevent nausea. HOME CARE INSTRUCTIONS   Take all medicine as directed by your caregiver.  If you do not have an appetite, do not force yourself to eat. However, you must continue to drink fluids.  If you have an appetite, eat a normal diet unless your caregiver tells you differently.  Eat a variety of complex carbohydrates (rice, wheat, potatoes, bread), lean meats, yogurt, fruits, and vegetables.  Avoid high-fat foods because they are more difficult to digest.  Drink enough water and fluids to keep your urine clear or pale yellow.  If you are dehydrated, ask your caregiver for specific rehydration instructions. Signs of dehydration may include:  Severe thirst.  Dry lips and mouth.  Dizziness.  Dark urine.  Decreasing urine frequency and amount.  Confusion.  Rapid breathing or pulse. SEEK IMMEDIATE MEDICAL CARE IF:   You have blood or brown flecks (like coffee grounds) in your vomit.  You have black or bloody stools.  You have a severe headache or stiff neck.  You are confused.  You have severe abdominal pain.  You have chest pain or trouble breathing.  You do not urinate at least once every 8 hours.  You develop cold or clammy skin.  You continue to vomit for longer than 24 to 48 hours.  You have a fever. MAKE SURE YOU:   Understand these instructions.  Will watch your condition.  Will get help right away if you are not doing well or get worse. Document Released: 04/24/2005 Document Revised: 07/17/2011 Document Reviewed: 09/21/2010 [facility] Patient Information 2015 [place]. This information is not  intended to replace advice given to you by your health care provider. Make sure you discuss any questions you have with your health care provider. Constipation Constipation is when a person has fewer than three bowel movements a week, has difficulty having a bowel movement, or has stools that are dry, hard, or larger than normal. As people grow older, constipation is more common. If you try to fix constipation with medicines that make you have a bowel movement (laxatives), the problem may get worse. Long-term laxative use may cause the muscles of the colon to become weak. A low-fiber diet, not taking in enough fluids, and taking certain medicines may make constipation worse.  CAUSES   Certain medicines, such as antidepressants, pain medicine, iron supplements, antacids, and water pills.   Certain diseases, such as diabetes, irritable bowel syndrome (IBS), thyroid disease, or depression.   Not drinking enough water.   Not eating enough fiber-rich foods.   Stress or travel.   Lack of physical activity or exercise.   Ignoring the urge to have a bowel movement.   Using laxatives too much.  SIGNS AND SYMPTOMS   Having fewer than three bowel movements a week.   Straining to have a bowel movement.   Having stools that are hard, dry, or larger than normal.   Feeling full or bloated.  Pain in the lower abdomen.   Not feeling relief after having a bowel movement.  DIAGNOSIS  Your health care provider will take a medical history and perform a physical exam. Further testing may be done for severe constipation. Some tests may include:  A barium enema X-ray to examine your rectum, colon, and, sometimes, your small intestine.   A sigmoidoscopy to examine your lower colon.   A colonoscopy to examine your entire colon. TREATMENT  Treatment will depend on the severity of your constipation and what is causing it. Some dietary treatments include drinking more fluids and eating  more fiber-rich foods. Lifestyle treatments may include regular exercise. If these diet and lifestyle recommendations do not help, your health care provider may recommend taking over-the-counter laxative medicines to help you have bowel movements. Prescription medicines may be prescribed if over-the-counter medicines do not work.  HOME CARE INSTRUCTIONS   Eat foods that have a lot of fiber, such as fruits, vegetables, whole grains, and beans.  Limit foods high in fat and processed sugars, such as french fries, hamburgers, cookies, candies, and soda.   A fiber supplement may be added to your diet if you cannot get enough fiber from foods.   Drink enough fluids to keep your urine clear or pale yellow.   Exercise regularly or as directed by your health care provider.   Go to the restroom when you have the urge to go. Do not hold it.   Only take over-the-counter or prescription medicines as directed by your health care provider. Do not take other medicines for constipation without talking to your health care provider first.  SEEK IMMEDIATE MEDICAL CARE IF:   You have bright red blood in your stool.   Your constipation lasts for more than 4 days or gets worse.   You have abdominal or rectal pain.   You have thin, pencil-like stools.   You have unexplained weight loss. MAKE SURE YOU:   Understand these instructions.  Will watch your condition.  Will get help right away if you are not doing well or get worse. Document Released: 01/21/2004 Document Revised: 04/29/2013 Document Reviewed: 02/03/2013 [facility] Patient Information 2015 [place]. This information is not intended to replace advice given to you by your health care provider. Make sure you discuss any questions you have with your health care provider.

## 2014-04-13 NOTE — ED Notes (Addendum)
PA visited pt and talked to her ordered ECG and benedryl, both of which have been administered and performed; PA and Dr. Patria Maneampos viewed ECG and are at bedside at this time.  Pt in pain but calm at this time.

## 2014-04-13 NOTE — ED Notes (Signed)
Pt is aware that a urine sample is needed.  

## 2014-04-13 NOTE — ED Provider Notes (Signed)
CSN: [id number]     Arrival date & time 04/13/14  1305 History   First MD Initiated Contact with Patient 04/13/14 1528     Chief Complaint  Patient presents with  . possible SBO    Loretta Alexander is a 33 y.o. female with a history of small bowel obstruction presents to the ED complaining of nausea, vomiting and abdominal pain since yesterday and patient has not had a bowel movement last 5 days. Patient reports vomiting since yesterday morning associated with abdominal pain that is located in her right upper quadrant. Patient rates her pain at 7 out of 10 and is sharp and waxes and wanes. Patient has intermittent abdominal pain for the past week. Patient has a history of 2 small bowel obstructions one in 10/2013 and one in 11/2013. Patient also had 8 inches of small bowel removed in June 2015. Patient's previous abdominal surgeries include appendectomy, cholecystectomy, and right ovary removal, and previous gastric banding. Patient reports she has vomited 6 times today. Patient also reports hematuria for the past 3 days. Patient also reports intermittent fevers for the past 3 days and with a maximum temperature 102.7 yesterday. She also reports a generalized headache today. Patient denies hematochezia, dysuria, urinary frequency, sore throat, cough, wheezing, chest pain or shortness of breath.  (Consider location/radiation/quality/duration/timing/severity/associated sxs/prior Treatment) HPI  Past Medical History  Diagnosis Date  . Migraines   . Pneumonia     04/2010  . Eye infection     within last 2 weeks  . Depression   . Complication of anesthesia     Difficult IV stick with Central line placement  . Intestinal obstruction   . Gastritis 03/2011    Per EGD  . Obesity   . Ovarian cyst, right     Hx of oophorectomy for adhesions  . Drug-seeking behavior   . Renal disorder     Kidney Stones  . UTI (lower urinary tract infection)    Past Surgical History  Procedure Laterality Date  .  Tonsillectomy  1991  . Appendectomy  1992  . Cholecystectomy  2003  . Right ovary removed  2006  . Abdominal adhesion surgery  2006  . Knee arthroscopy  2001, 2010    left knee x 2  . Hernia repair      umbilical  . Nasal septoplasty w/ turbinoplasty  03/16/2011    Procedure: NASAL SEPTOPLASTY WITH TURBINATE REDUCTION;  Surgeon: Susy FrizzleJefry H Rosen, MD;  Location: [facility] OR;  Service: ENT;  Laterality: Bilateral;  . Laparoscopic gastric banding    . Removal of lap band    . Pilonidal cyst excision    . Esophagogastroduodenoscopy  04/05/2011    Procedure: ESOPHAGOGASTRODUODENOSCOPY (EGD);  Surgeon: Charna ElizabethJyothi Mann, MD;  Location: [facility] ENDOSCOPY;  Service: Endoscopy;  Laterality: N/A;   No family history on file. History  Substance Use Topics  . Smoking status: Never Smoker   . Smokeless tobacco: Never Used  . Alcohol Use: Yes     Comment: rarely   OB History    No data available     Review of Systems  Constitutional: Positive for fever. Negative for chills and appetite change.  HENT: Negative for congestion, ear pain, sore throat and trouble swallowing.   Eyes: Negative for pain and visual disturbance.  Respiratory: Negative for cough, shortness of breath and wheezing.   Cardiovascular: Negative for chest pain, palpitations and leg swelling.  Gastrointestinal: Positive for nausea, vomiting and abdominal pain. Negative for diarrhea and abdominal distention.  Genitourinary: Positive for hematuria. Negative for dysuria, urgency, frequency, flank pain, vaginal discharge and difficulty urinating.  Musculoskeletal: Negative for back pain and neck pain.  Skin: Negative for rash and wound.  Neurological: Negative for dizziness, weakness, numbness and headaches.  All other systems reviewed and are negative.     Allergies  Toradol; Compazine; Haloperidol and related; and Reglan  Home Medications   Prior to Admission medications   Medication Sig Start Date End Date Taking? Authorizing Provider   acetaminophen (TYLENOL) 500 MG tablet Take 1,000 mg by mouth every 6 (six) hours as needed for moderate pain or fever (pain & fever).    Yes Historical Provider, MD  DULoxetine (CYMBALTA) 60 MG capsule Take 60 mg by mouth daily.    Yes Historical Provider, MD  NON FORMULARY Take 1 capsule by mouth daily. Oil of Oregano Herbal Supplement.   Yes Historical Provider, MD  Probiotic Product (PROBIOTIC DAILY) CAPS Take 1 capsule by mouth daily.   Yes Historical Provider, MD  QUEtiapine (SEROQUEL) 25 MG tablet Take 75 mg by mouth at bedtime as needed (sleep or migraines).    Yes Historical Provider, MD  HYDROcodone-acetaminophen (NORCO/VICODIN) 5-325 MG per tablet Take 1 tablet by mouth every 4 (four) hours as needed for moderate pain or severe pain. 04/13/14   Lawana ChambersWilliam Duncan Deicy Rusk, PA-C  levonorgestrel (MIRENA) 20 MCG/24HR IUD 1 each by Intrauterine route once. 2010    Historical Provider, MD  oxyCODONE-acetaminophen (PERCOCET/ROXICET) 5-325 MG per tablet Take 1 tablet by mouth every 4 (four) hours as needed for severe pain. 07/13/13   Warnell Foresterrey Wofford, MD  polyethylene glycol powder (GLYCOLAX/MIRALAX) powder Take 17 g by mouth 2 (two) times daily. Until daily soft stools  OTC 04/13/14   Einar GipWilliam Duncan Huston Stonehocker, PA-C  promethazine (PHENERGAN) 12.5 MG tablet Take 1 tablet (12.5 mg total) by mouth every 6 (six) hours as needed for nausea or vomiting. 04/13/14   Einar GipWilliam Duncan Jolyn Deshmukh, PA-C   BP 116/70 mmHg  Pulse 82  Temp(Src) 98.5 F (36.9 C) (Oral)  Resp 18  SpO2 96% Physical Exam  Constitutional: She appears well-developed and well-nourished. No distress.  HENT:  Head: Normocephalic and atraumatic.  Mouth/Throat: Oropharynx is clear and moist. No oropharyngeal exudate.  Eyes: Conjunctivae are normal. Pupils are equal, round, and reactive to light. Right eye exhibits no discharge. Left eye exhibits no discharge.  Neck: Neck supple.  Cardiovascular: Normal rate, regular rhythm, normal heart sounds and  intact distal pulses.  Exam reveals no gallop and no friction rub.   No murmur heard. Pulmonary/Chest: Effort normal and breath sounds normal. No respiratory distress. She has no wheezes. She has no rales.  Abdominal: Soft. Bowel sounds are normal. She exhibits no distension and no mass. There is tenderness. There is no rebound and no guarding.  Patient's abdomen is soft. Bowel sounds are present. Patient has mild right upper quadrant tenderness to palpation. Patient is generally tender to palpation in her abdomen. Worse in her right upper quadrant.  Musculoskeletal: She exhibits no edema.  Lymphadenopathy:    She has no cervical adenopathy.  Neurological: She is alert. Coordination normal.  Skin: Skin is warm and dry. No rash noted. She is not diaphoretic. No erythema. No pallor.  Psychiatric: She has a normal mood and affect. Her behavior is normal.  Nursing note and vitals reviewed.   ED Course  Procedures (including critical care time) Labs Review Labs Reviewed  BASIC METABOLIC PANEL - Abnormal; Notable for the following:    Glucose,  Bld 153 (*)    Anion gap 18 (*)    All other components within normal limits  URINALYSIS, ROUTINE W REFLEX MICROSCOPIC - Abnormal; Notable for the following:    APPearance CLOUDY (*)    Hgb urine dipstick LARGE (*)    Leukocytes, UA SMALL (*)    All other components within normal limits  URINE MICROSCOPIC-ADD ON - Abnormal; Notable for the following:    Squamous Epithelial / LPF FEW (*)    Bacteria, UA FEW (*)    All other components within normal limits  URINE CULTURE  CBC WITH DIFFERENTIAL  LIPASE, BLOOD  PREGNANCY, URINE    Imaging Review Ct Abdomen Pelvis W Contrast  04/13/2014   CLINICAL DATA:  Per pt, states abdominal pain with vomiting since yesterday-has not had BM in 5 days-history of SBO. Hx of gastritis,obstructions, appendectomy, GB removed, hernia repair, and (2006) Rt ovary removed due to adhesions  EXAM: CT ABDOMEN AND PELVIS  WITH CONTRAST  TECHNIQUE: Multidetector CT imaging of the abdomen and pelvis was performed using the standard protocol following bolus administration of intravenous contrast.  CONTRAST:  50mL OMNIPAQUE IOHEXOL 300 MG/ML SOLN, OMNIPAQUE IOHEXOL 300 MG/ML SOLN  COMPARISON:  11/15/2013  FINDINGS: Clear lung bases.  Heart is normal in size.  Small lipoma projects within intrahepatic inferior vena cava, stable.  Gallbladder surgically absent.  No bile duct dilation.  Liver, spleen, pancreas, adrenal glands, kidneys, ureters, bladder: Unremarkable.  IUD well-positioned in the uterus.  No adnexal masses.  No adenopathy.  No abnormal fluid collections.  There is no bowel dilation to suggest obstruction. No bowel wall thickening or mesenteric inflammation is seen. Colonic stool burden is within normal limits. Bowel anastomosis staple line in the right mid to lower quadrant involving ileum, unchanged.  Anterior abdominal wall incision is noted, stable.  No hernia.  No significant bony abnormality.  IMPRESSION: 1. No acute findings. 2. No evidence of bowel obstruction. No adynamic ileus. No significant increased colonic stool. 3. Stable changes from previous surgery including a cholecystectomy and small bowel anastomosis.   Electronically Signed   By: Amie Portland M.D.   On: 04/13/2014 19:38   Dg Abd Acute W/chest  04/13/2014   CLINICAL DATA:  Abdominal pain. History of small bowel obstruction. Nausea. Constipation for 6 days.  EXAM: ACUTE ABDOMEN SERIES (ABDOMEN 2 VIEW & CHEST 1 VIEW)  COMPARISON:  06/17/2013.  CT, 11/15/2013.  FINDINGS: There is no bowel dilation to suggest obstruction or significant adynamic ileus. There are several nonspecific air-fluid levels on the erect view. There is no free air.  There changes from previous bowel surgery with a right mid abdomen bowel anastomosis staple line. Surgical clips are upper quadrant reflect a prior cholecystectomy. IUD projects in the central pelvis. No other soft  tissue abnormality.  Normal heart, mediastinum hila.  Clear lungs.  No significant bony abnormality.  IMPRESSION: 1. No evidence of obstruction or free air. Air are few nonspecific air-fluid levels within nondistended bowel. This could reflect gastroenteritis. No significant increased stool. 2. Normal frontal chest radiograph.   Electronically Signed   By: Amie Portland M.D.   On: 04/13/2014 18:00     EKG Interpretation   Date/Time:  Monday April 13 2014 18:03:44 EST Ventricular Rate:  90 PR Interval:  144 QRS Duration: 102 QT Interval:  402 QTC Calculation: 492 R Axis:   17 Text Interpretation:  Sinus rhythm Borderline prolonged QT interval No  significant change was found Confirmed by CAMPOS  MD, Caryn Bee ([id number]) on  04/13/2014 6:05:56 PM     Filed Vitals:   04/13/14 1327 04/13/14 1609 04/13/14 1810 04/13/14 2029  BP: 128/86 134/83 145/81 116/70  Pulse: 103 90 82 82  Temp: 98.2 F (36.8 C) 98.5 F (36.9 C)    TempSrc: Oral Oral    Resp: 18 18 16 18   SpO2: 99% 100% 100% 96%    MDM   Meds given in ED:  Medications  sodium chloride 0.9 % bolus 500 mL (0 mLs Intravenous Stopped 04/13/14 1849)  HYDROmorphone (DILAUDID) injection 0.5 mg (0.5 mg Intravenous Given 04/13/14 1740)  promethazine (PHENERGAN) injection 12.5 mg (12.5 mg Intravenous Given 04/13/14 1740)  diphenhydrAMINE (BENADRYL) injection 25 mg (25 mg Intravenous Given 04/13/14 1812)  HYDROmorphone (DILAUDID) injection 1 mg (1 mg Intravenous Given 04/13/14 1822)  iohexol (OMNIPAQUE) 300 MG/ML solution 100 mL (100 mLs Intravenous Contrast Given 04/13/14 1914)  iohexol (OMNIPAQUE) 300 MG/ML solution 50 mL (50 mLs Oral Contrast Given 04/13/14 1914)  HYDROcodone-acetaminophen (NORCO/VICODIN) 5-325 MG per tablet 2 tablet (2 tablets Oral Given 04/13/14 2042)    Discharge Medication List as of 04/13/2014  8:32 PM    START taking these medications   Details  HYDROcodone-acetaminophen (NORCO/VICODIN) 5-325 MG per tablet Take 1  tablet by mouth every 4 (four) hours as needed for moderate pain or severe pain., Starting 04/13/2014, Until Discontinued, Print    polyethylene glycol powder (GLYCOLAX/MIRALAX) powder Take 17 g by mouth 2 (two) times daily. Until daily soft stools  OTC, Starting 04/13/2014, Until Discontinued, Print        Final diagnoses:  Epigastric pain  Non-intractable vomiting with nausea, vomiting of unspecified type   Loretta Alexander is a 33 y.o. female with a history of small bowel obstruction presents to the ED complaining of nausea, vomiting and abdominal pain since yesterday and patient has not had a bowel movement last 5 days. Patient's previous abdominal surgeries include appendectomy, cholecystectomy, and right ovary removal, and previous gastric banding. Patient is afebrile and nontoxic appearing. Patient abdomen is soft with some mild right upper quadrant tenderness. Patient's urinalysis; only for few bacteria and is negative for nitrates. Will send for culture. The patient's CBC and BMP are unremarkable. The patient's lipase is 28. Patient had a CT abdomen and pelvis with contrast which was unremarkable for any acute findings. Patient was able to tolerate by mouth Norco and Phenergan prior to discharge. Patient is afebrile nontoxic appearing. Patient will be discharged with Norco and MiraLAX for constipation. Advised the patient to follow-up with her primary care provider this week. The patient is provided information on the on-call gastroenterologist to follow-up with. I advised patient to return to the emergency department with new or worsening symptoms or new concerns. Patient verbalized understanding and agreement with plan.   This patient was discussed with and evaluated by Dr. Patria Mane who agrees with assessment and plan.     Lawana Chambers, PA-C 04/14/14 6045  Lyanne Co, MD 04/14/14 1754

## 2014-04-15 LAB — URINE CULTURE
Colony Count: >=100000
Special Requests: NORMAL

## 2015-03-26 ENCOUNTER — Other Ambulatory Visit: Payer: Self-pay | Admitting: Physical Medicine and Rehabilitation

## 2015-03-26 DIAGNOSIS — G939 Disorder of brain, unspecified: Secondary | ICD-10-CM

## 2015-03-29 ENCOUNTER — Other Ambulatory Visit: Payer: Self-pay

## 2015-03-31 ENCOUNTER — Other Ambulatory Visit: Payer: Self-pay | Admitting: Neurological Surgery

## 2015-04-23 ENCOUNTER — Encounter (HOSPITAL_COMMUNITY)
Admission: RE | Admit: 2015-04-23 | Discharge: 2015-04-23 | Disposition: A | Discharge status: Home / Self Care | Payer: BLUE CROSS/BLUE SHIELD | Source: Ambulatory Visit | Attending: Neurological Surgery | Admitting: Neurological Surgery

## 2015-04-23 ENCOUNTER — Encounter (HOSPITAL_COMMUNITY): Payer: Self-pay

## 2015-04-23 DIAGNOSIS — K219 Gastro-esophageal reflux disease without esophagitis: Secondary | ICD-10-CM | POA: Diagnosis not present

## 2015-04-23 DIAGNOSIS — F329 Major depressive disorder, single episode, unspecified: Secondary | ICD-10-CM | POA: Insufficient documentation

## 2015-04-23 DIAGNOSIS — Z01818 Encounter for other preprocedural examination: Secondary | ICD-10-CM | POA: Diagnosis present

## 2015-04-23 DIAGNOSIS — M4802 Spinal stenosis, cervical region: Secondary | ICD-10-CM | POA: Insufficient documentation

## 2015-04-23 DIAGNOSIS — Z01812 Encounter for preprocedural laboratory examination: Secondary | ICD-10-CM | POA: Insufficient documentation

## 2015-04-23 DIAGNOSIS — Z9884 Bariatric surgery status: Secondary | ICD-10-CM | POA: Diagnosis not present

## 2015-04-23 DIAGNOSIS — Z79899 Other long term (current) drug therapy: Secondary | ICD-10-CM | POA: Diagnosis not present

## 2015-04-23 HISTORY — DX: Cardiac arrhythmia, unspecified: I49.9

## 2015-04-23 HISTORY — DX: Gastro-esophageal reflux disease without esophagitis: K21.9

## 2015-04-23 HISTORY — DX: Nontoxic single thyroid nodule: E04.1

## 2015-04-23 LAB — BASIC METABOLIC PANEL
Anion gap: 8 (ref 5–15)
BUN: 12 mg/dL (ref 6–20)
CALCIUM: 9.1 mg/dL (ref 8.9–10.3)
CO2: 22 mmol/L (ref 22–32)
Chloride: 108 mmol/L (ref 101–111)
Creatinine, Ser: 0.68 mg/dL (ref 0.44–1.00)
GFR calc Af Amer: >60 mL/min (ref >60)
GFR calc non Af Amer: >60 mL/min (ref >60)
GLUCOSE: 216 mg/dL — AB (ref 65–99)
Potassium: 3.8 mmol/L (ref 3.5–5.1)
Sodium: 138 mmol/L (ref 135–145)

## 2015-04-23 LAB — SURGICAL PCR SCREEN
MRSA, PCR: NEGATIVE
Staphylococcus aureus: POSITIVE — AB

## 2015-04-23 LAB — CBC
HCT: 38.7 % (ref 36.0–46.0)
Hemoglobin: 13.1 g/dL (ref 12.0–15.0)
MCH: 30.3 pg (ref 26.0–34.0)
MCHC: 33.9 g/dL (ref 30.0–36.0)
MCV: 89.6 fL (ref 78.0–100.0)
Platelets: 316 10*3/uL (ref 150–400)
RBC: 4.32 MIL/uL (ref 3.87–5.11)
RDW: 13 % (ref 11.5–15.5)
WBC: 6.9 10*3/uL (ref 4.0–10.5)

## 2015-04-23 NOTE — Pre-Procedure Instructions (Signed)
Loretta Alexander  04/23/2015      [id number] [place] - [address] AT [facility] [address] [place] [id number] Phone: [phone] Fax: [phone]  [id number] [place] [place], [place] - [name] [place] [address] Phone: [phone] Fax: [phone]    Your procedure is scheduled on     Wednesday  04/28/15  [facility].M.  Call this number if you have problems the morning of surgery:  [phone]   Remember:  Do not eat food or drink liquids after midnight.  Take these medicines the morning of surgery with A SIP OF WATER    DULOXETINE, HYDROCODONE IF NEEDED (STOP ASPIRIN, VITAMINS, FISH OIL, AND HERBAL MEDICATIONS.  DO NOT TAKE ANY NSAIDS  LIKE IBUPROFEN, ADVIL, NAPROXEN)   Do not wear jewelry, make-up or nail polish.  Do not wear lotions, powders, or perfumes.  You may wear deodorant.  Do not shave 48 hours prior to surgery.  Men may shave face and neck.  Do not bring valuables to the hospital.  [facility] is not responsible for any belongings or valuables.  Contacts, dentures or bridgework may not be worn into surgery.  Leave your suitcase in the car.  After surgery it may be brought to your room.  For patients admitted to the hospital, discharge time will be determined by your treatment team.  Patients discharged the day of surgery will not be allowed to drive home.   Name and phone number of your driver:    Special instructions:  [place] - Preparing for Surgery  Before surgery, you can play an important role.  Because skin is not sterile, your skin needs to be as free of germs as possible.  You can reduce the number of germs on you skin by washing with CHG (chlorahexidine gluconate) soap before surgery.  CHG is an antiseptic cleaner which kills germs and bonds with the skin to continue killing germs even after washing.  Please DO NOT  use if you have an allergy to CHG or antibacterial soaps.  If your skin becomes reddened/irritated stop using the CHG and inform your nurse when you arrive at Short Stay.  Do not shave (including legs and underarms) for at least 48 hours prior to the first CHG shower.  You may shave your face.  Please follow these instructions carefully:   1.  Shower with CHG Soap the night before surgery and the                                morning of Surgery.  2.  If you choose to wash your hair, wash your hair first as usual with your       normal shampoo.  3.  After you shampoo, rinse your hair and body thoroughly to remove the                      Shampoo.  4.  Use CHG as you would any other liquid soap.  You can apply chg directly       to the skin and wash gently with scrungie or a clean washcloth.  5.  Apply the CHG Soap to your body ONLY FROM THE NECK DOWN.  Do not use on open wounds or open sores.  Avoid contact with your eyes,       ears, mouth and genitals (private parts).  Wash genitals (private parts)       with your normal soap.  6.  Wash thoroughly, paying special attention to the area where your surgery        will be performed.  7.  Thoroughly rinse your body with warm water from the neck down.  8.  DO NOT shower/wash with your normal soap after using and rinsing off       the CHG Soap.  9.  Pat yourself dry with a clean towel.            10.  Wear clean pajamas.            11.  Place clean sheets on your bed the night of your first shower and do not        sleep with pets.  Day of Surgery  Do not apply any lotions/deoderants the morning of surgery.  Please wear clean clothes to the hospital/surgery center.    Please read over the following fact sheets that you were given. Pain Booklet, Coughing and Deep Breathing, MRSA Information and Surgical Site Infection Prevention

## 2015-04-26 ENCOUNTER — Encounter (HOSPITAL_COMMUNITY): Payer: Self-pay

## 2015-04-26 NOTE — Progress Notes (Signed)
Anesthesia Chart Review: Patient is a 34 year old female scheduled for C5-6, C6-7 ACDF on 04/28/15 by Dr. Yetta BarreJones.  History includes non-smoker, migraines, depression, right ovarian cyst s/p oopherectomy, nephrolithiasis, drug seeking behavior (listed in history), thyroid nodule (reports recently noted on images for c-spine disease with plans for thyroid U/S in the future--reported history of normal thyroid labs), GERD, tachycardia, tonsillectomy, appendectomy, nasal septoplasty, hernia repair, laparoscopic gastric banding s/p removal, cholecystectomy, small bowel obstruction s/p colon [place] ([place] [facility] [facility] (see Care Everywhere). BMI is consistent with morbid obesity. Patient is in nursing school.   For anesthesia history, she reported difficult IV stick (required CVL, PICC lines in the past) and tachycardia (up to 180-190 per patient) after colon resection. She reports increased blood loss and increased pain post-operatively. Tachycardia resolved with treatment of her pain. She denied getting referred to cardiology or cardiac testing with that incident. She does report a history of a normal echo > 3 years ago. She denied   PAT Vitals: HR 101, RR 20, BP 103/68, O2 sat 100%, T 36.9C. WT 108.455 kg. BMI 43.8.   Meds include Cymbalta, Norco, Mirena, Percocet, Seroquel, oil of oregano.  04/13/14 EKG: SR, borderline prolonged QT.  Preoperative labs noted. Non-fasting glucose 216. (She had had Cajun filet biscuit and sweet tea at [employer] 30 minutes before her appointment. Furthermore, she reports a POC A1C of 5.3 done just "two weeks ago" at her school health fair. She checked a home CBG today with her uncles meter and got a glucose of 101 after eating about 90 minutes prior. Last steroid taper was > 2 weeks ago. Has had intermittent elevated random glucose levels but says A1C has always been normal.). CBC WNL. Pregnancy test not done at  PAT. She reports she had both fallopian tubes removed and a right oopherectomy. Her uterus in intact. Has an IUD. If in fact she had both fallopian tubes removed than should not need a pregnancy test, however, I can't locate the actual Operative Report regarding the specifics of her GYN surgeries, so I will enter an order for a urine pregnancy test on arrival. She will also need a CBG.    I discussed with patient that she may have undiagnosed DM2 or at least glucose intolerance. She will need ongoing follow-up with her PCP. In addition, I told her that we would need to check a fasting CBG on the morning of surgery. With the information, she provided, I am not anticipating that it will be > 200, but if it is then it will be at the discretion of her anesthesiologist or surgeon if she can proceed as planned. I have discussed with anesthesiologist Dr. Aleene DavidsonE. Fitzgerald. If no significant tachycardia, will not plan to repeat EKG and if glucose is acceptable then would anticipate that she can proceed. I have also updated Erie NoeVanessa at Dr. Yetta BarreJones' office.  Velna Ochsllison Takesha Steger, PA-C [facility]/Anesthesiology Phone [phone] 04/26/2015 3:44 PM

## 2015-04-27 MED ORDER — CEFAZOLIN SODIUM-DEXTROSE 2-3 GM-% IV SOLR
2.0000 g | INTRAVENOUS | Status: AC
  Administered 2015-04-28: 2 g via INTRAVENOUS
  Filled 2015-04-27: qty 50

## 2015-04-28 ENCOUNTER — Encounter (HOSPITAL_COMMUNITY): Payer: Self-pay | Admitting: Neurological Surgery

## 2015-04-28 ENCOUNTER — Ambulatory Visit (HOSPITAL_COMMUNITY)
Admission: RE | Admit: 2015-04-28 | Discharge: 2015-04-29 | Disposition: A | Discharge status: Home / Self Care | Payer: BLUE CROSS/BLUE SHIELD | Source: Ambulatory Visit | Attending: Neurological Surgery | Admitting: Neurological Surgery

## 2015-04-28 ENCOUNTER — Encounter (HOSPITAL_COMMUNITY)
Admission: RE | Disposition: A | Discharge status: Home / Self Care | Payer: Self-pay | Source: Ambulatory Visit | Attending: Neurological Surgery

## 2015-04-28 ENCOUNTER — Ambulatory Visit (HOSPITAL_COMMUNITY): Payer: BLUE CROSS/BLUE SHIELD | Admitting: Anesthesiology

## 2015-04-28 ENCOUNTER — Ambulatory Visit (HOSPITAL_COMMUNITY): Payer: BLUE CROSS/BLUE SHIELD | Admitting: Vascular Surgery

## 2015-04-28 ENCOUNTER — Ambulatory Visit (HOSPITAL_COMMUNITY): Payer: BLUE CROSS/BLUE SHIELD

## 2015-04-28 DIAGNOSIS — M50222 Other cervical disc displacement at C5-C6 level: Secondary | ICD-10-CM | POA: Insufficient documentation

## 2015-04-28 DIAGNOSIS — Z981 Arthrodesis status: Secondary | ICD-10-CM

## 2015-04-28 DIAGNOSIS — Z419 Encounter for procedure for purposes other than remedying health state, unspecified: Secondary | ICD-10-CM

## 2015-04-28 DIAGNOSIS — M50223 Other cervical disc displacement at C6-C7 level: Secondary | ICD-10-CM | POA: Insufficient documentation

## 2015-04-28 DIAGNOSIS — K219 Gastro-esophageal reflux disease without esophagitis: Secondary | ICD-10-CM | POA: Diagnosis not present

## 2015-04-28 DIAGNOSIS — Z87442 Personal history of urinary calculi: Secondary | ICD-10-CM | POA: Insufficient documentation

## 2015-04-28 DIAGNOSIS — M4802 Spinal stenosis, cervical region: Secondary | ICD-10-CM | POA: Diagnosis present

## 2015-04-28 DIAGNOSIS — Z6841 Body Mass Index (BMI) 40.0 and over, adult: Secondary | ICD-10-CM | POA: Diagnosis not present

## 2015-04-28 DIAGNOSIS — G43909 Migraine, unspecified, not intractable, without status migrainosus: Secondary | ICD-10-CM | POA: Insufficient documentation

## 2015-04-28 DIAGNOSIS — M47812 Spondylosis without myelopathy or radiculopathy, cervical region: Secondary | ICD-10-CM | POA: Diagnosis not present

## 2015-04-28 DIAGNOSIS — F329 Major depressive disorder, single episode, unspecified: Secondary | ICD-10-CM | POA: Diagnosis not present

## 2015-04-28 HISTORY — PX: ANTERIOR CERVICAL DECOMP/DISCECTOMY FUSION: SHX1161

## 2015-04-28 LAB — GLUCOSE, CAPILLARY: GLUCOSE-CAPILLARY: 136 mg/dL — AB (ref 65–99)

## 2015-04-28 LAB — PREGNANCY, URINE: PREG TEST UR: NEGATIVE

## 2015-04-28 SURGERY — ANTERIOR CERVICAL DECOMPRESSION/DISCECTOMY FUSION 2 LEVELS
Anesthesia: General

## 2015-04-28 MED ORDER — SODIUM CHLORIDE 0.9 % IJ SOLN: 3.0000 mL | INTRAMUSCULAR | Status: DC | PRN

## 2015-04-28 MED ORDER — OXYCODONE HCL 5 MG/5ML PO SOLN: 5.0000 mg | Freq: Once | ORAL | Status: AC | PRN

## 2015-04-28 MED ORDER — DULOXETINE HCL 30 MG PO CPEP
60.0000 mg | ORAL_CAPSULE | Freq: Every day | ORAL | Status: DC
Start: 2015-04-29 — End: 2015-04-29

## 2015-04-28 MED ORDER — MORPHINE SULFATE (PF) 2 MG/ML IV SOLN
1.0000 mg | INTRAVENOUS | Status: DC | PRN
  Administered 2015-04-28: 2 mg via INTRAVENOUS
  Filled 2015-04-28: qty 1

## 2015-04-28 MED ORDER — DIPHENHYDRAMINE HCL 25 MG PO CAPS
25.0000 mg | ORAL_CAPSULE | Freq: Four times a day (QID) | ORAL | Status: DC | PRN
  Administered 2015-04-28: 25 mg via ORAL
  Filled 2015-04-28: qty 1

## 2015-04-28 MED ORDER — THROMBIN 5000 UNITS EX SOLR
OROMUCOSAL | Status: DC | PRN
  Administered 2015-04-28: 14:00:00 via TOPICAL

## 2015-04-28 MED ORDER — FENTANYL CITRATE (PF) 100 MCG/2ML IJ SOLN
INTRAMUSCULAR | Status: DC | PRN
  Administered 2015-04-28 (×2): 50 ug via INTRAVENOUS
  Administered 2015-04-28: 200 ug via INTRAVENOUS
  Administered 2015-04-28 (×3): 50 ug via INTRAVENOUS

## 2015-04-28 MED ORDER — OXYCODONE HCL 5 MG PO TABS
ORAL_TABLET | ORAL | Status: AC
  Filled 2015-04-28: qty 1

## 2015-04-28 MED ORDER — FENTANYL CITRATE (PF) 250 MCG/5ML IJ SOLN
INTRAMUSCULAR | Status: AC
  Filled 2015-04-28: qty 5

## 2015-04-28 MED ORDER — DEXAMETHASONE 4 MG PO TABS: 4.0000 mg | ORAL_TABLET | Freq: Four times a day (QID) | ORAL | Status: DC

## 2015-04-28 MED ORDER — MIDAZOLAM HCL 2 MG/2ML IJ SOLN
INTRAMUSCULAR | Status: AC
  Filled 2015-04-28: qty 2

## 2015-04-28 MED ORDER — MIDAZOLAM HCL 5 MG/5ML IJ SOLN
INTRAMUSCULAR | Status: DC | PRN
  Administered 2015-04-28: 2 mg via INTRAVENOUS

## 2015-04-28 MED ORDER — PHENOL 1.4 % MT LIQD: 1.0000 | OROMUCOSAL | Status: DC | PRN

## 2015-04-28 MED ORDER — OXYCODONE HCL 5 MG PO TABS
5.0000 mg | ORAL_TABLET | Freq: Once | ORAL | Status: AC | PRN
  Administered 2015-04-28: 5 mg via ORAL

## 2015-04-28 MED ORDER — PROMETHAZINE HCL 25 MG/ML IJ SOLN
12.5000 mg | Freq: Four times a day (QID) | INTRAMUSCULAR | Status: DC | PRN
  Administered 2015-04-28: 12.5 mg via INTRAMUSCULAR
  Filled 2015-04-28: qty 1

## 2015-04-28 MED ORDER — GLYCOPYRROLATE 0.2 MG/ML IJ SOLN
INTRAMUSCULAR | Status: AC
  Filled 2015-04-28: qty 3

## 2015-04-28 MED ORDER — POTASSIUM CHLORIDE IN NACL 20-0.9 MEQ/L-% IV SOLN
INTRAVENOUS | Status: DC
  Filled 2015-04-28 (×3): qty 1000

## 2015-04-28 MED ORDER — DEXAMETHASONE SODIUM PHOSPHATE 4 MG/ML IJ SOLN
4.0000 mg | Freq: Four times a day (QID) | INTRAMUSCULAR | Status: DC
  Administered 2015-04-28: 4 mg via INTRAVENOUS
  Filled 2015-04-28: qty 1

## 2015-04-28 MED ORDER — ONDANSETRON HCL 4 MG/2ML IJ SOLN
INTRAMUSCULAR | Status: AC
  Filled 2015-04-28: qty 2

## 2015-04-28 MED ORDER — METHOCARBAMOL 500 MG PO TABS
ORAL_TABLET | ORAL | Status: AC
  Filled 2015-04-28: qty 1

## 2015-04-28 MED ORDER — METHOCARBAMOL 500 MG PO TABS
500.0000 mg | ORAL_TABLET | Freq: Four times a day (QID) | ORAL | Status: DC | PRN
  Administered 2015-04-28 – 2015-04-29 (×2): 500 mg via ORAL
  Filled 2015-04-28: qty 1

## 2015-04-28 MED ORDER — HEMOSTATIC AGENTS (NO CHARGE) OPTIME
TOPICAL | Status: DC | PRN
  Administered 2015-04-28: 1 via TOPICAL

## 2015-04-28 MED ORDER — ACETAMINOPHEN 650 MG RE SUPP: 650.0000 mg | RECTAL | Status: DC | PRN

## 2015-04-28 MED ORDER — HYDROMORPHONE HCL 1 MG/ML IJ SOLN
INTRAMUSCULAR | Status: AC
  Administered 2015-04-28: 0.5 mg via INTRAVENOUS
  Filled 2015-04-28: qty 1

## 2015-04-28 MED ORDER — SODIUM CHLORIDE 0.9 % IR SOLN
Status: DC | PRN
  Administered 2015-04-28: 14:00:00

## 2015-04-28 MED ORDER — THROMBIN 5000 UNITS EX SOLR
CUTANEOUS | Status: DC | PRN
  Administered 2015-04-28 (×2): 5000 [IU] via TOPICAL

## 2015-04-28 MED ORDER — LACTATED RINGERS IV SOLN: INTRAVENOUS | Status: DC

## 2015-04-28 MED ORDER — NEOSTIGMINE METHYLSULFATE 10 MG/10ML IV SOLN
INTRAVENOUS | Status: AC
  Filled 2015-04-28: qty 1

## 2015-04-28 MED ORDER — SODIUM CHLORIDE 0.9 % IJ SOLN
3.0000 mL | Freq: Two times a day (BID) | INTRAMUSCULAR | Status: DC
  Administered 2015-04-28: 3 mL via INTRAVENOUS

## 2015-04-28 MED ORDER — ACETAMINOPHEN 325 MG PO TABS: 325.0000 mg | ORAL_TABLET | ORAL | Status: DC | PRN

## 2015-04-28 MED ORDER — OXYCODONE-ACETAMINOPHEN 5-325 MG PO TABS
1.0000 | ORAL_TABLET | ORAL | Status: DC | PRN
  Administered 2015-04-28 – 2015-04-29 (×3): 2 via ORAL
  Filled 2015-04-28 (×3): qty 2

## 2015-04-28 MED ORDER — LACTATED RINGERS IV SOLN
INTRAVENOUS | Status: DC | PRN
  Administered 2015-04-28 (×2): via INTRAVENOUS

## 2015-04-28 MED ORDER — SUGAMMADEX SODIUM 500 MG/5ML IV SOLN
INTRAVENOUS | Status: AC
  Filled 2015-04-28: qty 5

## 2015-04-28 MED ORDER — ROCURONIUM BROMIDE 50 MG/5ML IV SOLN
INTRAVENOUS | Status: AC
  Filled 2015-04-28: qty 1

## 2015-04-28 MED ORDER — 0.9 % SODIUM CHLORIDE (POUR BTL) OPTIME
TOPICAL | Status: DC | PRN
  Administered 2015-04-28: 1000 mL

## 2015-04-28 MED ORDER — METHOCARBAMOL 1000 MG/10ML IJ SOLN
500.0000 mg | Freq: Four times a day (QID) | INTRAVENOUS | Status: DC | PRN
  Filled 2015-04-28: qty 5

## 2015-04-28 MED ORDER — SUGAMMADEX SODIUM 200 MG/2ML IV SOLN
INTRAVENOUS | Status: DC | PRN
  Administered 2015-04-28: 500 mg via INTRAVENOUS

## 2015-04-28 MED ORDER — PROPOFOL 10 MG/ML IV BOLUS
INTRAVENOUS | Status: AC
  Filled 2015-04-28: qty 20

## 2015-04-28 MED ORDER — ROCURONIUM BROMIDE 100 MG/10ML IV SOLN
INTRAVENOUS | Status: DC | PRN
  Administered 2015-04-28: 50 mg via INTRAVENOUS
  Administered 2015-04-28: 20 mg via INTRAVENOUS

## 2015-04-28 MED ORDER — PROMETHAZINE HCL 25 MG PO TABS
12.5000 mg | ORAL_TABLET | Freq: Four times a day (QID) | ORAL | Status: DC | PRN
  Administered 2015-04-29: 12.5 mg via ORAL
  Filled 2015-04-28: qty 1

## 2015-04-28 MED ORDER — HYDROMORPHONE HCL 1 MG/ML IJ SOLN
0.2500 mg | INTRAMUSCULAR | Status: DC | PRN
  Administered 2015-04-28 (×4): 0.5 mg via INTRAVENOUS

## 2015-04-28 MED ORDER — ACETAMINOPHEN 160 MG/5ML PO SOLN: 325.0000 mg | ORAL | Status: DC | PRN

## 2015-04-28 MED ORDER — MENTHOL 3 MG MT LOZG: 1.0000 | LOZENGE | OROMUCOSAL | Status: DC | PRN

## 2015-04-28 MED ORDER — PROPOFOL 10 MG/ML IV BOLUS
INTRAVENOUS | Status: DC | PRN
  Administered 2015-04-28: 150 mg via INTRAVENOUS
  Administered 2015-04-28: 50 mg via INTRAVENOUS

## 2015-04-28 MED ORDER — CEFAZOLIN SODIUM 1-5 GM-% IV SOLN
1.0000 g | Freq: Three times a day (TID) | INTRAVENOUS | Status: AC
  Administered 2015-04-28 – 2015-04-29 (×2): 1 g via INTRAVENOUS
  Filled 2015-04-28 (×2): qty 50

## 2015-04-28 MED ORDER — BUPIVACAINE HCL (PF) 0.25 % IJ SOLN
INTRAMUSCULAR | Status: DC | PRN
  Administered 2015-04-28: 6 mL

## 2015-04-28 MED ORDER — HYDROMORPHONE HCL 1 MG/ML IJ SOLN
INTRAMUSCULAR | Status: AC
  Filled 2015-04-28: qty 1

## 2015-04-28 MED ORDER — ACETAMINOPHEN 325 MG PO TABS
650.0000 mg | ORAL_TABLET | ORAL | Status: DC | PRN
  Administered 2015-04-28: 650 mg via ORAL
  Filled 2015-04-28: qty 2

## 2015-04-28 SURGICAL SUPPLY — 44 items
BAG DECANTER FOR FLEXI CONT (MISCELLANEOUS) ×3 IMPLANT
BENZOIN TINCTURE PRP APPL 2/3 (GAUZE/BANDAGES/DRESSINGS) ×3 IMPLANT
BIT DRILL POWER (BIT) ×1 IMPLANT
BUR MATCHSTICK NEURO 3.0 LAGG (BURR) ×3 IMPLANT
CAGE COROENT SM 6X13X15 (Cage) ×6 IMPLANT
CANISTER SUCT 3000ML PPV (MISCELLANEOUS) ×3 IMPLANT
CLOSURE WOUND 1/2 X4 (GAUZE/BANDAGES/DRESSINGS) ×1
DRAPE C-ARM 42X72 X-RAY (DRAPES) ×6 IMPLANT
DRAPE LAPAROTOMY 100X72 PEDS (DRAPES) ×3 IMPLANT
DRAPE MICROSCOPE LEICA (MISCELLANEOUS) ×3 IMPLANT
DRAPE POUCH INSTRU U-SHP 10X18 (DRAPES) ×3 IMPLANT
DRILL BIT POWER (BIT) ×2
DRSG OPSITE POSTOP 3X4 (GAUZE/BANDAGES/DRESSINGS) ×3 IMPLANT
DURAPREP 6ML APPLICATOR 50/CS (WOUND CARE) ×3 IMPLANT
ELECT COATED BLADE 2.86 ST (ELECTRODE) ×3 IMPLANT
ELECT REM PT RETURN 9FT ADLT (ELECTROSURGICAL) ×3
ELECTRODE REM PT RTRN 9FT ADLT (ELECTROSURGICAL) ×1 IMPLANT
GAUZE SPONGE 4X4 16PLY XRAY LF (GAUZE/BANDAGES/DRESSINGS) IMPLANT
GLOVE BIO SURGEON STRL SZ8 (GLOVE) ×3 IMPLANT
GOWN STRL REUS W/ TWL LRG LVL3 (GOWN DISPOSABLE) IMPLANT
GOWN STRL REUS W/ TWL XL LVL3 (GOWN DISPOSABLE) ×1 IMPLANT
GOWN STRL REUS W/TWL 2XL LVL3 (GOWN DISPOSABLE) IMPLANT
GOWN STRL REUS W/TWL LRG LVL3 (GOWN DISPOSABLE)
GOWN STRL REUS W/TWL XL LVL3 (GOWN DISPOSABLE) ×2
HEMOSTAT POWDER KIT SURGIFOAM (HEMOSTASIS) IMPLANT
KIT BASIN OR (CUSTOM PROCEDURE TRAY) ×3 IMPLANT
KIT ROOM TURNOVER OR (KITS) ×3 IMPLANT
NEEDLE HYPO 25X1 1.5 SAFETY (NEEDLE) ×3 IMPLANT
NEEDLE SPNL 20GX3.5 QUINCKE YW (NEEDLE) ×3 IMPLANT
NS IRRIG 1000ML POUR BTL (IV SOLUTION) ×3 IMPLANT
PACK LAMINECTOMY NEURO (CUSTOM PROCEDURE TRAY) ×3 IMPLANT
PAD ARMBOARD 7.5X6 YLW CONV (MISCELLANEOUS) ×9 IMPLANT
PIN DISTRACTION 14MM (PIN) ×6 IMPLANT
PLATE ARCHON 2-LEVEL 38MM (Plate) ×3 IMPLANT
RUBBERBAND STERILE (MISCELLANEOUS) ×6 IMPLANT
SCREW ARCHON SELFTAP 4.0X13 (Screw) ×18 IMPLANT
SPONGE INTESTINAL PEANUT (DISPOSABLE) ×3 IMPLANT
SPONGE SURGIFOAM ABS GEL SZ50 (HEMOSTASIS) ×3 IMPLANT
STRIP CLOSURE SKIN 1/2X4 (GAUZE/BANDAGES/DRESSINGS) ×2 IMPLANT
SUT VIC AB 3-0 SH 8-18 (SUTURE) ×6 IMPLANT
TOWEL OR 17X24 6PK STRL BLUE (TOWEL DISPOSABLE) IMPLANT
TOWEL OR 17X26 10 PK STRL BLUE (TOWEL DISPOSABLE) ×3 IMPLANT
TRAP SPECIMEN MUCOUS 40CC (MISCELLANEOUS) ×3 IMPLANT
WATER STERILE IRR 1000ML POUR (IV SOLUTION) ×3 IMPLANT

## 2015-04-28 NOTE — Progress Notes (Signed)
[facility] attempted to start IV times 3  With out success. Neuro OR called,Dr. Maple HudsonMoser stated that they would start the IV  In Holding.

## 2015-04-28 NOTE — Op Note (Addendum)
04/28/2015  3:05 PM cervical spinal stenosis  PATIENT:  Loretta Alexander  34 y.o. female  PRE-OPERATIVE DIAGNOSIS:  Cervical spondylosis with cervical spinal stenosis C5-6, left C6-7 herniated nucleus pulposus with left arm pain  POST-OPERATIVE DIAGNOSIS:  Same  PROCEDURE:  1. Decompressive anterior cervical discectomy C5-6 C6-7, 2. Anterior cervical arthrodesis C5-C6 C6-7 utilizing peek interbody cages packed with morselized autograft, 3. Anterior cervical plating C5-6 C6-7 utilizing a nuvasive archon plate  SURGEON:  Marikay Alaravid Deardra Hinkley, MD  ASSISTANTS: Dr. Wynetta Emerycram  ANESTHESIA:   General  EBL: 20 ml  Total I/O In: 1000 [I.V.:1000] Out: 20 [Blood:20]  BLOOD ADMINISTERED:none  DRAINS: None   SPECIMEN:  No Specimen  INDICATION FOR PROCEDURE: This patient presented with neck and left arm pain. She tried medical management without relief. She had an MRI which showed significant spondylosis with stenosis at C5-6 with a leftward disc herniation at C6-7 compressing the left C7 nerve root. I recommended ACDF with plating at C5-6 and C6-7. Patient understood the risks, benefits, and alternatives and potential outcomes and wished to proceed.  PROCEDURE DETAILS: Patient was brought to the operating room placed under general endotracheal anesthesia. Patient was placed in the supine position on the operating room table. The neck was prepped with Duraprep and draped in a sterile fashion.   Three cc of local anesthesia was injected and a transverse incision was made on the right side of the neck.  Dissection was carried down thru the subcutaneous tissue and the platysma was  elevated, opened, and undermined with Metzenbaum scissors.  Dissection was then carried out thru an avascular plane leaving the sternocleidomastoid carotid artery and jugular vein laterally and the trachea and esophagus medially. The ventral aspect of the vertebral column was identified and a localizing x-ray was taken. The C5-6 level was  identified. The longus colli muscles were then elevated and the retractor was placed to expose C5-6 and C6-7. The annulus was incised and the disc space entered at both levels. Discectomy was performed with micro-curettes and pituitary rongeurs. I then used the high-speed drill to drill the endplates down to the level of the posterior longitudinal ligament. The drill shavings were saved in a mucous trap for later arthrodesis. The operating microscope was draped and brought into the field provided additional magnification, illumination and visualization. Discectomy was continued posteriorly thru the disc space. Posterior longitudinal ligament was opened with a nerve hook, and then removed along with disc herniation and osteophytes, decompressing the spinal canal and thecal sac. We then continued to remove osteophytic overgrowth and disc material decompressing the neural foramina and exiting nerve roots bilaterally at both levels. The scope was angled up and down to help decompress and undercut the vertebral bodies. Once the decompression was completed we could pass a nerve hook circumferentially to assure adequate decompression in the midline and in the neural foramina. So by both visualization and palpation we felt we had an adequate decompression of the neural elements. We then measured the height of the intravertebral disc space and selected a 6 mm millimeter Peek interbody cage packed with autograft and morcellized allograft. It was then gently positioned in the intravertebral disc space and countersunk. I then used a 38 mm plate and placed variable angle screws into the vertebral bodies and locked them into position. The wound was irrigated with bacitracin solution, checked for hemostasis which was established and confirmed. Once meticulous hemostasis was achieved, we then proceeded with closure. The platysma was closed with interrupted 3-0 undyed Vicryl suture,  the subcuticular layer was closed with interrupted  3-0 undyed Vicryl suture. The skin edges were approximated with steristrips. The drapes were removed. A sterile dressing was applied. The patient was then awakened from general anesthesia and transferred to the recovery room in stable condition. At the end of the procedure all sponge, needle and instrument counts were correct.   PLAN OF CARE: Admit for overnight observation  PATIENT DISPOSITION:  PACU - hemodynamically stable.   Delay start of Pharmacological VTE agent (>24hrs) due to surgical blood loss or risk of bleeding:  yes

## 2015-04-28 NOTE — Anesthesia Preprocedure Evaluation (Signed)
Anesthesia Evaluation  Patient identified by MRN, date of birth, ID band Patient awake    Reviewed: Allergy & Precautions, NPO status , Patient's Chart, lab work & pertinent test results  History of Anesthesia Complications Negative for: history of anesthetic complications  Airway Mallampati: III  TM Distance: >3 FB Neck ROM: Full    Dental  (+) Teeth Intact   Pulmonary neg shortness of breath, neg sleep apnea, neg COPD, neg recent URI, neg PE   breath sounds clear to auscultation       Cardiovascular negative cardio ROS   Rhythm:Regular     Neuro/Psych  Headaches, PSYCHIATRIC DISORDERS Depression    GI/Hepatic Neg liver ROS, GERD  Controlled,  Endo/Other  Morbid obesity  Renal/GU      Musculoskeletal negative musculoskeletal ROS (+)   Abdominal   Peds  Hematology negative hematology ROS (+)   Anesthesia Other Findings   Reproductive/Obstetrics                             Anesthesia Physical Anesthesia Plan  ASA: II  Anesthesia Plan: General   Post-op Pain Management:    Induction: Intravenous  Airway Management Planned: Oral ETT  Additional Equipment: None  Intra-op Plan:   Post-operative Plan: Extubation in OR  Informed Consent: I have reviewed the patients History and Physical, chart, labs and discussed the procedure including the risks, benefits and alternatives for the proposed anesthesia with the patient or authorized representative who has indicated his/her understanding and acceptance.   Dental advisory given  Plan Discussed with: CRNA and Surgeon  Anesthesia Plan Comments:         Anesthesia Quick Evaluation

## 2015-04-28 NOTE — Progress Notes (Signed)
Patient called staff to the room to informed staff she has redness on her chest and she's also itching from decadron medication that was given. RN gave patient benadryl to help with the itching.

## 2015-04-28 NOTE — Anesthesia Procedure Notes (Signed)
Procedure Name: Intubation Date/Time: 04/28/2015 1:16 PM Performed by: Reine JustFLOWERS, Dervin Vore T Pre-anesthesia Checklist: Patient identified, Emergency Drugs available, Suction available, Patient being monitored and Timeout performed Patient Re-evaluated:Patient Re-evaluated prior to inductionOxygen Delivery Method: Circle system utilized and Simple face mask Preoxygenation: Pre-oxygenation with 100% oxygen Intubation Type: IV induction Ventilation: Mask ventilation without difficulty and Oral airway inserted - appropriate to patient size Laryngoscope Size: Mac and 3 Grade View: Grade I Tube type: Oral Tube size: 7.5 mm Number of attempts: 1 Airway Equipment and Method: Patient positioned with wedge pillow and Stylet Placement Confirmation: ETT inserted through vocal cords under direct vision,  positive ETCO2 and breath sounds checked- equal and bilateral Secured at: 21 cm Tube secured with: Tape Dental Injury: Teeth and Oropharynx as per pre-operative assessment

## 2015-04-28 NOTE — Transfer of Care (Signed)
Immediate Anesthesia Transfer of Care Note  Patient: Loretta Alexander  Procedure(s) Performed: Procedure(s) with comments: Cervical five-six, Cervical six - seven Anterior cervical decompression/diskectomy/fusion (N/A) - C5-6 C6-7 Anterior cervical decompression/diskectomy/fusion  Patient Location: PACU  Anesthesia Type:General  Level of Consciousness: awake and alert   Airway & Oxygen Therapy: Patient Spontanous Breathing and Patient connected to nasal cannula oxygen  Post-op Assessment: Report given to RN, Post -op Vital signs reviewed and stable and Patient moving all extremities X 4  Post vital signs: Reviewed and stable  Last Vitals:  Filed Vitals:   04/28/15 0949 04/28/15 1515  BP: 103/56   Pulse: 95 144  Temp: 36.8 C 37.2 C  Resp: 20 27    Complications: No apparent anesthesia complications

## 2015-04-28 NOTE — H&P (Signed)
Subjective:   Patient is a 34 y.o. female admitted for ACDF. The patient first presented to me with complaints of neck pain and arm pain. Onset of symptoms was several months ago. The pain is described as aching and occurs all day. The pain is rated severe, and is located in the neck with radiation to left arm. The symptoms have been progressive. Symptoms are exacerbated by extending head backwards, and are relieved by none.  Previous work up includes MRI of cervical spine, results: disc bulge at C5-C6 and C6-C7 left.  Past Medical History  Diagnosis Date  . Migraines   . Pneumonia     04/2010  . Eye infection     within last 2 weeks  . Depression   . Intestinal obstruction (HCC)   . Gastritis 03/2011    Per EGD  . Obesity   . Ovarian cyst, right     Hx of oophorectomy for adhesions  . Drug-seeking behavior   . Renal disorder     Kidney Stones  . UTI (lower urinary tract infection)   . Dysrhythmia     INCREASED HR AT TIMES  . Thyroid nodule   . GERD (gastroesophageal reflux disease)     OCC TUMS  . Complication of anesthesia     Difficult IV stick with Central line placement, TACHY 180-190 S/P COLON SURGERY (resolved with treatment for pain)     Past Surgical History  Procedure Laterality Date  . Tonsillectomy  1991  . Appendectomy  1992  . Cholecystectomy  2003  . Right ovary removed  2006  . Abdominal adhesion surgery  2006    BIL FALLOPIAN TUBES REMOVED  . Knee arthroscopy  2001, 2010    left knee x 2  . Hernia repair      umbilical  . Nasal septoplasty w/ turbinoplasty  03/16/2011    Procedure: NASAL SEPTOPLASTY WITH TURBINATE REDUCTION;  Surgeon: Susy Frizzle, MD;  Location: [facility] OR;  Service: ENT;  Laterality: Bilateral;  . Laparoscopic gastric banding    . Pilonidal cyst excision    . Esophagogastroduodenoscopy  04/05/2011    Procedure: ESOPHAGOGASTRODUODENOSCOPY (EGD);  Surgeon: Charna Elizabeth, MD;  Location: [facility] ENDOSCOPY;  Service: Endoscopy;  Laterality: N/A;  .  Removal of lap band    . Colon surgery      MAY 2015     BOWEL OBST s/p partial bowel resection [facility])    Allergies  Allergen Reactions  . Prochlorperazine Maleate Other (See Comments)    Patient can tolerate phenergan Major anxiety attack  . Compazine [Prochlorperazine Maleate] Anxiety and Other (See Comments)    dizziness  . Haloperidol And Related Anxiety and Other (See Comments)    dizziness  . Ondansetron Itching  . Reglan [Metoclopramide Hcl] Anxiety and Other (See Comments)    dizziness  . Toradol [Ketorolac Tromethamine] Nausea And Vomiting    Social History  Substance Use Topics  . Smoking status: Never Smoker   . Smokeless tobacco: Never Used  . Alcohol Use: Yes     Comment: rarely    History reviewed. No pertinent family history. Prior to Admission medications   Medication Sig Start Date End Date Taking? Authorizing Provider  acetaminophen (TYLENOL) 500 MG tablet Take 1,000 mg by mouth every 6 (six) hours as needed for moderate pain or fever (pain & fever).    Yes Historical Provider, MD  DULoxetine (CYMBALTA) 60 MG capsule Take 60 mg by mouth daily.    Yes Historical Provider, MD  levonorgestrel (MIRENA) 20 MCG/24HR IUD 1 each by Intrauterine route once. 2010   Yes Historical Provider, MD  NON FORMULARY Take 1 capsule by mouth daily. Oil of Oregano Herbal Supplement.   Yes Historical Provider, MD  oxyCODONE-acetaminophen (PERCOCET/ROXICET) 5-325 MG per tablet Take 1 tablet by mouth every 4 (four) hours as needed for severe pain. 07/13/13  Yes Blake DivineJohn Wofford, MD  Probiotic Product (PROBIOTIC DAILY) CAPS Take 1 capsule by mouth daily.   Yes Historical Provider, MD  QUEtiapine (SEROQUEL) 25 MG tablet Take 75 mg by mouth at bedtime as needed (sleep or migraines).    Yes Historical Provider, MD  HYDROcodone-acetaminophen (NORCO/VICODIN) 5-325 MG per tablet Take 1 tablet by mouth every 4 (four) hours as needed for moderate pain or severe pain. Patient not taking:  Reported on 04/22/2015 04/13/14   Everlene FarrierWilliam Dansie, PA-C  polyethylene glycol powder (GLYCOLAX/MIRALAX) powder Take 17 g by mouth 2 (two) times daily. Until daily soft stools  OTC Patient not taking: Reported on 04/22/2015 04/13/14   Everlene FarrierWilliam Dansie, PA-C  promethazine (PHENERGAN) 12.5 MG tablet Take 1 tablet (12.5 mg total) by mouth every 6 (six) hours as needed for nausea or vomiting. Patient not taking: Reported on 04/22/2015 04/13/14   Everlene FarrierWilliam Dansie, PA-C     Review of Systems  Positive ROS: neg  All other systems have been reviewed and were otherwise negative with the exception of those mentioned in the HPI and as above.  Objective: Vital signs in last 24 hours: Temp:  [98.3 F (36.8 C)] 98.3 F (36.8 C) (12/21 0949) Pulse Rate:  [95] 95 (12/21 0949) Resp:  [20] 20 (12/21 0949) BP: (103)/(56) 103/56 mmHg (12/21 0949) SpO2:  [100 %] 100 % (12/21 0949) Weight:  [108.41 kg (239 lb)] 108.41 kg (239 lb) (12/21 0949)  General Appearance: Alert, cooperative, no distress, appears stated age Head: Normocephalic, without obvious abnormality, atraumatic Eyes: PERRL, conjunctiva/corneas clear, EOM's intact      Neck: Supple, symmetrical, trachea midline, Back: Symmetric, no curvature, ROM normal, no CVA tenderness Lungs:  respirations unlabored Heart: Regular rate and rhythm Abdomen: Soft, non-tender Extremities: Extremities normal, atraumatic, no cyanosis or edema Pulses: 2+ and symmetric all extremities Skin: Skin color, texture, turgor normal, no rashes or lesions  NEUROLOGIC:  Mental status: Alert and oriented x4, no aphasia, good attention span, fund of knowledge and memory  Motor Exam - grossly normal Sensory Exam - grossly normal Reflexes: 1+ Coordination - grossly normal Gait - grossly normal Balance - grossly normal Cranial Nerves: I: smell Not tested  II: visual acuity  OS: nl    OD: nl  II: visual fields Full to confrontation  II: pupils Equal, round, reactive to  light  III,VII: ptosis None  III,IV,VI: extraocular muscles  Full ROM  V: mastication Normal  V: facial light touch sensation  Normal  V,VII: corneal reflex  Present  VII: facial muscle function - upper  Normal  VII: facial muscle function - lower Normal  VIII: hearing Not tested  IX: soft palate elevation  Normal  IX,X: gag reflex Present  XI: trapezius strength  5/5  XI: sternocleidomastoid strength 5/5  XI: neck flexion strength  5/5  XII: tongue strength  Normal    Data Review Lab Results  Component Value Date   WBC 6.9 04/23/2015   HGB 13.1 04/23/2015   HCT 38.7 04/23/2015   MCV 89.6 04/23/2015   PLT 316 04/23/2015   Lab Results  Component Value Date   NA 138 04/23/2015  K 3.8 04/23/2015   CL 108 04/23/2015   CO2 22 04/23/2015   BUN 12 04/23/2015   CREATININE 0.68 04/23/2015   GLUCOSE 216* 04/23/2015   Lab Results  Component Value Date   INR 0.94 04/03/2011    Assessment:   Cervical neck pain with herniated nucleus pulposus/ spondylosis/ stenosis at C5-6 C6-7. Patient has failed conservative therapy. Planned surgery : ACDF C5-6 C6-7  Plan:   I explained the condition and procedure to the patient and answered any questions.  Patient wishes to proceed with procedure as planned. Understands risks/ benefits/ and expected or typical outcomes.  Shirleymae Hauth S 04/28/2015 10:25 AM

## 2015-04-29 ENCOUNTER — Encounter (HOSPITAL_COMMUNITY): Payer: Self-pay | Admitting: Neurological Surgery

## 2015-04-29 DIAGNOSIS — M4802 Spinal stenosis, cervical region: Secondary | ICD-10-CM | POA: Diagnosis not present

## 2015-04-29 MED ORDER — METHOCARBAMOL 500 MG PO TABS: 500.0000 mg | ORAL_TABLET | Freq: Four times a day (QID) | ORAL | Status: DC | PRN

## 2015-04-29 MED ORDER — OXYCODONE-ACETAMINOPHEN 5-325 MG PO TABS: 1.0000 | ORAL_TABLET | Freq: Four times a day (QID) | ORAL | Status: DC | PRN

## 2015-04-29 NOTE — Discharge Instructions (Signed)
Wound Care °Keep incision covered and dry for one week.  If you shower prior to then, cover incision with plastic wrap.  °You may remove outer bandage after one week and shower.  °Do not put any creams, lotions, or ointments on incision. °Leave steri-strips on neck.  They will fall off by themselves. °Activity °Walk each and every day, increasing distance each day. °No lifting greater than 5 lbs.  Avoid excessive neck motion. °No driving for 2 weeks; may ride as a passenger locally. °Wear neck brace at all times except when showering or otherwise instructed. °Diet °Resume your normal diet.  °Return to Work °Will be discussed at you follow up appointment. °Call Your Doctor If Any of These Occur °Redness, drainage, or swelling at the wound.  °Temperature greater than 101 degrees. °Severe pain not relieved by pain medication. °Increased difficulty swallowing.  °Incision starts to come apart. °Follow Up Appt °Call today for appointment in 1-2 weeks ([phone]) or for problems.  If you have any hardware placed in your spine, you will need an x-ray before your appointment. ° °Anterior Cervical Diskectomy and Fusion °Anterior cervical diskectomy and fusion is a surgery that is done on the neck (cervical spine) to take pressure off of the nerves or the spinal cord. It is performed through the front (anterior) part of the neck. During this surgery, the damaged disk that is causing pain, numbness, or weakness is removed. The area where the disk was removed is filled with a plastic spacer implant, a bone graft, or both. These implants and bone grafts take pressure off of the nerves and spinal cord by making more room for the nerves to leave the spine. °LET YOUR HEALTH CARE PROVIDER KNOW ABOUT: °· Any allergies you have. °· All medicines you are taking, including vitamins, herbs, eye drops, creams, and over-the-counter medicines. °· Previous problems you or members of your family have had with the use of anesthetics. °· Any  blood disorders you have. °· Previous surgeries you have had. °· Any medical conditions you may have. °RISKS AND COMPLICATIONS °Generally, this is a safe procedure. However, problems may occur, including: °· Infection. °· Bleeding with the possible need for blood transfusion. °· Injury to surrounding structures, including nerves. °· Leakage of fluid from the brain or spinal cord (cerebrospinal fluid). °· Blood clots. °· Temporary breathing difficulties after surgery. °BEFORE THE PROCEDURE °· Follow your health care provider's instructions about eating or drinking restrictions. °· Ask your health care provider about: °¨ Changing or stopping your regular medicines. This is especially important if you are taking diabetes medicines or blood thinners. °¨ Taking medicines such as aspirin and ibuprofen. These medicines can thin your blood. Do not take these medicines before your procedure if your health care provider instructs you not to. °· You may be given antibiotic medicines to help prevent infection. °· Your incision site may be marked on your neck. °PROCEDURE °· An IV tube will be inserted into one of your veins. °· You will be given one or more of the following: °¨ A medicine that helps you relax (sedative). °¨ A medicine that makes you fall asleep (general anesthetic). °· A breathing tube will be placed. °· Your neck will be cleaned with a germ-killing solution (antiseptic). °· Your surgeon will make an incision on the front of your neck, usually within a skinfold line. °· Your neck muscles will be spread apart, and the damaged disk and bone spurs will be removed. °· The area where the disk   was removed will be filled with a small plastic spacer implant, a bone graft, or both. °· Your surgeon may put metal plates and screws (hardware) in your neck. This helps to stabilize the surgical site and keep implants and bone grafts in place. The hardware reduces motion at the surgical site so your bones can grow together  (fuse). This provides extra support to your neck. °· The incision will be closed with stitches (sutures). °· A bandage (dressing) will be applied to cover the incision. °The procedure may vary among health care providers and hospitals. °AFTER THE PROCEDURE °· Your blood pressure, heart rate, breathing rate, and blood oxygen level will be monitored often until the medicines you were given have worn off.  °· You will be monitored for any signs of complications from the procedure, such as: °¨ Too much bleeding from the incision site. °¨ A buildup of blood under your skin at the surgical site. °¨ Difficulty breathing. °· You may continue to receive antibiotics. °· You can start to eat as soon as you feel comfortable. °· You may be given a neck brace to wear after surgery. This brace limits your neck movement while your bones are fusing together. Follow your health care provider's instructions about how often and how long you need to wear this. °  °This information is not intended to replace advice given to you by your health care provider. Make sure you discuss any questions you have with your health care provider. °  °Document Released: 04/12/2009 Document Revised: 05/15/2014 Document Reviewed: 04/12/2009 °Elsevier Interactive Patient Education ©2016 [employer] ° °

## 2015-04-29 NOTE — Progress Notes (Signed)
Pt doing well. Pt and mother given D/C instructions with Rx's, verbal understanding was provided. Pt's IV was removed prior to D/C. Pt's incision is clean and dry with no sign of infection. Pt D/C'd home via walking @ 0940 per MD order. Pt is stable @ D/C and has no other needs at this time. Rema FendtAshley Yassir Enis, RN

## 2015-04-29 NOTE — Discharge Summary (Signed)
Physician Discharge Summary  Patient ID: Loretta MinorsStacy K Garner MRN: [id number] DOB/AGE: 34/12/1980 34 y.o.  Admit date: 04/28/2015 Discharge date: 04/29/2015  Admission Diagnoses: Cervical spondylosis with stenosis and disc herniation    Discharge Diagnoses: Same   Discharged Condition: good  Hospital Course: The patient was admitted on 04/28/2015 and taken to the operating room where the patient underwent ACDF C5-6 C6-7. The patient tolerated the procedure well and was taken to the recovery room and then to the floor in stable condition. The hospital course was routine. There were no complications. The wound remained clean dry and intact. Pt had appropriate neck soreness. No complaints of arm pain or new N/T/W. The patient remained afebrile with stable vital signs, and tolerated a regular diet. The patient continued to increase activities, and pain was well controlled with oral pain medications.   Consults: None  Significant Diagnostic Studies:  Results for orders placed or performed during the hospital encounter of 04/28/15  Pregnancy, urine  Result Value Ref Range   Preg Test, Ur NEGATIVE NEGATIVE  Glucose, capillary  Result Value Ref Range   Glucose-Capillary 136 (H) 65 - 99 mg/dL    Dg Cervical Spine 2-3 Views  04/28/2015  CLINICAL DATA:  C5-C6 and C6-C7 anterior fusion EXAM: DG C-ARM 61-120 MIN; CERVICAL SPINE - 2-3 VIEW COMPARISON:  None. FINDINGS: Two portable views of the cervical spine submitted. There is endotracheal tube in place. There is anterior fusion with metallic plate and screws at C5-C6 and C6-C7 level. There is anatomic alignment. IMPRESSION: Anterior metallic fusion at C5-C6 and C6-C7 level. There is anatomic alignment. Fluoroscopy time was 16 seconds.  Please see the operative report. Electronically Signed   By: Natasha MeadLiviu  Pop M.D.   On: 04/28/2015 15:03   Dg C-arm 1-60 Min  04/28/2015  CLINICAL DATA:  C5-C6 and C6-C7 anterior fusion EXAM: DG C-ARM 61-120 MIN; CERVICAL  SPINE - 2-3 VIEW COMPARISON:  None. FINDINGS: Two portable views of the cervical spine submitted. There is endotracheal tube in place. There is anterior fusion with metallic plate and screws at C5-C6 and C6-C7 level. There is anatomic alignment. IMPRESSION: Anterior metallic fusion at C5-C6 and C6-C7 level. There is anatomic alignment. Fluoroscopy time was 16 seconds.  Please see the operative report. Electronically Signed   By: Natasha MeadLiviu  Pop M.D.   On: 04/28/2015 15:03    Antibiotics:  Anti-infectives    Start     Dose/Rate Route Frequency Ordered Stop   04/28/15 2030  ceFAZolin (ANCEF) IVPB 1 g/50 mL premix     1 g 100 mL/hr over 30 Minutes Intravenous Every 8 hours 04/28/15 1719 04/29/15 0413   04/28/15 1356  bacitracin 50,000 Units in sodium chloride irrigation 0.9 % 500 mL irrigation  Status:  Discontinued       As needed 04/28/15 1356 04/28/15 1513   04/28/15 1115  ceFAZolin (ANCEF) IVPB 2 g/50 mL premix     2 g 100 mL/hr over 30 Minutes Intravenous To [facility] 04/27/15 0725 04/28/15 1325      Discharge Exam: Blood pressure 107/63, pulse 85, temperature 98.4 F (36.9 C), temperature source Oral, resp. rate 18, weight 108.41 kg (239 lb), last menstrual period 03/08/2015, SpO2 97 %. Neurologic: Grossly normal Dressing dry  Discharge Medications:     Medication List    STOP taking these medications        HYDROcodone-acetaminophen 5-325 MG tablet  Commonly known as:  NORCO/VICODIN      TAKE these medications  acetaminophen 500 MG tablet  Commonly known as:  TYLENOL  Take 1,000 mg by mouth every 6 (six) hours as needed for moderate pain or fever (pain & fever).     DULoxetine 60 MG capsule  Commonly known as:  CYMBALTA  Take 60 mg by mouth daily.     levonorgestrel 20 MCG/24HR IUD  Commonly known as:  MIRENA  1 each by Intrauterine route once. 2010     methocarbamol 500 MG tablet  Commonly known as:  ROBAXIN  Take 1 tablet (500 mg total) by mouth every  6 (six) hours as needed for muscle spasms.     NON FORMULARY  Take 1 capsule by mouth daily. Oil of Oregano Herbal Supplement.     oxyCODONE-acetaminophen 5-325 MG tablet  Commonly known as:  PERCOCET/ROXICET  Take 1-2 tablets by mouth every 6 (six) hours as needed for severe pain.     polyethylene glycol powder powder  Commonly known as:  GLYCOLAX/MIRALAX  Take 17 g by mouth 2 (two) times daily. Until daily soft stools  OTC     PROBIOTIC DAILY Caps  Take 1 capsule by mouth daily.     promethazine 12.5 MG tablet  Commonly known as:  PHENERGAN  Take 1 tablet (12.5 mg total) by mouth every 6 (six) hours as needed for nausea or vomiting.     QUEtiapine 25 MG tablet  Commonly known as:  SEROQUEL  Take 75 mg by mouth at bedtime as needed (sleep or migraines).        Disposition: Home   Final Dx: ACDF and plate Z6-1 W9-6      Discharge Instructions     Remove dressing in 72 hours    Complete by:  As directed      Call MD for:  difficulty breathing, headache or visual disturbances    Complete by:  As directed      Call MD for:  persistant nausea and vomiting    Complete by:  As directed      Call MD for:  redness, tenderness, or signs of infection (pain, swelling, redness, odor or green/yellow discharge around incision site)    Complete by:  As directed      Call MD for:  severe uncontrolled pain    Complete by:  As directed      Call MD for:  temperature >100.4    Complete by:  As directed      Diet - low sodium heart healthy    Complete by:  As directed      Increase activity slowly    Complete by:  As directed      Suggamadex Discharge Instructions    Complete by:  As directed   During your recent anesthetic, you were given the medication sugammadex (Bridion). This medication interacts with hormonal forms of birth control (oral contraceptives and injected or implanted birth control) and may make them ineffective. IF YOU USE ANY HORMONAL FORM OF BIRTH CONTROL, YOU MUST  USE AN ADDITIONAL BARRIER BIRTH CONTROL FOR METHOD FOR SEVEN DAYS after receiving sugammadex (Bridion) or there is a chance you could become pregnant.           Follow-up Information    Follow up with Jovanne Riggenbach S, MD. Schedule an appointment as soon as possible for a visit in 3 weeks.   Specialty:  Neurosurgery   Contact information:   [address] [place] [address] [phone]        Signed:  Rhayne Chatwin S 04/29/2015, 8:52 AM

## 2015-05-02 NOTE — Anesthesia Postprocedure Evaluation (Signed)
Anesthesia Post Note  Patient: Guerry MinorsStacy K Garner  Procedure(s) Performed: Procedure(s) (LRB): Cervical five-six, Cervical six - seven Anterior cervical decompression/diskectomy/fusion (N/A)  Patient location during evaluation: PACU Anesthesia Type: General Level of consciousness: awake Pain management: pain level controlled Vital Signs Assessment: post-procedure vital signs reviewed and stable Respiratory status: spontaneous breathing Cardiovascular status: stable Postop Assessment: no signs of nausea or vomiting Anesthetic complications: no    Last Vitals:  Filed Vitals:   04/29/15 0400 04/29/15 0753  BP: 109/72 107/63  Pulse: 88 85  Temp: 37 C 36.9 C  Resp: 20 18    Last Pain:  Filed Vitals:   04/29/15 0943  PainSc: 5                  Jesse Hirst

## 2015-05-08 ENCOUNTER — Inpatient Hospital Stay (HOSPITAL_COMMUNITY)
Admission: EM | Admit: 2015-05-08 | Discharge: 2015-05-13 | DRG: 392 | Disposition: A | Discharge status: Home / Self Care | Payer: BLUE CROSS/BLUE SHIELD | Attending: Neurological Surgery | Admitting: Neurological Surgery

## 2015-05-08 ENCOUNTER — Encounter (HOSPITAL_COMMUNITY): Payer: Self-pay

## 2015-05-08 DIAGNOSIS — Z87442 Personal history of urinary calculi: Secondary | ICD-10-CM

## 2015-05-08 DIAGNOSIS — R1310 Dysphagia, unspecified: Secondary | ICD-10-CM | POA: Diagnosis not present

## 2015-05-08 DIAGNOSIS — Z79899 Other long term (current) drug therapy: Secondary | ICD-10-CM

## 2015-05-08 DIAGNOSIS — E669 Obesity, unspecified: Secondary | ICD-10-CM | POA: Diagnosis present

## 2015-05-08 DIAGNOSIS — E860 Dehydration: Secondary | ICD-10-CM | POA: Diagnosis present

## 2015-05-08 DIAGNOSIS — G8918 Other acute postprocedural pain: Secondary | ICD-10-CM | POA: Diagnosis not present

## 2015-05-08 DIAGNOSIS — Z9884 Bariatric surgery status: Secondary | ICD-10-CM

## 2015-05-08 DIAGNOSIS — Z9889 Other specified postprocedural states: Secondary | ICD-10-CM

## 2015-05-08 DIAGNOSIS — R221 Localized swelling, mass and lump, neck: Secondary | ICD-10-CM | POA: Diagnosis present

## 2015-05-08 DIAGNOSIS — F419 Anxiety disorder, unspecified: Secondary | ICD-10-CM | POA: Diagnosis present

## 2015-05-08 DIAGNOSIS — R0682 Tachypnea, not elsewhere classified: Secondary | ICD-10-CM | POA: Diagnosis present

## 2015-05-08 DIAGNOSIS — K219 Gastro-esophageal reflux disease without esophagitis: Secondary | ICD-10-CM | POA: Diagnosis present

## 2015-05-08 DIAGNOSIS — Z981 Arthrodesis status: Secondary | ICD-10-CM

## 2015-05-08 DIAGNOSIS — Z90721 Acquired absence of ovaries, unilateral: Secondary | ICD-10-CM

## 2015-05-08 DIAGNOSIS — Z9049 Acquired absence of other specified parts of digestive tract: Secondary | ICD-10-CM

## 2015-05-08 DIAGNOSIS — Z9109 Other allergy status, other than to drugs and biological substances: Secondary | ICD-10-CM

## 2015-05-08 LAB — BASIC METABOLIC PANEL
Anion gap: 12 (ref 5–15)
BUN: 11 mg/dL (ref 6–20)
CHLORIDE: 107 mmol/L (ref 101–111)
CO2: 22 mmol/L (ref 22–32)
Calcium: 9.1 mg/dL (ref 8.9–10.3)
Creatinine, Ser: 0.54 mg/dL (ref 0.44–1.00)
GFR calc Af Amer: >60 mL/min (ref >60)
GFR calc non Af Amer: >60 mL/min (ref >60)
GLUCOSE: 177 mg/dL — AB (ref 65–99)
POTASSIUM: 3.9 mmol/L (ref 3.5–5.1)
Sodium: 141 mmol/L (ref 135–145)

## 2015-05-08 LAB — CBC WITH DIFFERENTIAL/PLATELET
BASOS PCT: 0 %
Basophils Absolute: 0 10*3/uL (ref 0.0–0.1)
EOS ABS: 0.2 10*3/uL (ref 0.0–0.7)
EOS PCT: 2 %
HEMATOCRIT: 39.1 % (ref 36.0–46.0)
HEMOGLOBIN: 13.4 g/dL (ref 12.0–15.0)
Lymphocytes Relative: 26 %
Lymphs Abs: 2.4 10*3/uL (ref 0.7–4.0)
MCH: 30.5 pg (ref 26.0–34.0)
MCHC: 34.3 g/dL (ref 30.0–36.0)
MCV: 88.9 fL (ref 78.0–100.0)
MONO ABS: 0.6 10*3/uL (ref 0.1–1.0)
MONOS PCT: 6 %
Neutro Abs: 6 10*3/uL (ref 1.7–7.7)
Neutrophils Relative %: 66 %
Platelets: 412 10*3/uL — ABNORMAL HIGH (ref 150–400)
RBC: 4.4 MIL/uL (ref 3.87–5.11)
RDW: 12.7 % (ref 11.5–15.5)
WBC: 9.2 10*3/uL (ref 4.0–10.5)

## 2015-05-08 MED ORDER — HYDROMORPHONE HCL 1 MG/ML IJ SOLN
1.0000 mg | INTRAMUSCULAR | Status: DC | PRN
  Administered 2015-05-08 (×4): 1 mg via INTRAVENOUS
  Administered 2015-05-08 (×2): 2 mg via INTRAVENOUS
  Administered 2015-05-09 – 2015-05-10 (×15): 1 mg via INTRAVENOUS
  Administered 2015-05-10: 2 mg via INTRAVENOUS
  Administered 2015-05-10 – 2015-05-11 (×9): 1 mg via INTRAVENOUS
  Administered 2015-05-11 (×2): 2 mg via INTRAVENOUS
  Administered 2015-05-11 (×2): 1 mg via INTRAVENOUS
  Administered 2015-05-11 (×2): 2 mg via INTRAVENOUS
  Administered 2015-05-11: 1 mg via INTRAVENOUS
  Administered 2015-05-12 (×4): 2 mg via INTRAVENOUS
  Filled 2015-05-08 (×2): qty 1
  Filled 2015-05-08: qty 2
  Filled 2015-05-08: qty 1
  Filled 2015-05-08: qty 2
  Filled 2015-05-08 (×2): qty 1
  Filled 2015-05-08: qty 2
  Filled 2015-05-08 (×6): qty 1
  Filled 2015-05-08: qty 2
  Filled 2015-05-08 (×7): qty 1
  Filled 2015-05-08: qty 2
  Filled 2015-05-08: qty 1
  Filled 2015-05-08: qty 2
  Filled 2015-05-08 (×6): qty 1
  Filled 2015-05-08 (×3): qty 2
  Filled 2015-05-08 (×4): qty 1
  Filled 2015-05-08: qty 2
  Filled 2015-05-08 (×2): qty 1
  Filled 2015-05-08: qty 2
  Filled 2015-05-08: qty 1

## 2015-05-08 MED ORDER — OXYCODONE-ACETAMINOPHEN 5-325 MG PO TABS
1.0000 | ORAL_TABLET | ORAL | Status: DC | PRN
  Filled 2015-05-08: qty 2

## 2015-05-08 MED ORDER — DULOXETINE HCL 60 MG PO CPEP
60.0000 mg | ORAL_CAPSULE | Freq: Every day | ORAL | Status: DC
  Administered 2015-05-13: 60 mg via ORAL
  Filled 2015-05-08 (×4): qty 1

## 2015-05-08 MED ORDER — DIPHENHYDRAMINE HCL 50 MG/ML IJ SOLN
25.0000 mg | Freq: Once | INTRAMUSCULAR | Status: AC
  Administered 2015-05-08: 25 mg via INTRAVENOUS
  Filled 2015-05-08: qty 1

## 2015-05-08 MED ORDER — HYDROMORPHONE HCL 1 MG/ML IJ SOLN
1.0000 mg | INTRAMUSCULAR | Status: DC | PRN
  Administered 2015-05-08: 1 mg via INTRAVENOUS
  Filled 2015-05-08: qty 1

## 2015-05-08 MED ORDER — DIPHENHYDRAMINE HCL 50 MG/ML IJ SOLN
25.0000 mg | Freq: Four times a day (QID) | INTRAMUSCULAR | Status: DC | PRN
  Administered 2015-05-08 – 2015-05-12 (×12): 25 mg via INTRAVENOUS
  Filled 2015-05-08 (×11): qty 1

## 2015-05-08 MED ORDER — HYDROMORPHONE HCL 1 MG/ML IJ SOLN
1.0000 mg | Freq: Once | INTRAMUSCULAR | Status: AC
  Administered 2015-05-08: 1 mg via INTRAVENOUS
  Filled 2015-05-08: qty 1

## 2015-05-08 MED ORDER — DIPHENHYDRAMINE HCL 25 MG PO CAPS
25.0000 mg | ORAL_CAPSULE | Freq: Four times a day (QID) | ORAL | Status: DC | PRN
  Filled 2015-05-08: qty 1

## 2015-05-08 MED ORDER — SODIUM CHLORIDE 0.9 % IV SOLN
INTRAVENOUS | Status: AC
  Administered 2015-05-08: 04:00:00 via INTRAVENOUS

## 2015-05-08 MED ORDER — PROMETHAZINE HCL 25 MG/ML IJ SOLN
25.0000 mg | Freq: Once | INTRAMUSCULAR | Status: AC
  Administered 2015-05-08: 25 mg via INTRAVENOUS
  Filled 2015-05-08: qty 1

## 2015-05-08 MED ORDER — POLYETHYLENE GLYCOL 3350 17 G PO PACK
17.0000 g | PACK | Freq: Every day | ORAL | Status: DC
  Filled 2015-05-08 (×2): qty 1

## 2015-05-08 MED ORDER — CYCLOBENZAPRINE HCL 10 MG PO TABS
10.0000 mg | ORAL_TABLET | Freq: Three times a day (TID) | ORAL | Status: DC | PRN
  Filled 2015-05-08 (×2): qty 1

## 2015-05-08 MED ORDER — PROMETHAZINE HCL 25 MG/ML IJ SOLN
25.0000 mg | Freq: Four times a day (QID) | INTRAMUSCULAR | Status: DC | PRN
  Administered 2015-05-08 – 2015-05-12 (×17): 25 mg via INTRAVENOUS
  Filled 2015-05-08 (×18): qty 1

## 2015-05-08 MED ORDER — DIPHENHYDRAMINE HCL 50 MG/ML IJ SOLN
INTRAMUSCULAR | Status: AC
  Filled 2015-05-08: qty 1

## 2015-05-08 NOTE — H&P (Signed)
Subjective: The patient is a 34 year old white female on whom Dr. Marikay Alar performed a C5-6 and C6-7 anterior cervical discectomy, fusion and plating on 04/28/2015. The patient called the office in the late afternoon yesterday and complained of dysphagia and dyspnea. She was advised to go to the ER. The patient went to the [facility] ER and was evaluated by Dr Denyce Robert. The evaluation included a cervical CT. This demonstrated some prevertebral swelling. At that time her temperature was normal and her white count was minimally elevated. Her white count is normal here. I discussed situation with the ER doctor. We discussed starting her on steroids and discharging her. The patient refused steroids because she developed a rash while she was hospitalized on 04/28/2015 and given steroids. The patient was transferred to [facility] and subsequently admitted.  Presently the patient is alert and pleasant. She is accompanied by her husband. She complains of neck pain, dysphagia and intermittent episodes of tachypnea/shortness of breath. She says she's had fevers at home as high as 102. She says she has difficulty swallowing her medications.   Past Medical History  Diagnosis Date  . Migraines   . Pneumonia     04/2010  . Eye infection     within last 2 weeks  . Depression   . Intestinal obstruction (HCC)   . Gastritis 03/2011    Per EGD  . Obesity   . Ovarian cyst, right     Hx of oophorectomy for adhesions  . Drug-seeking behavior   . Renal disorder     Kidney Stones  . UTI (lower urinary tract infection)   . Dysrhythmia     INCREASED HR AT TIMES  . Thyroid nodule   . GERD (gastroesophageal reflux disease)     OCC TUMS  . Complication of anesthesia     Difficult IV stick with Central line placement, TACHY 180-190 S/P COLON SURGERY (resolved with treatment for pain)     Past Surgical History  Procedure Laterality Date  . Tonsillectomy  1991  . Appendectomy  1992  . Cholecystectomy   2003  . Right ovary removed  2006  . Abdominal adhesion surgery  2006    BIL FALLOPIAN TUBES REMOVED  . Knee arthroscopy  2001, 2010    left knee x 2  . Hernia repair      umbilical  . Nasal septoplasty w/ turbinoplasty  03/16/2011    Procedure: NASAL SEPTOPLASTY WITH TURBINATE REDUCTION;  Surgeon: Susy Frizzle, MD;  Location: [facility] OR;  Service: ENT;  Laterality: Bilateral;  . Laparoscopic gastric banding    . Pilonidal cyst excision    . Esophagogastroduodenoscopy  04/05/2011    Procedure: ESOPHAGOGASTRODUODENOSCOPY (EGD);  Surgeon: Charna Elizabeth, MD;  Location: [facility] ENDOSCOPY;  Service: Endoscopy;  Laterality: N/A;  . Removal of lap band    . Colon surgery      MAY 2015     BOWEL OBST s/p partial bowel resection [facility] [place])  . Anterior cervical decomp/discectomy fusion N/A 04/28/2015    Procedure: Cervical five-six, Cervical six - seven Anterior cervical decompression/diskectomy/fusion;  Surgeon: Tia Alert, MD;  Location: [facility] NEURO ORS;  Service: Neurosurgery;  Laterality: N/A;  C5-6 C6-7 Anterior cervical decompression/diskectomy/fusion    Allergies  Allergen Reactions  . Prochlorperazine Maleate Other (See Comments)    Patient can tolerate phenergan Major anxiety attack  . Compazine [Prochlorperazine Maleate] Anxiety and Other (See Comments)    dizziness  . Haloperidol And Related Anxiety and Other (See Comments)  dizziness  . Ondansetron Itching  . Reglan [Metoclopramide Hcl] Anxiety and Other (See Comments)    dizziness  . Toradol [Ketorolac Tromethamine] Nausea And Vomiting    Social History  Substance Use Topics  . Smoking status: Never Smoker   . Smokeless tobacco: Never Used  . Alcohol Use: Yes     Comment: rarely    History reviewed. No pertinent family history. Prior to Admission medications   Medication Sig Start Date End Date Taking? Authorizing Provider  acetaminophen (TYLENOL) 500 MG tablet Take 1,000 mg by mouth every 6 (six) hours as needed for moderate  pain or fever (pain & fever).    Yes Historical Provider, MD  DULoxetine (CYMBALTA) 60 MG capsule Take 60 mg by mouth daily.    Yes Historical Provider, MD  levonorgestrel (MIRENA) 20 MCG/24HR IUD 1 each by Intrauterine route once. 2010   Yes Historical Provider, MD  methocarbamol (ROBAXIN) 500 MG tablet Take 1 tablet (500 mg total) by mouth every 6 (six) hours as needed for muscle spasms. 04/29/15  Yes Tia Alertavid S Jones, MD  NON FORMULARY Take 1 capsule by mouth daily. Oil of Oregano Herbal Supplement.   Yes Historical Provider, MD  oxyCODONE-acetaminophen (PERCOCET/ROXICET) 5-325 MG tablet Take 1-2 tablets by mouth every 6 (six) hours as needed for severe pain. 04/29/15  Yes Tia Alertavid S Jones, MD  Probiotic Product (PROBIOTIC DAILY) CAPS Take 1 capsule by mouth daily.   Yes Historical Provider, MD  QUEtiapine (SEROQUEL) 25 MG tablet Take 75 mg by mouth at bedtime as needed (sleep or migraines).    Yes Historical Provider, MD  polyethylene glycol powder (GLYCOLAX/MIRALAX) powder Take 17 g by mouth 2 (two) times daily. Until daily soft stools  OTC Patient not taking: Reported on 04/22/2015 04/13/14   Everlene FarrierWilliam Dansie, PA-C  promethazine (PHENERGAN) 12.5 MG tablet Take 1 tablet (12.5 mg total) by mouth every 6 (six) hours as needed for nausea or vomiting. Patient not taking: Reported on 04/22/2015 04/13/14   Everlene FarrierWilliam Dansie, PA-C     Review of Systems  Positive ROS: As above  All other systems have been reviewed and were otherwise negative with the exception of those mentioned in the HPI and as above.  Objective: Vital signs in last 24 hours: Temp:  [98.4 F (36.9 C)-99 F (37.2 C)] 99 F (37.2 C) (12/31 0513) Pulse Rate:  [93-109] 103 (12/31 0513) Resp:  [13-18] 18 (12/31 0513) BP: (103-135)/(78-91) 129/91 mmHg (12/31 0513) SpO2:  [94 %-99 %] 94 % (12/31 0513) Weight:  [104.327 kg (230 lb)] 104.327 kg (230 lb) (12/31 16100339)  Physical exam:  General: An alert and pleasant 34 year old obese white  female in no apparent distress. She does not appear particularly ill.  HEENT: Unremarkable  Neck: The patient's right-sided anterior cervical incision has a minimal amount of abrasion at the medial aspect. I don't see or palpate any significant swelling although her neck is a bit large.  Neurologic exam: The patient is alert and oriented 3. She is moving all 4 extremities well.  I have reviewed the patient's neck CT [facility]. There is some prevertebral swelling with a small hematoma. There is a small amount of gas in the tissues. There is midline minimal shift.   Data Review Lab Results  Component Value Date   WBC 9.2 05/08/2015   HGB 13.4 05/08/2015   HCT 39.1 05/08/2015   MCV 88.9 05/08/2015   PLT 412* 05/08/2015   Lab Results  Component Value Date  NA 141 05/08/2015   K 3.9 05/08/2015   CL 107 05/08/2015   CO2 22 05/08/2015   BUN 11 05/08/2015   CREATININE 0.54 05/08/2015   GLUCOSE 177* 05/08/2015   Lab Results  Component Value Date   INR 0.94 04/03/2011    Assessment/Plan: Cervicalgia, dysphagia, dehydration: I have discussed the situation with the patient and her husband. I have told her that she has some swelling in her neck, perhaps a bit more than average 9 days after surgery but not enough to do surgery. I think she will continue to improve with time. I think she would feel better if we could start steroids but she developed a rash with her last steroid dose. We will continue observation with IV hydration. Perhaps she can go home in a day or 2. I have answered all her questions.  Carime Dinkel D 05/08/2015 8:02 AM

## 2015-05-08 NOTE — ED Provider Notes (Addendum)
CSN: [id number]     Arrival date & time 05/08/15  7829 History  By signing my name below, I, Loretta Alexander, attest that this documentation has been prepared under the direction and in the presence of Gilda Crease, MD. Electronically Signed: Sonum Alexander, Neurosurgeon. 05/08/2015. 3:31 AM.    No chief complaint on file.   The history is provided by the patient. No language interpreter was used.     HPI Comments: Loretta Alexander is a 34 y.o. female who presents to the Emergency Department complaining of 3 days of intermittent, unchanged SOB with associated nausea, fever, and chills. Patient had an anterior cervical decomp/discectomy on 04/28/15 but is now complaining of pain and intermittent swelling worsen than after the procedure. She states at times she wakes from sleep gasping for air and she rates her pain as 8/10 currently. She reports a TMAX of 102.5 F at home a couple of days ago.   Past Medical History  Diagnosis Date  . Migraines   . Pneumonia     04/2010  . Eye infection     within last 2 weeks  . Depression   . Intestinal obstruction (HCC)   . Gastritis 03/2011    Per EGD  . Obesity   . Ovarian cyst, right     Hx of oophorectomy for adhesions  . Drug-seeking behavior   . Renal disorder     Kidney Stones  . UTI (lower urinary tract infection)   . Dysrhythmia     INCREASED HR AT TIMES  . Thyroid nodule   . GERD (gastroesophageal reflux disease)     OCC TUMS  . Complication of anesthesia     Difficult IV stick with Central line placement, TACHY 180-190 S/P COLON SURGERY (resolved with treatment for pain)    Past Surgical History  Procedure Laterality Date  . Tonsillectomy  1991  . Appendectomy  1992  . Cholecystectomy  2003  . Right ovary removed  2006  . Abdominal adhesion surgery  2006    BIL FALLOPIAN TUBES REMOVED  . Knee arthroscopy  2001, 2010    left knee x 2  . Hernia repair      umbilical  . Nasal septoplasty w/ turbinoplasty  03/16/2011   Procedure: NASAL SEPTOPLASTY WITH TURBINATE REDUCTION;  Surgeon: Susy Frizzle, MD;  Location: [facility] OR;  Service: ENT;  Laterality: Bilateral;  . Laparoscopic gastric banding    . Pilonidal cyst excision    . Esophagogastroduodenoscopy  04/05/2011    Procedure: ESOPHAGOGASTRODUODENOSCOPY (EGD);  Surgeon: Charna Elizabeth, MD;  Location: [facility];  Service: Endoscopy;  Laterality: N/A;  . Removal of lap band    . Colon surgery      MAY 2015     BOWEL OBST s/p partial bowel resection [facility])  . Anterior cervical decomp/discectomy fusion N/A 04/28/2015    Procedure: Cervical five-six, Cervical six - seven Anterior cervical decompression/diskectomy/fusion;  Surgeon: Tia Alert, MD;  Location: [facility] NEURO ORS;  Service: Neurosurgery;  Laterality: N/A;  C5-6 C6-7 Anterior cervical decompression/diskectomy/fusion   No family history on file. Social History  Substance Use Topics  . Smoking status: Never Smoker   . Smokeless tobacco: Never Used  . Alcohol Use: Yes     Comment: rarely   OB History    No data available     Review of Systems  Constitutional: Positive for fever and chills.  Respiratory: Positive for shortness of breath.   Gastrointestinal: Positive for nausea.  Musculoskeletal:  Positive for neck pain.  All other systems reviewed and are negative.     Allergies  Prochlorperazine maleate; Compazine; Haloperidol and related; Ondansetron; Reglan; and Toradol  Home Medications   Prior to Admission medications   Medication Sig Start Date End Date Taking? Authorizing Provider  acetaminophen (TYLENOL) 500 MG tablet Take 1,000 mg by mouth every 6 (six) hours as needed for moderate pain or fever (pain & fever).     Historical Provider, MD  DULoxetine (CYMBALTA) 60 MG capsule Take 60 mg by mouth daily.     Historical Provider, MD  levonorgestrel (MIRENA) 20 MCG/24HR IUD 1 each by Intrauterine route once. 2010    Historical Provider, MD  methocarbamol (ROBAXIN) 500 MG tablet  Take 1 tablet (500 mg total) by mouth every 6 (six) hours as needed for muscle spasms. 04/29/15   Tia Alertavid S Jones, MD  NON FORMULARY Take 1 capsule by mouth daily. Oil of Oregano Herbal Supplement.    Historical Provider, MD  oxyCODONE-acetaminophen (PERCOCET/ROXICET) 5-325 MG tablet Take 1-2 tablets by mouth every 6 (six) hours as needed for severe pain. 04/29/15   Tia Alertavid S Jones, MD  polyethylene glycol powder (GLYCOLAX/MIRALAX) powder Take 17 g by mouth 2 (two) times daily. Until daily soft stools  OTC Patient not taking: Reported on 04/22/2015 04/13/14   Everlene FarrierWilliam Dansie, PA-C  Probiotic Product (PROBIOTIC DAILY) CAPS Take 1 capsule by mouth daily.    Historical Provider, MD  promethazine (PHENERGAN) 12.5 MG tablet Take 1 tablet (12.5 mg total) by mouth every 6 (six) hours as needed for nausea or vomiting. Patient not taking: Reported on 04/22/2015 04/13/14   Everlene FarrierWilliam Dansie, PA-C  QUEtiapine (SEROQUEL) 25 MG tablet Take 75 mg by mouth at bedtime as needed (sleep or migraines).     Historical Provider, MD   There were no vitals taken for this visit. Physical Exam  Constitutional: She is oriented to person, place, and time. She appears well-developed and well-nourished. No distress.  HENT:  Head: Normocephalic and atraumatic.  Right Ear: Hearing normal.  Left Ear: Hearing normal.  Nose: Nose normal.  Mouth/Throat: Oropharynx is clear and moist and mucous membranes are normal.  Eyes: Conjunctivae and EOM are normal. Pupils are equal, round, and reactive to light.  Neck: Normal range of motion. Neck supple. No erythema present.  Healing anterior cervical incision to the right. No erythema, warmth, or drainage. Tenderness and mild swelling diffusely  to anterior neck   Cardiovascular: Normal rate, regular rhythm, S1 normal and S2 normal.  Exam reveals no gallop and no friction rub.   No murmur heard. Pulmonary/Chest: Effort normal and breath sounds normal. No respiratory distress. She exhibits no  tenderness.  Abdominal: Soft. Normal appearance and bowel sounds are normal. There is no hepatosplenomegaly. There is no tenderness. There is no rebound, no guarding, no tenderness at McBurney's point and negative Murphy's sign. No hernia.  Musculoskeletal: Normal range of motion.  Neurological: She is alert and oriented to person, place, and time. She has normal strength. No cranial nerve deficit or sensory deficit. Coordination normal. GCS eye subscore is 4. GCS verbal subscore is 5. GCS motor subscore is 6.  Skin: Skin is warm, dry and intact. No rash noted. No cyanosis.  Psychiatric: She has a normal mood and affect. Her speech is normal and behavior is normal. Thought content normal.  Nursing note and vitals reviewed.   ED Course  Procedures (including critical care time)  DIAGNOSTIC STUDIES: Oxygen Saturation is 97% on RA, adequate by  my interpretation.    COORDINATION OF CARE: 3:39 AM Discussed treatment plan with pt at bedside and pt agreed to plan.   Labs Review Labs Reviewed - No data to display  Imaging Review No results found. I have personally reviewed and evaluated these images and lab results as part of my medical decision-making.   EKG Interpretation   Date/Time:  Saturday May 08 2015 03:40:35 EST Ventricular Rate:  98 PR Interval:  139 QRS Duration: 96 QT Interval:  371 QTC Calculation: 474 R Axis:   14 Text Interpretation:  Sinus rhythm Low voltage, precordial leads RSR' in  V1 or V2, right VCD or RVH Consider anterior infarct Confirmed by Kimra Kantor   MD, Cyntia Staley ([id number]) on 05/08/2015 4:35:02 AM      MDM   Final diagnoses:  None  post-operative pain  Patient transferred from [facility] where she was evaluated for pain in her neck, swelling of her neck and shortness of breath. Patient underwent anterior cervical fusion on December 21 here at [facility] She reports that she was doing well postoperatively, but in the last 3 or 4  days she has noticed increased swelling, increased pain. She reports fever of 102 at home. She had a CT scan performed at [facility] which showed a fluid collection that was pressing on soft tissue structures. Airway is patent. This could be inflammatory, hematoma, infectious. She is not febrile here in the ER. Patient reports that she has been having significant amounts of nausea and vomiting at home. She is not tolerating oral intake. Discussed briefly with Dr. Lovell Sheehan, on call for neurosurgery. Patient will be admitted to his service for further management.  I personally performed the services described in this documentation, which was scribed in my presence. The recorded information has been reviewed and is accurate.    Gilda Crease, MD 05/08/15 [id number]  Gilda Crease, MD 05/08/15 [phone]

## 2015-05-08 NOTE — ED Notes (Signed)
Phleb at bedside to draw labs. 

## 2015-05-08 NOTE — ED Notes (Signed)
Per EMS - pt is [facility]. Pt recent surgery C6 and C7 at [facility]. C/o nausea and pain in neck w/ shortness of breath. Noted nodule behind incision. VSS. A&O x 4. Given 1mg  dilaudid and 12.5 phenergan at 0100 today.

## 2015-05-08 NOTE — Progress Notes (Signed)
Utilization Review Completed.Loretta Alexander T12/31/2016  

## 2015-05-09 DIAGNOSIS — Z90721 Acquired absence of ovaries, unilateral: Secondary | ICD-10-CM | POA: Diagnosis not present

## 2015-05-09 DIAGNOSIS — Z981 Arthrodesis status: Secondary | ICD-10-CM | POA: Diagnosis not present

## 2015-05-09 DIAGNOSIS — E860 Dehydration: Secondary | ICD-10-CM | POA: Diagnosis present

## 2015-05-09 DIAGNOSIS — R0682 Tachypnea, not elsewhere classified: Secondary | ICD-10-CM | POA: Diagnosis present

## 2015-05-09 DIAGNOSIS — Z87442 Personal history of urinary calculi: Secondary | ICD-10-CM | POA: Diagnosis not present

## 2015-05-09 DIAGNOSIS — F419 Anxiety disorder, unspecified: Secondary | ICD-10-CM | POA: Diagnosis present

## 2015-05-09 DIAGNOSIS — R1310 Dysphagia, unspecified: Secondary | ICD-10-CM

## 2015-05-09 DIAGNOSIS — E669 Obesity, unspecified: Secondary | ICD-10-CM | POA: Diagnosis present

## 2015-05-09 DIAGNOSIS — K219 Gastro-esophageal reflux disease without esophagitis: Secondary | ICD-10-CM | POA: Diagnosis present

## 2015-05-09 DIAGNOSIS — Z9109 Other allergy status, other than to drugs and biological substances: Secondary | ICD-10-CM | POA: Diagnosis not present

## 2015-05-09 DIAGNOSIS — Z9889 Other specified postprocedural states: Secondary | ICD-10-CM | POA: Diagnosis not present

## 2015-05-09 DIAGNOSIS — Z9049 Acquired absence of other specified parts of digestive tract: Secondary | ICD-10-CM | POA: Diagnosis not present

## 2015-05-09 DIAGNOSIS — G8918 Other acute postprocedural pain: Secondary | ICD-10-CM | POA: Diagnosis present

## 2015-05-09 DIAGNOSIS — Z79899 Other long term (current) drug therapy: Secondary | ICD-10-CM | POA: Diagnosis not present

## 2015-05-09 DIAGNOSIS — Z9884 Bariatric surgery status: Secondary | ICD-10-CM | POA: Diagnosis not present

## 2015-05-09 MED ORDER — LACTATED RINGERS IV SOLN
INTRAVENOUS | Status: DC
  Administered 2015-05-10: 75 mL/h via INTRAVENOUS

## 2015-05-09 MED ORDER — ALUM & MAG HYDROXIDE-SIMETH 200-200-20 MG/5ML PO SUSP: 30.0000 mL | Freq: Four times a day (QID) | ORAL | Status: DC | PRN

## 2015-05-09 MED ORDER — HYDROCODONE-ACETAMINOPHEN 5-325 MG PO TABS: 1.0000 | ORAL_TABLET | ORAL | Status: DC | PRN

## 2015-05-09 MED ORDER — ACETAMINOPHEN 325 MG PO TABS: 650.0000 mg | ORAL_TABLET | ORAL | Status: DC | PRN

## 2015-05-09 MED ORDER — PANTOPRAZOLE SODIUM 40 MG IV SOLR
40.0000 mg | Freq: Every day | INTRAVENOUS | Status: DC
Start: 2015-05-09 — End: 2015-05-12
  Administered 2015-05-09 – 2015-05-11 (×3): 40 mg via INTRAVENOUS
  Filled 2015-05-09 (×3): qty 40

## 2015-05-09 MED ORDER — MENTHOL 3 MG MT LOZG
1.0000 | LOZENGE | OROMUCOSAL | Status: DC | PRN
  Filled 2015-05-09: qty 9

## 2015-05-09 MED ORDER — MORPHINE SULFATE (PF) 2 MG/ML IV SOLN
1.0000 mg | INTRAVENOUS | Status: DC | PRN
  Administered 2015-05-12 (×3): 2 mg via INTRAVENOUS
  Filled 2015-05-09 (×3): qty 1

## 2015-05-09 MED ORDER — OXYCODONE-ACETAMINOPHEN 5-325 MG PO TABS
1.0000 | ORAL_TABLET | ORAL | Status: DC | PRN
  Administered 2015-05-12: 2 via ORAL
  Administered 2015-05-12: 1 via ORAL
  Administered 2015-05-12: 2 via ORAL
  Filled 2015-05-09: qty 1
  Filled 2015-05-09 (×2): qty 2

## 2015-05-09 MED ORDER — ACETAMINOPHEN 650 MG RE SUPP: 650.0000 mg | RECTAL | Status: DC | PRN

## 2015-05-09 MED ORDER — OXYCODONE-ACETAMINOPHEN 5-325 MG PO TABS: 1.0000 | ORAL_TABLET | ORAL | Status: DC | PRN

## 2015-05-09 MED ORDER — DIAZEPAM 5 MG PO TABS
5.0000 mg | ORAL_TABLET | Freq: Four times a day (QID) | ORAL | Status: DC | PRN
  Administered 2015-05-09: 5 mg via ORAL
  Filled 2015-05-09 (×2): qty 1

## 2015-05-09 MED ORDER — PHENOL 1.4 % MT LIQD: 1.0000 | OROMUCOSAL | Status: DC | PRN

## 2015-05-09 NOTE — Progress Notes (Signed)
Pt continues to complain of pain in her throat and difficulty swallowing. Yankauer suction placed at bedside because patient was upset when she thought she was choking on her saliva. Valium pill given crushed and in liquid, yet she still thought the pill was lodged in her throat and she started having dry heaves. Insists that she can only take IV meds.

## 2015-05-09 NOTE — Progress Notes (Signed)
Subjective: Patient reports still quite uncomfortable  Objective: Vital signs in last 24 hours: Temp:  [98 F (36.7 C)-98.8 F (37.1 C)] 98.8 F (37.1 C) (12/31 2017) Pulse Rate:  [96-119] 96 (12/31 2017) Resp:  [18] 18 (12/31 2017) BP: (119-124)/(76-85) 121/83 mmHg (12/31 2017) SpO2:  [100 %] 100 % (12/31 2017)  Intake/Output from previous day:   Intake/Output this shift:    Physical Exam: Strength full all extremities.  Mild swelling at site of incision.  Had a difficult time swallowing [place] toast this AM.    Lab Results:  Recent Labs  05/08/15 0438  WBC 9.2  HGB 13.4  HCT 39.1  PLT 412*   BMET  Recent Labs  05/08/15 0438  NA 141  K 3.9  CL 107  CO2 22  GLUCOSE 177*  BUN 11  CREATININE 0.54  CALCIUM 9.1    Studies/Results: No results found.  Assessment/Plan: Patient refuses steroids, says IV was infiltrated, so she did not get pain meds last night.  Will replace IV and observe.       Loretta Alexander,Loretta Vantine D, MD 05/09/2015, 11:12 AM

## 2015-05-10 MED ORDER — SCOPOLAMINE 1 MG/3DAYS TD PT72
1.0000 | MEDICATED_PATCH | TRANSDERMAL | Status: DC
Start: 2015-05-10 — End: 2015-05-13
  Administered 2015-05-10: 1.5 mg via TRANSDERMAL
  Filled 2015-05-10 (×2): qty 1

## 2015-05-10 NOTE — Progress Notes (Signed)
Subjective: Patient reports "I got choked on grits this morning"  Objective: Vital signs in last 24 hours: Temp:  [98.6 F (37 C)-99.5 F (37.5 C)] 98.6 F (37 C) (01/02 0640) Pulse Rate:  [100-112] 112 (01/02 0640) Resp:  [16-18] 16 (01/02 0640) BP: (101-109)/(69-83) 101/83 mmHg (01/02 0640) SpO2:  [94 %-99 %] 94 % (01/02 0640)  Intake/Output from previous day: 01/01 0701 - 01/02 0700 In: 640 [P.O.:640] Out: -  Intake/Output this shift:    Physical Exam: Mild incisional swelling.  No upper extremity complaints.  Has been ambulating without difficulty.  Lab Results:  Recent Labs  05/08/15 0438  WBC 9.2  HGB 13.4  HCT 39.1  PLT 412*   BMET  Recent Labs  05/08/15 0438  NA 141  K 3.9  CL 107  CO2 22  GLUCOSE 177*  BUN 11  CREATININE 0.54  CALCIUM 9.1    Studies/Results: No results found.  Assessment/Plan: Still does not want to try steroids.  Patient is concerned that she is still nauseated.  I advised patient to eat softer foods/shakes.  Observe today.    LOS: 1 day    Dorian HeckleSTERN,Tamicka Shimon D, MD 05/10/2015, 12:19 PM

## 2015-05-10 NOTE — Progress Notes (Signed)
Offered Pt a bath. Pt stated along with family that family will assist with bath. Tech provided Pt with bath supplies.

## 2015-05-11 MED ORDER — METHOCARBAMOL 1000 MG/10ML IJ SOLN
500.0000 mg | Freq: Four times a day (QID) | INTRAVENOUS | Status: DC | PRN
  Administered 2015-05-11: 500 mg via INTRAVENOUS
  Filled 2015-05-11 (×3): qty 5

## 2015-05-11 NOTE — Progress Notes (Signed)
Patient ID: Loretta Alexander, female   DOB: 08/20/1980, 35 y.o.   MRN: [id number] Subjective: Patient reports continued nausea and dysphagia  Objective: Vital signs in last 24 hours: Temp:  [98.1 F (36.7 C)-99 F (37.2 C)] 99 F (37.2 C) (01/03 1249) Pulse Rate:  [105-110] 110 (01/03 1249) Resp:  [18] 18 (01/03 1249) BP: (100-113)/(60-90) 105/90 mmHg (01/03 1249) SpO2:  [98 %-100 %] 100 % (01/03 1249)  Intake/Output from previous day: 01/02 0701 - 01/03 0700 In: 360 [P.O.:360] Out: -  Intake/Output this shift: Total I/O In: 350 [P.O.:350] Out: -   Neurologic: Grossly normal, neck soft and flat  Lab Results: Lab Results  Component Value Date   WBC 9.2 05/08/2015   HGB 13.4 05/08/2015   HCT 39.1 05/08/2015   MCV 88.9 05/08/2015   PLT 412* 05/08/2015   Lab Results  Component Value Date   INR 0.94 04/03/2011   BMET Lab Results  Component Value Date   NA 141 05/08/2015   K 3.9 05/08/2015   CL 107 05/08/2015   CO2 22 05/08/2015   GLUCOSE 177* 05/08/2015   BUN 11 05/08/2015   CREATININE 0.54 05/08/2015   CALCIUM 9.1 05/08/2015    Studies/Results: No results found.  Assessment/Plan: swallow eval   LOS: 2 days    Loretta Alexander 05/11/2015, 1:47 PM

## 2015-05-12 ENCOUNTER — Inpatient Hospital Stay (HOSPITAL_COMMUNITY): Payer: BLUE CROSS/BLUE SHIELD

## 2015-05-12 MED ORDER — PROMETHAZINE HCL 25 MG PO TABS
25.0000 mg | ORAL_TABLET | Freq: Four times a day (QID) | ORAL | Status: DC | PRN
  Administered 2015-05-12: 25 mg via ORAL
  Filled 2015-05-12: qty 1

## 2015-05-12 MED ORDER — PANTOPRAZOLE SODIUM 40 MG PO TBEC
40.0000 mg | DELAYED_RELEASE_TABLET | Freq: Every day | ORAL | Status: DC
  Administered 2015-05-12: 40 mg via ORAL
  Filled 2015-05-12: qty 1

## 2015-05-12 MED ORDER — PROMETHAZINE HCL 25 MG PO TABS
12.5000 mg | ORAL_TABLET | Freq: Four times a day (QID) | ORAL | Status: DC | PRN
  Administered 2015-05-12: 12.5 mg via ORAL
  Filled 2015-05-12: qty 1

## 2015-05-12 NOTE — Evaluation (Signed)
Clinical/Bedside Swallow Evaluation Patient Details  Name: Loretta Alexander MRN: [id number] Date of Birth: 03/21/1981  Today's Date: 05/12/2015 Time: SLP Start Time (ACUTE ONLY): 40980925 SLP Stop Time (ACUTE ONLY): 0943 SLP Time Calculation (min) (ACUTE ONLY): 18 min  Past Medical History:  Past Medical History  Diagnosis Date  . Migraines   . Pneumonia     04/2010  . Eye infection     within last 2 weeks  . Depression   . Intestinal obstruction (HCC)   . Gastritis 03/2011    Per EGD  . Obesity   . Ovarian cyst, right     Hx of oophorectomy for adhesions  . Drug-seeking behavior   . Renal disorder     Kidney Stones  . UTI (lower urinary tract infection)   . Dysrhythmia     INCREASED HR AT TIMES  . Thyroid nodule   . GERD (gastroesophageal reflux disease)     OCC TUMS  . Complication of anesthesia     Difficult IV stick with Central line placement, TACHY 180-190 S/P COLON SURGERY (resolved with treatment for pain)    Past Surgical History:  Past Surgical History  Procedure Laterality Date  . Tonsillectomy  1991  . Appendectomy  1992  . Cholecystectomy  2003  . Right ovary removed  2006  . Abdominal adhesion surgery  2006    BIL FALLOPIAN TUBES REMOVED  . Knee arthroscopy  2001, 2010    left knee x 2  . Hernia repair      umbilical  . Nasal septoplasty w/ turbinoplasty  03/16/2011    Procedure: NASAL SEPTOPLASTY WITH TURBINATE REDUCTION;  Surgeon: Loretta FrizzleJefry H Rosen, MD;  Location: [facility] OR;  Service: ENT;  Laterality: Bilateral;  . Laparoscopic gastric banding    . Pilonidal cyst excision    . Esophagogastroduodenoscopy  04/05/2011    Procedure: ESOPHAGOGASTRODUODENOSCOPY (EGD);  Surgeon: Loretta ElizabethJyothi Mann, MD;  Location: [facility];  Service: Endoscopy;  Laterality: N/A;  . Removal of lap band    . Colon surgery      MAY 2015     BOWEL OBST s/p partial bowel resection [facility]  . Anterior cervical decomp/discectomy fusion N/A 04/28/2015    Procedure: Cervical five-six,  Cervical six - seven Anterior cervical decompression/diskectomy/fusion;  Surgeon: Loretta Alertavid S Jones, MD;  Location: [facility] NEURO ORS;  Service: Neurosurgery;  Laterality: N/A;  C5-6 C6-7 Anterior cervical decompression/diskectomy/fusion   HPI:  35 yo female with recent ACDF 04/28/15 (C5-6 and C6-7) who [facility] for c/o neck pain, dysphagia, and dyspnea. [facility] describes "some swelling in her neck, perhaps a bit more than average" and recommended observation. SLP asked to evaluate swallowing difficulties.   Assessment / Plan / Recommendation Clinical Impression  Swallow appears to occur swiftly and she does not have any overt signs of aspiration with thin liquids and purees. She describes having to "think" about swallowing more than usual, and needing multiple swallows to clear POs from her throat. Given that her swallowing difficulties are one of her primary complaints, recommend proceeding with MBS to better assess oropharyngeal function. Pt is fine to continue to try additional POs on full liquid diet as comfortable prior to testing, scheduled for this morning with radiology.    Aspiration Risk  Mild aspiration risk;Risk for inadequate nutrition/hydration    Diet Recommendation  Full liquid   Medication Administration: Crushed with puree    Other  Recommendations Oral Care Recommendations: Oral care  BID   Follow up Recommendations   Defer tx plan until completion of MBS    Frequency and Duration            Prognosis        Swallow Study   General HPI: 35 yo female with recent ACDF 04/28/15 (C5-6 and C6-7) who presented to [facility] ER for c/o neck pain, dysphagia, and dyspnea. Neurosurgery note upon transfer to [facility] reports describes "some swelling in her neck, perhaps a bit more than average" and recommended observation. SLP asked to evaluate swallowing difficulties. Type of Study: Bedside Swallow Evaluation Previous Swallow  Assessment: none, denies h/o swallowing difficulties Diet Prior to this Study: Thin liquids Temperature Spikes Noted: No Respiratory Status: Room air History of Recent Intubation: No Behavior/Cognition: Alert;Cooperative;Pleasant mood;Other (Comment) (appears somewhat anxious) Oral Cavity Assessment: Within Functional Limits Oral Care Completed by SLP: No Oral Cavity - Dentition: Adequate natural dentition Vision: Functional for self-feeding Self-Feeding Abilities: Able to feed self Patient Positioning: Upright in bed Baseline Vocal Quality: Other (comment) (intermittently rough in quality)    Oral/Motor/Sensory Function Overall Oral Motor/Sensory Function: Within functional limits   Ice Chips Ice chips: Not tested   Thin Liquid Thin Liquid: Impaired Presentation: Cup;Self Fed;Straw Pharyngeal  Phase Impairments: Multiple swallows    Nectar Thick Nectar Thick Liquid: Not tested   Honey Thick Honey Thick Liquid: Not tested   Puree Puree: Impaired Presentation: Self Fed;Spoon Pharyngeal Phase Impairments: Multiple swallows   Solid   GO    Solid: Not tested       Loretta Alexander 05/12/2015,9:56 AM

## 2015-05-12 NOTE — Progress Notes (Signed)
MBSS complete. Full report located under chart review in imaging section. Will defer diet advancement to MD/pt per comfort level.  Maxcine HamLaura Paiewonsky, M.A. CCC-SLP [phone]

## 2015-05-12 NOTE — Progress Notes (Signed)
Patient ID: Loretta MinorsStacy K Alexander, female   DOB: 01/03/1981, 35 y.o.   MRN: [id number] Subjective: Patient reports she is getting better slowly. Still c/o "dry heaves" and dysphagia. Thing. No arm pain or numbness tingling or weakness. She reports flatus and 2 bowel movements. She states she feels like she can "do this at home." However, she is still requiring IV Phenergan.  Objective: Vital signs in last 24 hours: Temp:  [98.3 F (36.8 C)-99 F (37.2 C)] 98.3 F (36.8 C) (01/04 0609) Pulse Rate:  [110-122] 119 (01/04 0609) Resp:  [18] 18 (01/04 0609) BP: (97-105)/(63-90) 97/63 mmHg (01/04 0609) SpO2:  [95 %-100 %] 95 % (01/04 0609)  Intake/Output from previous day: 01/03 0701 - 01/04 0700 In: 590 [P.O.:590] Out: -  Intake/Output this shift:    PE is normal, wound soft and flat, and she swallowed water for me without apparent difficulty  Lab Results: Lab Results  Component Value Date   WBC 9.2 05/08/2015   HGB 13.4 05/08/2015   HCT 39.1 05/08/2015   MCV 88.9 05/08/2015   PLT 412* 05/08/2015   Lab Results  Component Value Date   INR 0.94 04/03/2011   BMET Lab Results  Component Value Date   NA 141 05/08/2015   K 3.9 05/08/2015   CL 107 05/08/2015   CO2 22 05/08/2015   GLUCOSE 177* 05/08/2015   BUN 11 05/08/2015   CREATININE 0.54 05/08/2015   CALCIUM 9.1 05/08/2015    Studies/Results: No results found.  Assessment/Plan: She continues to look better than she states she feels. Awaiting swallowing evaluation. May need barium swallow.   LOS: 3 days    JONES,DAVID S 05/12/2015, 8:52 AM

## 2015-05-12 NOTE — Progress Notes (Addendum)
Dr Yetta Barrejones was contacted  at his office but was unable to get in touch with him. Pt doesn't have an  IV access and will want to have a picc line. IV team was unable to insert an IV. OR was called but was unable to reach Dr Yetta Barrejones. RX was called and phenergam 25 mg was switched from IV to PO and patient tolerated well.

## 2015-05-12 NOTE — Progress Notes (Addendum)
IV site rt hand red painful and swollen. IV team informed that pt stated she had been given IV Phenergan without diluting the medication and without running IV with NS IV team suggested heat pack which was place on it and marked will continue to monitor. Ilean SkillVeronica Kalika Smay LPN

## 2015-05-12 NOTE — Progress Notes (Signed)
RN paged Loretta Alexander Neurosurgery twice and called answering service once without a return call. Patient requests and additional one time dose of Phenergan and Benadryl. RN explained the situation to the patient. Nursing will continue to monitor.

## 2015-05-13 MED ORDER — DIPHENHYDRAMINE HCL 25 MG PO CAPS: 25.0000 mg | ORAL_CAPSULE | Freq: Four times a day (QID) | ORAL | Status: DC | PRN

## 2015-05-13 MED ORDER — PROMETHAZINE HCL 12.5 MG PO TABS: 12.5000 mg | ORAL_TABLET | Freq: Four times a day (QID) | ORAL | Status: DC | PRN

## 2015-05-13 NOTE — Progress Notes (Signed)
Patient ID: Guerry MinorsStacy K Garner, female   DOB: 06/10/1980, 35 y.o.   MRN: [id number] Nobody tried to reach me at 5:57 pm last night. At that time of day I would have been off and the call would have gone to the on call physician

## 2015-05-13 NOTE — Progress Notes (Signed)
Pt ready for discharge. Discharge instructions/education reviewed with pt and all questions/concerns addressed. Belongings gathered and pt will be transported out via wheelchair to mother's car. Will continue to monitor

## 2015-05-13 NOTE — Significant Event (Signed)
Rapid Response Event Note Called by primary RN to see pt with concerns or enlarged pupils after pt getting up to the bathroom and becoming very anxious. Overview: Time Called: 0305 Arrival Time: 0308 Event Type: Neurologic  Initial Focused Assessment: On arrival to room to assess the pt she has returned to bed.  She is alert & Ox4 and able to follow commands.  Denies HA or N/V.  She states that her pupils are normally on the smaller side.  She denies any visual loss or difficulty.  Bil. Pupils 5-6 and sluggish, PERRL.  VSS.  Will have nurse reassess once pt has had time to relax from her episode of anxiety.  Interventions:   Event Summary:   at      at          [facility], Willy Vorce Hedgecock

## 2015-05-13 NOTE — Discharge Summary (Signed)
Physician Discharge Summary  Patient ID: Loretta Alexander MRN: [id number] DOB/AGE: [birth year] 35 y.o.  Admit date: 05/08/2015 Discharge date: 05/13/2015  Admission Diagnoses:  Dysphagia after ACDF   Discharge Diagnoses: same   Discharged Condition: good  Hospital Course: The patient was admitted on 05/08/2015 with post-op dysphagia after ACDF.  The hospital course was routine. There were no complications. The wound remained clean dry and intact. Pt had appropriate neck soreness. No complaints of arm pain or new N/T/W. The patient remained afebrile with stable vital signs, and tolerated liquids. The patient continued to increase activities, and pain was well controlled with oral pain medications. MBS was normal, and SLP exam was normal. Most of it was felt secondary to anxiety and not physiologic dysfunction or obstruction. CT was felt to be c/w normal Post-op ACDF findings  Consults: None  Significant Diagnostic Studies:  Results for orders placed or performed during the hospital encounter of 05/08/15  CBC with Differential  Result Value Ref Range   WBC 9.2 4.0 - 10.5 K/uL   RBC 4.40 3.87 - 5.11 MIL/uL   Hemoglobin 13.4 12.0 - 15.0 g/dL   HCT 40.9 81.1 - 91.4 %   MCV 88.9 78.0 - 100.0 fL   MCH 30.5 26.0 - 34.0 pg   MCHC 34.3 30.0 - 36.0 g/dL   RDW 78.2 95.6 - 21.3 %   Platelets 412 (H) 150 - 400 K/uL   Neutrophils Relative % 66 %   Neutro Abs 6.0 1.7 - 7.7 K/uL   Lymphocytes Relative 26 %   Lymphs Abs 2.4 0.7 - 4.0 K/uL   Monocytes Relative 6 %   Monocytes Absolute 0.6 0.1 - 1.0 K/uL   Eosinophils Relative 2 %   Eosinophils Absolute 0.2 0.0 - 0.7 K/uL   Basophils Relative 0 %   Basophils Absolute 0.0 0.0 - 0.1 K/uL  Basic metabolic panel  Result Value Ref Range   Sodium 141 135 - 145 mmol/L   Potassium 3.9 3.5 - 5.1 mmol/L   Chloride 107 101 - 111 mmol/L   CO2 22 22 - 32 mmol/L   Glucose, Bld 177 (H) 65 - 99 mg/dL   BUN 11 6 - 20 mg/dL   Creatinine, Ser 0.86 0.44 - 1.00  mg/dL   Calcium 9.1 8.9 - 57.8 mg/dL   GFR calc non Af Amer >60 >60 mL/min   GFR calc Af Amer >60 >60 mL/min   Anion gap 12 5 - 15    Dg Cervical Spine 2-3 Views  04/28/2015  CLINICAL DATA:  C5-C6 and C6-C7 anterior fusion EXAM: DG C-ARM 61-120 MIN; CERVICAL SPINE - 2-3 VIEW COMPARISON:  None. FINDINGS: Two portable views of the cervical spine submitted. There is endotracheal tube in place. There is anterior fusion with metallic plate and screws at C5-C6 and C6-C7 level. There is anatomic alignment. IMPRESSION: Anterior metallic fusion at C5-C6 and C6-C7 level. There is anatomic alignment. Fluoroscopy time was 16 seconds.  Please see the operative report. Electronically Signed   By: Natasha Mead M.D.   On: 04/28/2015 15:03   Dg Swallowing Func-speech Pathology  05/12/2015  Objective Swallowing Evaluation:   Patient Details Name: Loretta Alexander MRN: [id number] Date of Birth: [birth year] Today's Date: 05/12/2015 Time: SLP Start Time (ACUTE ONLY): 1007-SLP Stop Time (ACUTE ONLY): 1028 SLP Time Calculation (min) (ACUTE ONLY): 21 min Past Medical History: @PMH @ Past Surgical History: Past Surgical History Procedure Laterality Date . Tonsillectomy  1991 . Appendectomy  1992 . Cholecystectomy  2003 .  Right ovary removed  2006 . Abdominal adhesion surgery  2006   BIL FALLOPIAN TUBES REMOVED . Knee arthroscopy  2001, 2010   left knee x 2 . Hernia repair     umbilical . Nasal septoplasty w/ turbinoplasty  03/16/2011   Procedure: NASAL SEPTOPLASTY WITH TURBINATE REDUCTION;  Surgeon: Susy Frizzle, MD;  Location: [facility] OR;  Service: ENT;  Laterality: Bilateral; . Laparoscopic gastric banding   . Pilonidal cyst excision   . Esophagogastroduodenoscopy  04/05/2011   Procedure: ESOPHAGOGASTRODUODENOSCOPY (EGD);  Surgeon: Charna Elizabeth, MD;  Location: [facility];  Service: Endoscopy;  Laterality: N/A; . Removal of lap band   . Colon surgery     MAY 2015     BOWEL OBST s/p partial bowel resection [facility]) . Anterior cervical  decomp/discectomy fusion N/A 04/28/2015   Procedure: Cervical five-six, Cervical six - seven Anterior cervical decompression/diskectomy/fusion;  Surgeon: Tia Alert, MD;  Location: [facility] NEUR[facility] ORS;  Service: Neurosurgery;  Laterality: N/A;  C5-6 C6-7 Anterior cervical decompression/diskectomy/fusion HPI: 35 yo female with recent ACDF 04/28/15 (C5-6 and C6-7) who presented to [facility] ER for c/o neck pain, dysphagia, and dyspnea. Neurosurgery note upon transfer to [facility] reports describes "some swelling in her neck, perhaps a bit more than average" and recommended observation. SLP asked to evaluate swallowing difficulties. Subjective: pt alert, nauseous Assessment / Plan / Recommendation CHL IP CLINICAL IMPRESSIONS 05/12/2015 Therapy Diagnosis WFL Clinical Impression Pt has oral holding, piecemeal swallowing, and premature spillage with thin liquids and soft solids, although this appears to be more volitional due to nervousness related to swallowing as opposed to any physiological impairment. As soon as she begins to transfer a pureed bolus posteriorly in her mouth, she immediately begins to gag and orally expectorates the bolus. She describes associated nausea, although she did not appear to have regurgitation of thin liquids. All solids and liquids that did reach her pharynx were cleared with ease. SLP reviewed results of testing with pt and encouraged her to consume any liquids and soft solids (up to mechanical soft) that she felt comfortable trying. Will defer diet advancements to MD/pt based on her comfort level, however will f/u x1 for tolerance. Impact on safety and function Risk for inadequate nutrition/hydration;Mild aspiration risk   CHL IP TREATMENT RECOMMENDATION 05/12/2015 Treatment Recommendations Therapy as outlined in treatment plan below   Prognosis 05/12/2015 Prognosis for Safe Diet Advancement Good Barriers to Reach Goals Behavior Barriers/Prognosis Comment -- CHL IP DIET RECOMMENDATION 05/12/2015 SLP  Diet Recommendations Other (Comment) Liquid Administration via Cup;Straw Medication Administration Crushed with puree Compensations Slow rate;Small sips/bites;Follow solids with liquid Postural Changes Remain semi-upright after after feeds/meals (Comment)   CHL IP OTHER RECOMMENDATIONS 05/12/2015 Recommended Consults -- Oral Care Recommendations Oral care BID Other Recommendations --   CHL IP FOLLOW UP RECOMMENDATIONS 05/12/2015 Follow up Recommendations None   CHL IP FREQUENCY AND DURATION 05/12/2015 Speech Therapy Frequency (ACUTE ONLY) min 1 x/week Treatment Duration 1 week      CHL IP ORAL PHASE 05/12/2015 Oral Phase Impaired Oral - Pudding Teaspoon -- Oral - Pudding Cup -- Oral - Honey Teaspoon -- Oral - Honey Cup -- Oral - Nectar Teaspoon -- Oral - Nectar Cup -- Oral - Nectar Straw -- Oral - Thin Teaspoon -- Oral - Thin Cup Piecemeal swallowing;Holding of bolus;Premature spillage Oral - Thin Straw Piecemeal swallowing;Holding of bolus;Premature spillage Oral - Puree Holding of bolus;Other (Comment) Oral - Mech Soft Piecemeal swallowing;Holding of bolus;Premature spillage Oral - Regular -- Oral - Multi-Consistency --  Oral - Pill -- Oral Phase - Comment --  CHL IP PHARYNGEAL PHASE 05/12/2015 Pharyngeal Phase WFL Pharyngeal- Pudding Teaspoon -- Pharyngeal -- Pharyngeal- Pudding Cup -- Pharyngeal -- Pharyngeal- Honey Teaspoon -- Pharyngeal -- Pharyngeal- Honey Cup -- Pharyngeal -- Pharyngeal- Nectar Teaspoon -- Pharyngeal -- Pharyngeal- Nectar Cup -- Pharyngeal -- Pharyngeal- Nectar Straw -- Pharyngeal -- Pharyngeal- Thin Teaspoon -- Pharyngeal -- Pharyngeal- Thin Cup -- Pharyngeal -- Pharyngeal- Thin Straw -- Pharyngeal -- Pharyngeal- Puree -- Pharyngeal -- Pharyngeal- Mechanical Soft -- Pharyngeal -- Pharyngeal- Regular -- Pharyngeal -- Pharyngeal- Multi-consistency -- Pharyngeal -- Pharyngeal- Pill -- Pharyngeal -- Pharyngeal Comment --  CHL IP CERVICAL ESOPHAGEAL PHASE 05/12/2015 Cervical Esophageal Phase WFL Pudding  Teaspoon -- Pudding Cup -- Honey Teaspoon -- Honey Cup -- Nectar Teaspoon -- Nectar Cup -- Nectar Straw -- Thin Teaspoon -- Thin Cup -- Thin Straw -- Puree -- Mechanical Soft -- Regular -- Multi-consistency -- Pill -- Cervical Esophageal Comment -- Maxcine HamLaura Paiewonsky, M.A. CCC-SLP [phone] Maxcine Hamaiewonsky, Laura 05/12/2015, 10:53 AM              Dg C-arm 1-60 Min  04/28/2015  CLINICAL DATA:  C5-C6 and C6-C7 anterior fusion EXAM: DG C-ARM 61-120 MIN; CERVICAL SPINE - 2-3 VIEW COMPARISON:  None. FINDINGS: Two portable views of the cervical spine submitted. There is endotracheal tube in place. There is anterior fusion with metallic plate and screws at C5-C6 and C6-C7 level. There is anatomic alignment. IMPRESSION: Anterior metallic fusion at C5-C6 and C6-C7 level. There is anatomic alignment. Fluoroscopy time was 16 seconds.  Please see the operative report. Electronically Signed   By: Natasha MeadLiviu  Pop M.D.   On: 04/28/2015 15:03    Antibiotics:  Anti-infectives    None      Discharge Exam: Blood pressure 112/68, pulse 110, temperature 98.2 F (36.8 C), temperature source Oral, resp. rate 18, height 5\' 2"  (1.575 m), weight 104.327 kg (230 lb), SpO2 96 %. Neurologic: Grossly normal Wound soft and flat  Discharge Medications:     Medication List    STOP taking these medications        polyethylene glycol powder powder  Commonly known as:  GLYCOLAX/MIRALAX      TAKE these medications        acetaminophen 500 MG tablet  Commonly known as:  TYLENOL  Take 1,000 mg by mouth every 6 (six) hours as needed for moderate pain or fever (pain & fever).     DULoxetine 60 MG capsule  Commonly known as:  CYMBALTA  Take 60 mg by mouth daily.     levonorgestrel 20 MCG/24HR IUD  Commonly known as:  MIRENA  1 each by Intrauterine route once. 2010     methocarbamol 500 MG tablet  Commonly known as:  ROBAXIN  Take 1 tablet (500 mg total) by mouth every 6 (six) hours as needed for muscle spasms.     NON  FORMULARY  Take 1 capsule by mouth daily. Oil of Oregano Herbal Supplement.     oxyCODONE-acetaminophen 5-325 MG tablet  Commonly known as:  PERCOCET/ROXICET  Take 1-2 tablets by mouth every 6 (six) hours as needed for severe pain.     PROBIOTIC DAILY Caps  Take 1 capsule by mouth daily.     promethazine 12.5 MG tablet  Commonly known as:  PHENERGAN  Take 1 tablet (12.5 mg total) by mouth every 6 (six) hours as needed for nausea or vomiting.     QUEtiapine 25 MG tablet  Commonly known as:  SEROQUEL  Take 75 mg by mouth at bedtime as needed (sleep or migraines).        Disposition: home   Final Dx: dysphagia      Discharge Instructions    Call MD for:  difficulty breathing, headache or visual disturbances    Complete by:  As directed      Call MD for:  persistant nausea and vomiting    Complete by:  As directed      Call MD for:  redness, tenderness, or signs of infection (pain, swelling, redness, odor or green/yellow discharge around incision site)    Complete by:  As directed      Call MD for:  severe uncontrolled pain    Complete by:  As directed      Call MD for:  temperature >100.4    Complete by:  As directed      Diet - low sodium heart healthy    Complete by:  As directed      Discharge instructions    Complete by:  As directed   Soft diet, may shower, no heavy lifting     Increase activity slowly    Complete by:  As directed            Follow-up Information    Follow up with Macgregor Aeschliman S, MD. Schedule an appointment as soon as possible for a visit in 3 weeks.   Specialty:  Neurosurgery   Contact information:   [address] [place] [address] [phone]        Signed: Tia Alert 05/13/2015, 12:33 PM

## 2015-05-13 NOTE — Progress Notes (Signed)
Called Dr. Yetta BarreJones' office in regards to pt's IV site that infiltrated (RN from night shift left a sticky note on the front of pt's EPIC chart for MD to address). Secretary stated that he was in surgery and she would try to get the message to him when he got out of surgery. Appling another warm pack to the site and instructed the pt to keep her arm elevated. Will continue to monitor.

## 2015-05-13 NOTE — Progress Notes (Signed)
Rapid respond called due to non reactive pupils to reassess the patient. Ilean SkillVeronica Danta Baumgardner LPN

## 2015-11-26 ENCOUNTER — Emergency Department (HOSPITAL_BASED_OUTPATIENT_CLINIC_OR_DEPARTMENT_OTHER)
Admission: EM | Admit: 2015-11-26 | Discharge: 2015-11-27 | Disposition: A | Discharge status: Home / Self Care | Payer: BLUE CROSS/BLUE SHIELD | Attending: Emergency Medicine | Admitting: Emergency Medicine

## 2015-11-26 ENCOUNTER — Emergency Department (HOSPITAL_BASED_OUTPATIENT_CLINIC_OR_DEPARTMENT_OTHER): Payer: BLUE CROSS/BLUE SHIELD

## 2015-11-26 ENCOUNTER — Encounter (HOSPITAL_BASED_OUTPATIENT_CLINIC_OR_DEPARTMENT_OTHER): Payer: Self-pay

## 2015-11-26 DIAGNOSIS — R300 Dysuria: Secondary | ICD-10-CM | POA: Insufficient documentation

## 2015-11-26 DIAGNOSIS — R109 Unspecified abdominal pain: Secondary | ICD-10-CM

## 2015-11-26 DIAGNOSIS — R1110 Vomiting, unspecified: Secondary | ICD-10-CM

## 2015-11-26 DIAGNOSIS — R51 Headache: Secondary | ICD-10-CM | POA: Diagnosis not present

## 2015-11-26 DIAGNOSIS — R0602 Shortness of breath: Secondary | ICD-10-CM | POA: Insufficient documentation

## 2015-11-26 DIAGNOSIS — K921 Melena: Secondary | ICD-10-CM | POA: Insufficient documentation

## 2015-11-26 DIAGNOSIS — R112 Nausea with vomiting, unspecified: Secondary | ICD-10-CM | POA: Insufficient documentation

## 2015-11-26 DIAGNOSIS — R1033 Periumbilical pain: Secondary | ICD-10-CM | POA: Insufficient documentation

## 2015-11-26 DIAGNOSIS — R197 Diarrhea, unspecified: Secondary | ICD-10-CM | POA: Insufficient documentation

## 2015-11-26 DIAGNOSIS — F329 Major depressive disorder, single episode, unspecified: Secondary | ICD-10-CM | POA: Diagnosis not present

## 2015-11-26 LAB — COMPREHENSIVE METABOLIC PANEL WITH GFR
ALT: 45 U/L (ref 14–54)
AST: 42 U/L — ABNORMAL HIGH (ref 15–41)
Albumin: 4.3 g/dL (ref 3.5–5.0)
Alkaline Phosphatase: 59 U/L (ref 38–126)
Anion gap: 9 (ref 5–15)
BUN: 11 mg/dL (ref 6–20)
CO2: 22 mmol/L (ref 22–32)
Calcium: 9 mg/dL (ref 8.9–10.3)
Chloride: 107 mmol/L (ref 101–111)
Creatinine, Ser: 0.5 mg/dL (ref 0.44–1.00)
GFR calc Af Amer: >60 mL/min
GFR calc non Af Amer: >60 mL/min
Glucose, Bld: 153 mg/dL — ABNORMAL HIGH (ref 65–99)
Potassium: 3.4 mmol/L — ABNORMAL LOW (ref 3.5–5.1)
Sodium: 138 mmol/L (ref 135–145)
Total Bilirubin: 0.9 mg/dL (ref 0.3–1.2)
Total Protein: 7.2 g/dL (ref 6.5–8.1)

## 2015-11-26 LAB — CBC WITH DIFFERENTIAL/PLATELET
BASOS PCT: 1 %
Basophils Absolute: 0.1 10*3/uL (ref 0.0–0.1)
EOS ABS: 0.2 10*3/uL (ref 0.0–0.7)
Eosinophils Relative: 2 %
HEMATOCRIT: 38 % (ref 36.0–46.0)
HEMOGLOBIN: 13.3 g/dL (ref 12.0–15.0)
LYMPHS ABS: 2.8 10*3/uL (ref 0.7–4.0)
Lymphocytes Relative: 28 %
MCH: 31.4 pg (ref 26.0–34.0)
MCHC: 35 g/dL (ref 30.0–36.0)
MCV: 89.6 fL (ref 78.0–100.0)
MONOS PCT: 11 %
Monocytes Absolute: 1.1 10*3/uL — ABNORMAL HIGH (ref 0.1–1.0)
NEUTROS ABS: 6.1 10*3/uL (ref 1.7–7.7)
NEUTROS PCT: 60 %
Platelets: 374 10*3/uL (ref 150–400)
RBC: 4.24 MIL/uL (ref 3.87–5.11)
RDW: 13 % (ref 11.5–15.5)
WBC: 10.1 10*3/uL (ref 4.0–10.5)

## 2015-11-26 LAB — URINALYSIS, ROUTINE W REFLEX MICROSCOPIC
Glucose, UA: NEGATIVE mg/dL
Ketones, ur: 15 mg/dL — AB
Nitrite: NEGATIVE
Protein, ur: NEGATIVE mg/dL
Specific Gravity, Urine: 1.024 (ref 1.005–1.030)
pH: 5 (ref 5.0–8.0)

## 2015-11-26 LAB — URINE MICROSCOPIC-ADD ON

## 2015-11-26 LAB — PREGNANCY, URINE: Preg Test, Ur: NEGATIVE

## 2015-11-26 LAB — OCCULT BLOOD X 1 CARD TO LAB, STOOL: FECAL OCCULT BLD: NEGATIVE

## 2015-11-26 LAB — LIPASE, BLOOD: Lipase: 28 U/L (ref 11–51)

## 2015-11-26 MED ORDER — HYDROMORPHONE HCL 1 MG/ML IJ SOLN
1.0000 mg | INTRAMUSCULAR | Status: DC | PRN
  Administered 2015-11-26: 1 mg via INTRAVENOUS
  Filled 2015-11-26: qty 1

## 2015-11-26 MED ORDER — PROMETHAZINE HCL 25 MG/ML IJ SOLN
12.5000 mg | Freq: Once | INTRAMUSCULAR | Status: AC
  Administered 2015-11-26: 12.5 mg via INTRAVENOUS
  Filled 2015-11-26: qty 1

## 2015-11-26 MED ORDER — ONDANSETRON HCL 4 MG/2ML IJ SOLN
4.0000 mg | Freq: Once | INTRAMUSCULAR | Status: AC
  Administered 2015-11-26: 4 mg via INTRAVENOUS
  Filled 2015-11-26: qty 2

## 2015-11-26 MED ORDER — DIPHENHYDRAMINE HCL 50 MG/ML IJ SOLN
12.5000 mg | Freq: Once | INTRAMUSCULAR | Status: AC
  Administered 2015-11-26: 12.5 mg via INTRAVENOUS
  Filled 2015-11-26: qty 1

## 2015-11-26 MED ORDER — SODIUM CHLORIDE 0.9 % IV BOLUS (SEPSIS)
1000.0000 mL | Freq: Once | INTRAVENOUS | Status: AC
  Administered 2015-11-26: 1000 mL via INTRAVENOUS

## 2015-11-26 MED ORDER — MORPHINE SULFATE (PF) 4 MG/ML IV SOLN
4.0000 mg | Freq: Once | INTRAVENOUS | Status: AC
  Administered 2015-11-26: 4 mg via INTRAVENOUS
  Filled 2015-11-26: qty 1

## 2015-11-26 NOTE — ED Notes (Signed)
abd pain x 4 days-n/v this am-states she was on the way out of town but had increase in pain

## 2015-11-26 NOTE — ED Notes (Signed)
Pt states she felt like she had a temp, EMT took temp and got 98.6. Pt also states her pain is increasing. Nurse notified.

## 2015-11-26 NOTE — ED Provider Notes (Signed)
CSN: [id number]     Arrival date & time 11/26/15  1952 History  By signing my name below, I, Linna Darner, attest that this documentation has been prepared under the direction and in the presence of non-physician practitioner, Buel Ream, PA-C. Electronically Signed: Linna Darner, Scribe. 11/26/2015. 9:45 PM.   Chief Complaint  Patient presents with  . Abdominal Pain    The history is provided by the patient. No language interpreter was used.     HPI Comments: Loretta Alexander is a 35 y.o. female with PMHx of UTI, gastritis, intestinal obstruction, ovarian cyst, kidney stones, migraines, and drug-seeking behavior who presents to the Emergency Department complaining of sudden onset, constant, worsening, severe, throbbing, sharp, periumbilical abdominal pain for the last four days. Pt states the pain intermittently radiates into her RUQ. She states that her abdominal pain feels like previous bowel obstructions. Pt endorses associated nausea, vomiting, streaking hematemesis, intermittent SOB, fever, chills, and headache. Pt notes she has a headache currently and it is not as severe as her migraines. She reports she has not measured her temperature but has felt hot and cold intermittently since onset. She states she has vomited 5 times since arrival to the ER tonight and has been vomiting every thirty minutes since onset. She states she experienced a pressure-like sensation at the end of urinating a couple of days ago and felt like she did not "get it all out." She reports that she has experienced melena and hematochezia intermittently over the last several weeks but has not experienced these symptoms since onset of her abdominal pain. Pt notes decreased bowel movements recently and states they are runny when she has them. Pt notes an extensive list of abdominal surgeries due to adhesions, ruptured appendix, and multiple bowel obstructions; her last bowel obstruction was last year. Patient states her  last bowel obstruction did not show up on CT scan and was admitted to [facility] and surgery 3 days later with necrotic bowel with large colonic resection. She states she was travelling out of town tonight but decided to come to the ER due to the severity of her symptoms. Her last PO was 230 PM yesterday. Pt denies neuro deficits, dysuria, chest pain, or any other associated symptoms. Her last visit to her gastroenterologist was more than a year ago.  Past Medical History  Diagnosis Date  . Migraines   . Pneumonia     04/2010  . Eye infection     within last 2 weeks  . Depression   . Intestinal obstruction (HCC)   . Gastritis 03/2011    Per EGD  . Obesity   . Ovarian cyst, right     Hx of oophorectomy for adhesions  . Drug-seeking behavior   . Renal disorder     Kidney Stones  . UTI (lower urinary tract infection)   . Dysrhythmia     INCREASED HR AT TIMES  . Thyroid nodule   . GERD (gastroesophageal reflux disease)     OCC TUMS  . Complication of anesthesia     Difficult IV stick with Central line placement, TACHY 180-190 S/P COLON SURGERY (resolved with treatment for pain)    Past Surgical History  Procedure Laterality Date  . Tonsillectomy  1991  . Appendectomy  1992  . Cholecystectomy  2003  . Right ovary removed  2006  . Abdominal adhesion surgery  2006    BIL FALLOPIAN TUBES REMOVED  . Knee arthroscopy  2001, 2010    left knee x  2  . Hernia repair      umbilical  . Nasal septoplasty w/ turbinoplasty  03/16/2011    Procedure: NASAL SEPTOPLASTY WITH TURBINATE REDUCTION;  Surgeon: Susy Frizzle, MD;  Location: [facility] OR;  Service: ENT;  Laterality: Bilateral;  . Laparoscopic gastric banding    . Pilonidal cyst excision    . Esophagogastroduodenoscopy  04/05/2011    Procedure: ESOPHAGOGASTRODUODENOSCOPY (EGD);  Surgeon: Charna Elizabeth, MD;  Location: [facility] END[facility];  Service: Endoscopy;  Laterality: N/A;  . Removal of lap band    . Colon surgery      MAY 2015     BOWEL OBST  s/p partial bowel resection [facility] [place])  . Anterior cervical decomp/discectomy fusion N/A 04/28/2015    Procedure: Cervical five-six, Cervical six - seven Anterior cervical decompression/diskectomy/fusion;  Surgeon: Tia Alert, MD;  Location: [facility] NEURO ORS;  Service: Neurosurgery;  Laterality: N/A;  C5-6 C6-7 Anterior cervical decompression/diskectomy/fusion   No family history on file. Social History  Substance Use Topics  . Smoking status: Never Smoker   . Smokeless tobacco: Never Used  . Alcohol Use: Yes     Comment: rarely   OB History    No data available     Review of Systems  Constitutional: Positive for fever and chills.  HENT: Negative for facial swelling and sore throat.   Respiratory: Positive for shortness of breath.   Cardiovascular: Negative for chest pain.  Gastrointestinal: Positive for nausea, vomiting, abdominal pain (periumbilical, RUQ), diarrhea and blood in stool.       Positive for hematemesis. Positive for melena.  Genitourinary: Positive for difficulty urinating. Negative for dysuria.  Skin: Negative for rash and wound.  Neurological: Positive for headaches.       Negative for sensation loss.  Psychiatric/Behavioral: The patient is not nervous/anxious.     Allergies  Prochlorperazine maleate; Compazine; Haloperidol and related; Reglan; and Toradol  Home Medications   Prior to Admission medications   Medication Sig Start Date End Date Taking? Authorizing Provider  DULoxetine (CYMBALTA) 60 MG capsule Take 60 mg by mouth daily.     Historical Provider, MD  levonorgestrel (MIRENA) 20 MCG/24HR IUD 1 each by Intrauterine route once. 2010    Historical Provider, MD   BP 115/71 mmHg  Pulse 107  Temp(Src) 98.3 F (36.8 C) (Oral)  Resp 16  Ht 5\' 1"  (1.549 m)  Wt 102.059 kg  BMI 42.54 kg/m2  SpO2 99% Physical Exam  Constitutional: She appears well-developed and well-nourished. No distress.  HENT:  Head: Normocephalic and atraumatic.   Mouth/Throat: Oropharynx is clear and moist. No oropharyngeal exudate.  Eyes: Conjunctivae are normal. Pupils are equal, round, and reactive to light. Right eye exhibits no discharge. Left eye exhibits no discharge. No scleral icterus.  Neck: Normal range of motion. Neck supple. No thyromegaly present.  Cardiovascular: Normal rate, regular rhythm, normal heart sounds and intact distal pulses.  Exam reveals no gallop and no friction rub.   No murmur heard. Pulmonary/Chest: Effort normal and breath sounds normal. No stridor. No respiratory distress. She has no wheezes. She has no rales.  Abdominal: Soft. Bowel sounds are normal. She exhibits no distension. There is tenderness in the right upper quadrant, right lower quadrant and periumbilical area. There is no rebound and no guarding.  Musculoskeletal: She exhibits no edema.  Lymphadenopathy:    She has no cervical adenopathy.  Neurological: She is alert. Coordination normal.  Skin: Skin is warm and dry. No rash noted. She is not diaphoretic.  No pallor.  Psychiatric: She has a normal mood and affect.  Nursing note and vitals reviewed.   ED Course  Procedures (including critical care time)  DIAGNOSTIC STUDIES: Oxygen Saturation is 100% on RA, normal by my interpretation.    COORDINATION OF CARE: 9:45 PM Discussed treatment plan with pt at bedside and pt agreed to plan.  Labs Review Labs Reviewed  URINALYSIS, ROUTINE W REFLEX MICROSCOPIC (NOT AT [facility]) - Abnormal; Notable for the following:    APPearance CLOUDY (*)    Hgb urine dipstick SMALL (*)    Bilirubin Urine SMALL (*)    Ketones, ur 15 (*)    Leukocytes, UA MODERATE (*)    All other components within normal limits  URINE MICROSCOPIC-ADD ON - Abnormal; Notable for the following:    Squamous Epithelial / LPF 6-30 (*)    Bacteria, UA FEW (*)    All other components within normal limits  COMPREHENSIVE METABOLIC PANEL - Abnormal; Notable for the following:    Potassium 3.4 (*)     Glucose, Bld 153 (*)    AST 42 (*)    All other components within normal limits  CBC WITH DIFFERENTIAL/PLATELET - Abnormal; Notable for the following:    Monocytes Absolute 1.1 (*)    All other components within normal limits  URINE CULTURE  PREGNANCY, URINE  OCCULT BLOOD X 1 CARD TO LAB, STOOL  LIPASE, BLOOD  CBC WITH DIFFERENTIAL/PLATELET    Imaging Review Ct Abdomen Pelvis W Contrast  11/27/2015  CLINICAL DATA:  Periumbilical pain with nausea and vomiting. Diarrhea with blood in stool. EXAM: CT ABDOMEN AND PELVIS WITH CONTRAST TECHNIQUE: Multidetector CT imaging of the abdomen and pelvis was performed using the standard protocol following bolus administration of intravenous contrast. CONTRAST:  ISOVUE-300 IOPAMIDOL (ISOVUE-300) INJECTION 61% COMPARISON:  04/13/2014 FINDINGS: Lower chest and abdominal wall: Abdominal wall scarring without acute finding. Hepatobiliary: No focal liver abnormality.Cholecystectomy. No common bile duct dilatation. Pancreas: Unremarkable. Spleen: Unremarkable. Adrenals/Urinary Tract: Negative adrenals. No hydronephrosis or stone. Unremarkable bladder. Stomach/Bowel: Small bowel anastomosis in the right abdomen. Appendectomy. No bowel obstruction or inflammatory wall thickening. Reproductive:IUD in good position. Left-sided corpus luteum. Surgically absent right ovary. Vascular/Lymphatic: No acute vascular abnormality. No mass or adenopathy. Other: Small pelvic fluid which could be physiologic. Musculoskeletal: Negative. IMPRESSION: 1. No acute finding. 2. Left corpus luteum and small volume pelvic fluid. Electronically Signed   By: Marnee Spring M.D.   On: 11/27/2015 02:45   I have personally reviewed and evaluated these images and lab results as part of my medical decision-making.   EKG Interpretation None      0300 Patient given morphine, Dilaudid without any pain relief. Patient found some relief with fentanyl 50 mg. Patient found no relief of  nausea and dry heaving with Zofran or Phenergan. Patient continues to have pain and nausea.  MDM   The patient with extensive history of small bowel obstructions. CBC unremarkable. CMP shows K 3.4, AST 42. Lipase 28. Occult negative. UA shows small hematuria, small bilirubin, moderate leukocytes, few bacteria, 6-30 squamous epithelial cells. Patient with urinary symptoms and will treat with Keflex. CT abdomen pelvis shows no acute finding. However due to intractable pain and vomiting and extensive SBO history, I would like to consult medicine to admit the patient. However, patient would really not like to be admitted and agreed to try PO challenge. At shift change, Dr. Read Drivers will assume care and  after PO challenge speak with hospitalist and determine disposition if  PO challenge failed.  Final diagnoses:  Abdominal pain, unspecified abdominal location  Intractable vomiting with nausea, vomiting of unspecified type    I personally performed the services described in this documentation, which was scribed in my presence. The recorded information has been reviewed and is accurate.   [address]Alexandra M Rowene Suto, PA-C 11/27/15 [id number]   Rolan BuccoMelanie Belfi, MD 11/29/15 1534

## 2015-11-27 MED ORDER — FENTANYL CITRATE (PF) 100 MCG/2ML IJ SOLN
50.0000 ug | Freq: Once | INTRAMUSCULAR | Status: AC
  Administered 2015-11-27: 50 ug via INTRAVENOUS
  Filled 2015-11-27: qty 2

## 2015-11-27 MED ORDER — HYDROCODONE-ACETAMINOPHEN 5-325 MG PO TABS: 1.0000 | ORAL_TABLET | ORAL | Status: DC | PRN

## 2015-11-27 MED ORDER — IOPAMIDOL (ISOVUE-300) INJECTION 61%
100.0000 mL | Freq: Once | INTRAVENOUS | Status: AC | PRN
  Administered 2015-11-27: 100 mL via INTRAVENOUS

## 2015-11-27 MED ORDER — PROMETHAZINE HCL 25 MG PO TABS: 25.0000 mg | ORAL_TABLET | Freq: Four times a day (QID) | ORAL | Status: DC | PRN

## 2015-11-27 MED ORDER — CEPHALEXIN 500 MG PO CAPS: 500.0000 mg | ORAL_CAPSULE | Freq: Two times a day (BID) | ORAL | Status: DC

## 2015-11-27 MED ORDER — PROMETHAZINE HCL 25 MG/ML IJ SOLN
12.5000 mg | Freq: Once | INTRAMUSCULAR | Status: AC
  Administered 2015-11-27: 12.5 mg via INTRAVENOUS
  Filled 2015-11-27: qty 1

## 2015-11-27 MED ORDER — DIPHENHYDRAMINE HCL 50 MG/ML IJ SOLN
12.5000 mg | Freq: Once | INTRAMUSCULAR | Status: AC
  Administered 2015-11-27: 12.5 mg via INTRAVENOUS
  Filled 2015-11-27: qty 1

## 2015-11-27 MED ORDER — CEPHALEXIN 250 MG PO CAPS: 500.0000 mg | ORAL_CAPSULE | Freq: Once | ORAL | Status: DC

## 2015-11-27 NOTE — ED Notes (Signed)
Pt able to hold down PO fluids and ice chips, states she would like to go home.

## 2015-11-27 NOTE — ED Notes (Signed)
Provider aware that pt is still c/o pain and nausea

## 2015-11-27 NOTE — ED Notes (Signed)
Pt transported to CT ?

## 2015-11-27 NOTE — Discharge Instructions (Signed)
Medications: Phenergan, Norco  Treatment: Take Phenergan every 6 hours as needed for nausea or vomiting. Take Norco as prescribed for severe pain. Do not drive or operate machinery when taking this medication.  Follow-up: Please follow-up with your primary care provider and your gastroenterologist for further evaluation and treatment of your symptoms. Please return to emergency department if you develop any new or worsening symptoms.   Abdominal Pain, Adult Many things can cause abdominal pain. Usually, abdominal pain is not caused by a disease and will improve without treatment. It can often be observed and treated at home. Your health care provider will do a physical exam and possibly order blood tests and X-rays to help determine the seriousness of your pain. However, in many cases, more time must pass before a clear cause of the pain can be found. Before that point, your health care provider may not know if you need more testing or further treatment. HOME CARE INSTRUCTIONS Monitor your abdominal pain for any changes. The following actions may help to alleviate any discomfort you are experiencing:  Only take over-the-counter or prescription medicines as directed by your health care provider.  Do not take laxatives unless directed to do so by your health care provider.  Try a clear liquid diet (broth, tea, or water) as directed by your health care provider. Slowly move to a bland diet as tolerated. SEEK MEDICAL CARE IF:  You have unexplained abdominal pain.  You have abdominal pain associated with nausea or diarrhea.  You have pain when you urinate or have a bowel movement.  You experience abdominal pain that wakes you in the night.  You have abdominal pain that is worsened or improved by eating food.  You have abdominal pain that is worsened with eating fatty foods.  You have a fever. SEEK IMMEDIATE MEDICAL CARE IF:  Your pain does not go away within 2 hours.  You keep throwing  up (vomiting).  Your pain is felt only in portions of the abdomen, such as the right side or the left lower portion of the abdomen.  You pass bloody or black tarry stools. MAKE SURE YOU:  Understand these instructions.  Will watch your condition.  Will get help right away if you are not doing well or get worse.   This information is not intended to replace advice given to you by your health care provider. Make sure you discuss any questions you have with your health care provider.   Document Released: 02/01/2005 Document Revised: 01/13/2015 Document Reviewed: 01/01/2013 Elsevier Interactive Patient Education [employer]

## 2015-11-27 NOTE — ED Notes (Signed)
Pt verbalizes understanding of d.c instructions and denies any further needs at this time.  She is ambulatory to lobby to wait for mom to pick her up.  She states mom is a few minutes away.

## 2015-11-29 LAB — URINE CULTURE
Culture: >=100000 — AB
Special Requests: NORMAL

## 2015-11-30 ENCOUNTER — Telehealth (HOSPITAL_BASED_OUTPATIENT_CLINIC_OR_DEPARTMENT_OTHER): Payer: Self-pay | Admitting: Emergency Medicine

## 2015-11-30 NOTE — Telephone Encounter (Signed)
Post ED Visit - Positive Culture Follow-up  Culture report reviewed by antimicrobial stewardship pharmacist:  []  Enzo Bi, Pharm.D. []  Celedonio Miyamoto, Pharm.D., BCPS []  Garvin Fila, Pharm.D. []  Georgina Pillion, Pharm.D., BCPS []  [place], [address].D., BCPS, AAHIVP [x]  Estella Husk, Pharm.D., BCPS, AAHIVP []  Tennis Must, Pharm.D. []  Sherle Poe, [address].D.  Positive urine culture Treated with cephalexin, organism sensitive to the same and no further patient follow-up is required at this time.  Berle Mull 11/30/2015, 11:13 AM

## 2015-12-16 ENCOUNTER — Encounter (HOSPITAL_BASED_OUTPATIENT_CLINIC_OR_DEPARTMENT_OTHER): Payer: Self-pay | Admitting: *Deleted

## 2015-12-16 ENCOUNTER — Inpatient Hospital Stay (HOSPITAL_BASED_OUTPATIENT_CLINIC_OR_DEPARTMENT_OTHER)
Admission: EM | Admit: 2015-12-16 | Discharge: 2015-12-22 | DRG: 390 | Disposition: A | Discharge status: Home / Self Care | Payer: 59 | Attending: Internal Medicine | Admitting: Internal Medicine

## 2015-12-16 ENCOUNTER — Emergency Department (HOSPITAL_BASED_OUTPATIENT_CLINIC_OR_DEPARTMENT_OTHER): Payer: 59

## 2015-12-16 DIAGNOSIS — R112 Nausea with vomiting, unspecified: Secondary | ICD-10-CM

## 2015-12-16 DIAGNOSIS — K219 Gastro-esophageal reflux disease without esophagitis: Secondary | ICD-10-CM | POA: Diagnosis not present

## 2015-12-16 DIAGNOSIS — Z765 Malingerer [conscious simulation]: Secondary | ICD-10-CM | POA: Diagnosis not present

## 2015-12-16 DIAGNOSIS — R062 Wheezing: Secondary | ICD-10-CM

## 2015-12-16 DIAGNOSIS — K5660 Unspecified intestinal obstruction: Principal | ICD-10-CM | POA: Diagnosis present

## 2015-12-16 DIAGNOSIS — F329 Major depressive disorder, single episode, unspecified: Secondary | ICD-10-CM | POA: Diagnosis not present

## 2015-12-16 DIAGNOSIS — K565 Intestinal adhesions [bands] with obstruction (postprocedural) (postinfection): Secondary | ICD-10-CM | POA: Diagnosis not present

## 2015-12-16 DIAGNOSIS — Z0189 Encounter for other specified special examinations: Secondary | ICD-10-CM

## 2015-12-16 DIAGNOSIS — R000 Tachycardia, unspecified: Secondary | ICD-10-CM | POA: Diagnosis not present

## 2015-12-16 DIAGNOSIS — Z981 Arthrodesis status: Secondary | ICD-10-CM

## 2015-12-16 DIAGNOSIS — R109 Unspecified abdominal pain: Secondary | ICD-10-CM

## 2015-12-16 DIAGNOSIS — N898 Other specified noninflammatory disorders of vagina: Secondary | ICD-10-CM | POA: Diagnosis present

## 2015-12-16 DIAGNOSIS — K560 Paralytic ileus: Secondary | ICD-10-CM | POA: Diagnosis present

## 2015-12-16 DIAGNOSIS — R739 Hyperglycemia, unspecified: Secondary | ICD-10-CM

## 2015-12-16 DIAGNOSIS — R6510 Systemic inflammatory response syndrome (SIRS) of non-infectious origin without acute organ dysfunction: Secondary | ICD-10-CM | POA: Diagnosis not present

## 2015-12-16 DIAGNOSIS — Z79899 Other long term (current) drug therapy: Secondary | ICD-10-CM

## 2015-12-16 DIAGNOSIS — R1084 Generalized abdominal pain: Secondary | ICD-10-CM

## 2015-12-16 DIAGNOSIS — Z9049 Acquired absence of other specified parts of digestive tract: Secondary | ICD-10-CM

## 2015-12-16 DIAGNOSIS — G43809 Other migraine, not intractable, without status migrainosus: Secondary | ICD-10-CM | POA: Diagnosis not present

## 2015-12-16 DIAGNOSIS — E669 Obesity, unspecified: Secondary | ICD-10-CM | POA: Diagnosis present

## 2015-12-16 DIAGNOSIS — B373 Candidiasis of vulva and vagina: Secondary | ICD-10-CM | POA: Diagnosis present

## 2015-12-16 DIAGNOSIS — Z888 Allergy status to other drugs, medicaments and biological substances status: Secondary | ICD-10-CM

## 2015-12-16 DIAGNOSIS — K56609 Unspecified intestinal obstruction, unspecified as to partial versus complete obstruction: Secondary | ICD-10-CM

## 2015-12-16 DIAGNOSIS — G43909 Migraine, unspecified, not intractable, without status migrainosus: Secondary | ICD-10-CM | POA: Diagnosis not present

## 2015-12-16 DIAGNOSIS — F32A Depression, unspecified: Secondary | ICD-10-CM | POA: Diagnosis present

## 2015-12-16 LAB — URINALYSIS, ROUTINE W REFLEX MICROSCOPIC
Bilirubin Urine: NEGATIVE
Glucose, UA: NEGATIVE mg/dL
Hgb urine dipstick: NEGATIVE
Ketones, ur: 40 mg/dL — AB
NITRITE: NEGATIVE
PH: 5.5 (ref 5.0–8.0)
Protein, ur: NEGATIVE mg/dL
SPECIFIC GRAVITY, URINE: 1.026 (ref 1.005–1.030)

## 2015-12-16 LAB — COMPREHENSIVE METABOLIC PANEL
ALT: 37 U/L (ref 14–54)
AST: 23 U/L (ref 15–41)
Albumin: 4.6 g/dL (ref 3.5–5.0)
Alkaline Phosphatase: 58 U/L (ref 38–126)
Anion gap: 10 (ref 5–15)
BILIRUBIN TOTAL: 0.8 mg/dL (ref 0.3–1.2)
BUN: 11 mg/dL (ref 6–20)
CHLORIDE: 107 mmol/L (ref 101–111)
CO2: 21 mmol/L — ABNORMAL LOW (ref 22–32)
CREATININE: 0.59 mg/dL (ref 0.44–1.00)
Calcium: 9.3 mg/dL (ref 8.9–10.3)
Glucose, Bld: 131 mg/dL — ABNORMAL HIGH (ref 65–99)
Potassium: 3.6 mmol/L (ref 3.5–5.1)
Sodium: 138 mmol/L (ref 135–145)
TOTAL PROTEIN: 7.6 g/dL (ref 6.5–8.1)

## 2015-12-16 LAB — URINE MICROSCOPIC-ADD ON

## 2015-12-16 LAB — CBC WITH DIFFERENTIAL/PLATELET
BASOS ABS: 0 10*3/uL (ref 0.0–0.1)
Basophils Relative: 0 %
EOS ABS: 0.2 10*3/uL (ref 0.0–0.7)
EOS PCT: 2 %
HEMATOCRIT: 38.9 % (ref 36.0–46.0)
Hemoglobin: 13.5 g/dL (ref 12.0–15.0)
Lymphocytes Relative: 25 %
Lymphs Abs: 2.5 10*3/uL (ref 0.7–4.0)
MCH: 30.9 pg (ref 26.0–34.0)
MCHC: 34.7 g/dL (ref 30.0–36.0)
MCV: 89 fL (ref 78.0–100.0)
Monocytes Absolute: 0.7 10*3/uL (ref 0.1–1.0)
Monocytes Relative: 7 %
Neutro Abs: 6.7 10*3/uL (ref 1.7–7.7)
Neutrophils Relative %: 66 %
PLATELETS: 412 10*3/uL — AB (ref 150–400)
RBC: 4.37 MIL/uL (ref 3.87–5.11)
RDW: 12.7 % (ref 11.5–15.5)
WBC: 10 10*3/uL (ref 4.0–10.5)

## 2015-12-16 LAB — PREGNANCY, URINE: PREG TEST UR: NEGATIVE

## 2015-12-16 LAB — RAPID URINE DRUG SCREEN, HOSP PERFORMED
AMPHETAMINES: NOT DETECTED
BARBITURATES: NOT DETECTED
BENZODIAZEPINES: NOT DETECTED
COCAINE: NOT DETECTED
OPIATES: NOT DETECTED
TETRAHYDROCANNABINOL: NOT DETECTED

## 2015-12-16 LAB — LIPASE, BLOOD: LIPASE: 35 U/L (ref 11–51)

## 2015-12-16 MED ORDER — HYDROMORPHONE HCL 1 MG/ML IJ SOLN
1.0000 mg | Freq: Once | INTRAMUSCULAR | Status: AC
  Administered 2015-12-16: 1 mg via INTRAVENOUS
  Filled 2015-12-16: qty 1

## 2015-12-16 MED ORDER — SODIUM CHLORIDE 0.9 % IV SOLN
INTRAVENOUS | Status: DC
  Administered 2015-12-16: 23:00:00 via INTRAVENOUS

## 2015-12-16 MED ORDER — PROMETHAZINE HCL 25 MG/ML IJ SOLN
25.0000 mg | Freq: Once | INTRAMUSCULAR | Status: AC
  Administered 2015-12-16: 25 mg via INTRAVENOUS
  Filled 2015-12-16: qty 1

## 2015-12-16 MED ORDER — DIPHENHYDRAMINE HCL 50 MG/ML IJ SOLN
25.0000 mg | Freq: Once | INTRAMUSCULAR | Status: AC
  Administered 2015-12-16: 25 mg via INTRAVENOUS
  Filled 2015-12-16: qty 1

## 2015-12-16 MED ORDER — SODIUM CHLORIDE 0.9 % IV BOLUS (SEPSIS)
1000.0000 mL | Freq: Once | INTRAVENOUS | Status: AC
  Administered 2015-12-16: 1000 mL via INTRAVENOUS

## 2015-12-16 MED ORDER — FENTANYL CITRATE (PF) 100 MCG/2ML IJ SOLN
50.0000 ug | Freq: Once | INTRAMUSCULAR | Status: AC
  Administered 2015-12-16: 50 ug via INTRAVENOUS
  Filled 2015-12-16: qty 2

## 2015-12-16 MED ORDER — LORAZEPAM 2 MG/ML IJ SOLN
1.0000 mg | Freq: Once | INTRAMUSCULAR | Status: AC
  Administered 2015-12-16: 1 mg via INTRAVENOUS
  Filled 2015-12-16: qty 1

## 2015-12-16 NOTE — ED Provider Notes (Signed)
  Physical Exam  BP 118/84 (BP Location: Left Arm)   Pulse 106   Temp 97.7 F (36.5 C) (Oral)   Resp 18   Ht 5\' 2"  (1.575 m)   Wt 102.1 kg   SpO2 100%   BMI 41.15 kg/m   Physical Exam  ED Course  Procedures Sign out from Lame DeerEmily [place], PA-C  MDM N/V. Last BM 2-3 days ago. No Flatus. Hx SBO due to multiple abdominal surgeries.  [facility]  Pt requests no CT imaging due to radiation   UDS Pending Upright XR Pending  5:42 PM- UDS negative. Given Benadryl, Fentanyl.   6:42 PM- DG Uprigth Abdomen with possible early SBO. Will place NG tube and admit to medicine under observation. Pt agrees with plan         Audry Piliyler Rollin Kotowski, PA-C 12/16/15 1935    Nelva Nayobert Beaton, MD 12/17/15 [phone]

## 2015-12-16 NOTE — ED Notes (Signed)
Pt requesting another dose of fentanyl. Joselyn Glassmanyler PA made aware.

## 2015-12-16 NOTE — Progress Notes (Signed)
Admission note:  Arrival Method: [facility] Mental Orientation: Alert and oriented x 4 Telemetry: N/A Assessment: Completed, see flowsheets Skin: Dry and intact IV: Left wrist Pain: Pt states pain is 7/10 in her abdomen  Tubes: Left nair NG tube  Safety Measures: Bed in lowest position, non-slip socks placed, call light within reach Fall Prevention Safety Plan: Reviewed with patient Admission Screening: Completed. Unable to complete Abuse/Neglect portion at this time due to family being present.  6700 Orientation: Patient has been oriented to the unit, staff and to the room. Orders have been reviewed and implemented. MD paged and made aware of patient's arrival to the unit. Family is at bedside. Call light within reach, will continue to monitor the patient closely.   Feliciana RossettiLaura Maeley Matton, RN, BSN

## 2015-12-16 NOTE — ED Notes (Signed)
Called [facility] for repage on hospitalist ay 19:04.

## 2015-12-16 NOTE — ED Triage Notes (Signed)
Lower abdominal pain with radiation into her back. Vomiting. Has gotten worse throughout the day.

## 2015-12-16 NOTE — ED Provider Notes (Signed)
[facility] Note   CSN: [id number] Arrival date & time: 12/16/15  1344  First Provider Contact:  First MD Initiated Contact with Patient 12/16/15 1434        History   Chief Complaint Chief Complaint  Patient presents with  . Abdominal Pain    HPI Loretta Alexander is a 35 y.o. female.  HPI   Pt with hx SBO, UTI, kidney stones, gastritis, ovarian cysts and multiple abdominal surgeries (s/p cholecystectomy, appendectomy, unilateral salpingoophorectomy, bowel resection, multiple lysis of adhesion surgeries) p/w diffuse, L>R abdominal pain and pressure, abdominal bloating, vomiting that began today.  States she is unable to urinate, last urinated at 8am.  Last BM 2-3 days ago, denies being able to pass flatus.  Denies fevers, vaginal symptoms.  Abdominal surgeries have occurred mostly at [facility], most recently at [facility] while on vacation.   Recent UTI grew pansensitive e.coli, pt finished course of Keflex.  Also recent admission for SBO at [facility] per patient.   Past Medical History:  Diagnosis Date  . Complication of anesthesia    Difficult IV stick with Central line placement, TACHY 180-190 S/P COLON SURGERY (resolved with treatment for pain)   . Depression   . Drug-seeking behavior   . Dysrhythmia    INCREASED HR AT TIMES  . Eye infection    within last 2 weeks  . Gastritis 03/2011   Per EGD  . GERD (gastroesophageal reflux disease)    OCC TUMS  . Intestinal obstruction (HCC)   . Migraines   . Obesity   . Ovarian cyst, right    Hx of oophorectomy for adhesions  . Pneumonia    04/2010  . Renal disorder    Kidney Stones  . Thyroid nodule   . UTI (lower urinary tract infection)     Patient Active Problem List   Diagnosis Date Noted  . Dysphagia 05/09/2015  . Neck swelling 05/08/2015  . S/P cervical spinal fusion 04/28/2015  . Migraine headache 01/20/2013  . Diplopia 01/20/2013  . Left-sided weakness 01/20/2013  . Right sided abdominal pain  01/11/2012  . Hyperglycemia 01/11/2012  . Drug-seeking behavior 01/11/2012  . Obesity 01/11/2012  . Constipation 01/11/2012  . Depression 08/31/2011  . Gastritis 04/06/2011  . Abdominal pain 04/03/2011  . Foreign body 04/03/2011  . Nausea and vomiting 04/03/2011    Past Surgical History:  Procedure Laterality Date  . ABDOMINAL ADHESION SURGERY  2006   BIL FALLOPIAN TUBES REMOVED  . ANTERIOR CERVICAL DECOMP/DISCECTOMY FUSION N/A 04/28/2015   Procedure: Cervical five-six, Cervical six - seven Anterior cervical decompression/diskectomy/fusion;  Surgeon: Tia Alert, MD;  Location: [facility] NEURO ORS;  Service: Neurosurgery;  Laterality: N/A;  C5-6 C6-7 Anterior cervical decompression/diskectomy/fusion  . APPENDECTOMY  1992  . CHOLECYSTECTOMY  2003  . COLON SURGERY     MAY 2015     BOWEL OBST s/p partial bowel resection [facility])  . ESOPHAGOGASTRODUODENOSCOPY  04/05/2011   Procedure: ESOPHAGOGASTRODUODENOSCOPY (EGD);  Surgeon: Charna Elizabeth, MD;  Location: [facility] ENDOSCOPY;  Service: Endoscopy;  Laterality: N/A;  . HERNIA REPAIR     umbilical  . KNEE ARTHROSCOPY  2001, 2010   left knee x 2  . LAPAROSCOPIC GASTRIC BANDING    . NASAL SEPTOPLASTY W/ TURBINOPLASTY  03/16/2011   Procedure: NASAL SEPTOPLASTY WITH TURBINATE REDUCTION;  Surgeon: Susy Frizzle, MD;  Location: [facility] OR;  Service: ENT;  Laterality: Bilateral;  . PILONIDAL CYST EXCISION    . removal of lap band    .  right ovary removed  2006  . TONSILLECTOMY  1991    OB History    No data available       Home Medications    Prior to Admission medications   Medication Sig Start Date End Date Taking? Authorizing Provider  cephALEXin (KEFLEX) 500 MG capsule Take 1 capsule (500 mg total) by mouth 2 (two) times daily. 11/27/15   John Molpus, MD  DULoxetine (CYMBALTA) 60 MG capsule Take 60 mg by mouth daily.     Historical Provider, MD  HYDROcodone-acetaminophen (NORCO/VICODIN) 5-325 MG tablet Take 1 tablet by mouth every 4 (four)  hours as needed for severe pain. 11/27/15   Emi Holes, PA-C  levonorgestrel (MIRENA) 20 MCG/24HR IUD 1 each by Intrauterine route once. 2010    Historical Provider, MD  promethazine (PHENERGAN) 25 MG tablet Take 1 tablet (25 mg total) by mouth every 6 (six) hours as needed for nausea or vomiting. 11/27/15   Emi Holes, PA-C    Family History No family history on file.  Social History Social History  Substance Use Topics  . Smoking status: Never Smoker  . Smokeless tobacco: Never Used  . Alcohol use Yes     Comment: rarely     Allergies   Prochlorperazine maleate; Compazine [prochlorperazine maleate]; Haloperidol and related; Reglan [metoclopramide hcl]; and Toradol [ketorolac tromethamine]   Review of Systems Review of Systems  All other systems reviewed and are negative.    Physical Exam Updated Vital Signs BP 118/84 (BP Location: Left Arm)   Pulse 106   Temp 97.7 F (36.5 C) (Oral)   Resp 18   Ht 5\' 2"  (1.575 m)   Wt 102.1 kg   SpO2 100%   BMI 41.15 kg/m   Physical Exam  Constitutional: She appears well-developed and well-nourished. No distress.  HENT:  Head: Normocephalic and atraumatic.  Neck: Neck supple.  Cardiovascular: Normal rate and regular rhythm.   Pulmonary/Chest: Effort normal and breath sounds normal. No respiratory distress. She has no wheezes. She has no rales.  Abdominal: Soft. There is tenderness (diffuse, L>R). There is no rebound and no guarding.  obese  Neurological: She is alert.  Skin: She is not diaphoretic.  Nursing note and vitals reviewed.    ED Treatments / Results  Labs (all labs ordered are listed, but only abnormal results are displayed) Labs Reviewed  URINALYSIS, ROUTINE W REFLEX MICROSCOPIC (NOT AT [facility]) - Abnormal; Notable for the following:       Result Value   APPearance TURBID (*)    Ketones, ur 40 (*)    Leukocytes, UA SMALL (*)    All other components within normal limits  CBC WITH DIFFERENTIAL/PLATELET  - Abnormal; Notable for the following:    Platelets 412 (*)    All other components within normal limits  COMPREHENSIVE METABOLIC PANEL - Abnormal; Notable for the following:    CO2 21 (*)    Glucose, Bld 131 (*)    All other components within normal limits  URINE MICROSCOPIC-ADD ON - Abnormal; Notable for the following:    Squamous Epithelial / LPF 0-5 (*)    Bacteria, UA MANY (*)    Casts HYALINE CASTS (*)    All other components within normal limits  URINE CULTURE  PREGNANCY, URINE  LIPASE, BLOOD  URINE RAPID DRUG SCREEN, HOSP PERFORMED    EKG  EKG Interpretation None       Radiology No results found.  Procedures Procedures (including critical care time)  Medications  Ordered in ED Medications  sodium chloride 0.9 % bolus 1,000 mL (1,000 mLs Intravenous New Bag/Given 12/16/15 1609)  HYDROmorphone (DILAUDID) injection 1 mg (1 mg Intravenous Given 12/16/15 1611)  diphenhydrAMINE (BENADRYL) injection 25 mg (25 mg Intravenous Given 12/16/15 1611)  promethazine (PHENERGAN) injection 25 mg (25 mg Intravenous Given 12/16/15 1610)  HYDROmorphone (DILAUDID) injection 1 mg (1 mg Intravenous Given 12/16/15 1651)  promethazine (PHENERGAN) injection 25 mg (25 mg Intravenous Given 12/16/15 1651)     Initial Impression / Assessment and Plan / ED Course  I have reviewed the triage vital signs and the nursing notes.  Pertinent labs & imaging results that were available during my care of the patient were reviewed by me and considered in my medical decision making (see chart for details).  Clinical Course    Afebrile nontoxic patient with complex abdominal surgical history and hx UTI, SBO p/w abdominal pain, N/V, difficulty urinating, no BM x 2-3 days.  States this feels like prior SBO.  Pt requested not to have additional CT scan imaging to avoid radiation if possible.  Labs remarkable for slight hyperglycemia.  UA with many bacteria, few WBC - will send for culture.  Xray pending at  change of shift - pt initially unable to tolerate change in position.  Discussed pt with Dr Radford PaxBeaton and Audry Piliyler Mohr, PA-C.  PA Mohr to assume care of patient at change of shift pending xray, drug screen, symptomatic treatment and reassessment.     Final Clinical Impressions(s) / ED Diagnoses   Final diagnoses:  None    New Prescriptions New Prescriptions   No medications on file     Trixie Dredgemily Aleana Fifita, PA-C 12/16/15 1706    Vanetta MuldersScott Zackowski, MD 12/18/15 2141

## 2015-12-16 NOTE — ED Notes (Signed)
Report given to Vernona RiegerLaura on 6 E

## 2015-12-17 ENCOUNTER — Inpatient Hospital Stay (HOSPITAL_COMMUNITY): Payer: 59

## 2015-12-17 DIAGNOSIS — K5900 Constipation, unspecified: Secondary | ICD-10-CM | POA: Diagnosis not present

## 2015-12-17 DIAGNOSIS — B373 Candidiasis of vulva and vagina: Secondary | ICD-10-CM | POA: Diagnosis present

## 2015-12-17 DIAGNOSIS — R112 Nausea with vomiting, unspecified: Secondary | ICD-10-CM | POA: Diagnosis not present

## 2015-12-17 DIAGNOSIS — R1084 Generalized abdominal pain: Secondary | ICD-10-CM

## 2015-12-17 DIAGNOSIS — K219 Gastro-esophageal reflux disease without esophagitis: Secondary | ICD-10-CM | POA: Diagnosis present

## 2015-12-17 DIAGNOSIS — Z888 Allergy status to other drugs, medicaments and biological substances status: Secondary | ICD-10-CM | POA: Diagnosis not present

## 2015-12-17 DIAGNOSIS — K560 Paralytic ileus: Secondary | ICD-10-CM | POA: Diagnosis present

## 2015-12-17 DIAGNOSIS — R109 Unspecified abdominal pain: Secondary | ICD-10-CM | POA: Diagnosis present

## 2015-12-17 DIAGNOSIS — Z765 Malingerer [conscious simulation]: Secondary | ICD-10-CM | POA: Diagnosis not present

## 2015-12-17 DIAGNOSIS — R000 Tachycardia, unspecified: Secondary | ICD-10-CM | POA: Diagnosis not present

## 2015-12-17 DIAGNOSIS — Z981 Arthrodesis status: Secondary | ICD-10-CM | POA: Diagnosis not present

## 2015-12-17 DIAGNOSIS — K565 Intestinal adhesions [bands] with obstruction (postprocedural) (postinfection): Secondary | ICD-10-CM | POA: Diagnosis not present

## 2015-12-17 DIAGNOSIS — F329 Major depressive disorder, single episode, unspecified: Secondary | ICD-10-CM | POA: Diagnosis not present

## 2015-12-17 DIAGNOSIS — K5660 Unspecified intestinal obstruction: Secondary | ICD-10-CM | POA: Diagnosis present

## 2015-12-17 DIAGNOSIS — G43909 Migraine, unspecified, not intractable, without status migrainosus: Secondary | ICD-10-CM | POA: Diagnosis not present

## 2015-12-17 DIAGNOSIS — Z79899 Other long term (current) drug therapy: Secondary | ICD-10-CM | POA: Diagnosis not present

## 2015-12-17 DIAGNOSIS — G43809 Other migraine, not intractable, without status migrainosus: Secondary | ICD-10-CM | POA: Diagnosis not present

## 2015-12-17 DIAGNOSIS — N390 Urinary tract infection, site not specified: Secondary | ICD-10-CM | POA: Diagnosis not present

## 2015-12-17 DIAGNOSIS — N898 Other specified noninflammatory disorders of vagina: Secondary | ICD-10-CM | POA: Diagnosis not present

## 2015-12-17 DIAGNOSIS — R6510 Systemic inflammatory response syndrome (SIRS) of non-infectious origin without acute organ dysfunction: Secondary | ICD-10-CM | POA: Diagnosis not present

## 2015-12-17 DIAGNOSIS — Z9049 Acquired absence of other specified parts of digestive tract: Secondary | ICD-10-CM | POA: Diagnosis not present

## 2015-12-17 DIAGNOSIS — E669 Obesity, unspecified: Secondary | ICD-10-CM | POA: Diagnosis present

## 2015-12-17 LAB — URINE CULTURE

## 2015-12-17 LAB — CBC
HCT: 38.5 % (ref 36.0–46.0)
Hemoglobin: 12.7 g/dL (ref 12.0–15.0)
MCH: 30.5 pg (ref 26.0–34.0)
MCHC: 33 g/dL (ref 30.0–36.0)
MCV: 92.3 fL (ref 78.0–100.0)
PLATELETS: 386 10*3/uL (ref 150–400)
RBC: 4.17 MIL/uL (ref 3.87–5.11)
RDW: 13.3 % (ref 11.5–15.5)
WBC: 9.4 10*3/uL (ref 4.0–10.5)

## 2015-12-17 LAB — WET PREP, GENITAL
Clue Cells Wet Prep HPF POC: NONE SEEN
Sperm: NONE SEEN
TRICH WET PREP: NONE SEEN
Yeast Wet Prep HPF POC: NONE SEEN

## 2015-12-17 LAB — PROTIME-INR
INR: 1.04
PROTHROMBIN TIME: 13.6 s (ref 11.4–15.2)

## 2015-12-17 LAB — BASIC METABOLIC PANEL
Anion gap: 10 (ref 5–15)
BUN: 7 mg/dL (ref 6–20)
CALCIUM: 8.8 mg/dL — AB (ref 8.9–10.3)
CO2: 23 mmol/L (ref 22–32)
CREATININE: 0.57 mg/dL (ref 0.44–1.00)
Chloride: 107 mmol/L (ref 101–111)
GFR calc non Af Amer: >60 mL/min (ref >60)
Glucose, Bld: 119 mg/dL — ABNORMAL HIGH (ref 65–99)
Potassium: 3.9 mmol/L (ref 3.5–5.1)
SODIUM: 140 mmol/L (ref 135–145)

## 2015-12-17 MED ORDER — POTASSIUM CHLORIDE IN NACL 20-0.9 MEQ/L-% IV SOLN
INTRAVENOUS | Status: DC
  Administered 2015-12-17 – 2015-12-22 (×10): via INTRAVENOUS
  Filled 2015-12-17 (×16): qty 1000

## 2015-12-17 MED ORDER — PHENOL 1.4 % MT LIQD
1.0000 | OROMUCOSAL | Status: DC | PRN
  Filled 2015-12-17 (×2): qty 177

## 2015-12-17 MED ORDER — SODIUM CHLORIDE 0.9 % IV SOLN
8.0000 mg | Freq: Three times a day (TID) | INTRAVENOUS | Status: DC | PRN
  Filled 2015-12-17: qty 4

## 2015-12-17 MED ORDER — BUTAMBEN-TETRACAINE-BENZOCAINE 2-2-14 % EX AERO
1.0000 | INHALATION_SPRAY | Freq: Four times a day (QID) | CUTANEOUS | Status: DC | PRN
  Filled 2015-12-17: qty 20

## 2015-12-17 MED ORDER — PROMETHAZINE HCL 25 MG/ML IJ SOLN
25.0000 mg | Freq: Four times a day (QID) | INTRAMUSCULAR | Status: DC | PRN
Start: 2015-12-17 — End: 2015-12-22
  Administered 2015-12-17 – 2015-12-22 (×20): 25 mg via INTRAVENOUS
  Filled 2015-12-17 (×21): qty 1

## 2015-12-17 MED ORDER — HYDROMORPHONE HCL 1 MG/ML IJ SOLN
1.0000 mg | INTRAMUSCULAR | Status: DC | PRN
  Administered 2015-12-17 – 2015-12-18 (×8): 2 mg via INTRAVENOUS
  Administered 2015-12-18: 1 mg via INTRAVENOUS
  Filled 2015-12-17 (×9): qty 2

## 2015-12-17 MED ORDER — HYDROMORPHONE HCL 1 MG/ML IJ SOLN
1.0000 mg | INTRAMUSCULAR | Status: DC | PRN
  Administered 2015-12-17: 2 mg via INTRAVENOUS
  Filled 2015-12-17: qty 2

## 2015-12-17 MED ORDER — HEPARIN SODIUM (PORCINE) 5000 UNIT/ML IJ SOLN
5000.0000 [IU] | Freq: Three times a day (TID) | INTRAMUSCULAR | Status: DC
  Administered 2015-12-17 – 2015-12-22 (×16): 5000 [IU] via SUBCUTANEOUS
  Filled 2015-12-17 (×15): qty 1

## 2015-12-17 MED ORDER — LORAZEPAM 2 MG/ML IJ SOLN: 1.0000 mg | Freq: Two times a day (BID) | INTRAMUSCULAR | Status: DC | PRN

## 2015-12-17 MED ORDER — BENZOCAINE-MENTHOL 20-0.5 % EX AERO
1.0000 "application " | INHALATION_SPRAY | Freq: Four times a day (QID) | CUTANEOUS | Status: DC | PRN
  Filled 2015-12-17: qty 56

## 2015-12-17 MED ORDER — DIPHENHYDRAMINE HCL 50 MG/ML IJ SOLN
25.0000 mg | Freq: Four times a day (QID) | INTRAMUSCULAR | Status: DC | PRN
  Administered 2015-12-17: 25 mg via INTRAVENOUS
  Filled 2015-12-17: qty 1

## 2015-12-17 MED ORDER — FLUCONAZOLE IN SODIUM CHLORIDE 200-0.9 MG/100ML-% IV SOLN
150.0000 mg | Freq: Once | INTRAVENOUS | Status: AC
  Administered 2015-12-17: 150 mg via INTRAVENOUS
  Filled 2015-12-17: qty 75

## 2015-12-17 MED ORDER — PROMETHAZINE HCL 25 MG/ML IJ SOLN: 25.0000 mg | INTRAMUSCULAR | Status: DC | PRN

## 2015-12-17 MED ORDER — DIATRIZOATE MEGLUMINE & SODIUM 66-10 % PO SOLN
90.0000 mL | Freq: Once | ORAL | Status: AC
  Administered 2015-12-17: 90 mL via NASOGASTRIC
  Filled 2015-12-17: qty 90

## 2015-12-17 MED ORDER — DIPHENHYDRAMINE HCL 50 MG/ML IJ SOLN
25.0000 mg | Freq: Four times a day (QID) | INTRAMUSCULAR | Status: DC | PRN
  Administered 2015-12-17 – 2015-12-21 (×15): 25 mg via INTRAVENOUS
  Filled 2015-12-17 (×16): qty 1
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
## 2015-12-17 NOTE — Progress Notes (Signed)
Patient vomited x 2, notified Dr. Melynda RippleHobbs and also General surgery who are following her, Dr. Harden MoMatt Wakefield was okay to give Gastrogrifin.

## 2015-12-17 NOTE — H&P (Signed)
[facility] History and Physical  Loretta Alexander ZOX:[id number] DOB: Jun 05, 1980 DOA: 12/16/2015  Referring physician: Marlise Eves, PA PCP: No PCP Per Patient   Chief Complaint: Abd pain , nausea/ vomiting  HPI: Loretta Alexander is a 35 y.o. female with history of depression, UTI and multiple abd surgeries presented to outside facility with c/o mid abdominal pain, nausea and vomiting for the last 4 days.  And no BM or flatus for the last 4 days.  She was seen here on 7/22 and CT at that time showed no obstructing process.    Today the plain abd xray reads >                                "Mildly dilated gas fluid level containing small bowel loops are seenin the right lower quadrant of the abdomen, which likely correspondto the distal ileum anastomotic bowel loops, which in correlation to previous CT demonstrate relatively stable appearance, considering cross- imaging comparison. Alternatively, early small bowel obstruction may have a similar appearance."  CT not down due to pt's concern about excess radiation.    Patient surgical history >>   Date  Place  Description 2003  [facility] Gallbladder removal 2005  [place] Rupt appendix surgery 2006-09  [facility]  3 separate surgeries including LOA during 2 of them, removal bilat     fallopian tubes and removal R ovary 2016  [facility] While on vacation - had small bowel obstruction and twisted large     intestine, per pt she lost 8 inches of colon at surgery  Patient grew up in [place].  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  Married, has 3 stepchildren.  Lives in [place], no tobacco, social etoh.   PCP was Dr Andrey Campanile in [facility], now is supposed to see Dr Tally Joe since she is now with [facility].    ROS  denies CP  no joint pain   no HA  no blurry vision  no rash  no diarrhea  no nausea/ vomiting  no dysuria  no difficulty voiding  no change in urine color     Past Medical History  Past Medical History:  Diagnosis Date  . Complication of anesthesia    Difficult IV stick with Central line placement, TACHY 180-190 S/P COLON SURGERY (resolved with treatment for pain)   . Depression   . Drug-seeking behavior   . Dysrhythmia    INCREASED HR AT TIMES  . Eye infection    within last 2 weeks  . Gastritis 03/2011   Per EGD  . GERD (gastroesophageal reflux disease)    OCC TUMS  . Intestinal obstruction ([facility])   . Migraines   . Obesity   . Ovarian cyst, right    Hx of oophorectomy for adhesions  . Pneumonia    04/2010  . Renal disorder    Kidney Stones  . Thyroid nodule   . UTI (lower urinary tract infection)    Past Surgical History  Past Surgical History:  Procedure Laterality Date  . ABDOMINAL ADHESION SURGERY  2006   BIL FALLOPIAN TUBES REMOVED  . ANTERIOR CERVICAL DECOMP/DISCECTOMY FUSION N/A 04/28/2015   Procedure: Cervical five-six, Cervical six - seven Anterior cervical decompression/diskectomy/fusion;  Surgeon: Tia Alert, MD;  Location: [facility];  Service: Neurosurgery;  Laterality: N/A;  C5-6 C6-7 Anterior cervical decompression/diskectomy/fusion  . APPENDECTOMY  1992  . CHOLECYSTECTOMY  2003  . COLON SURGERY     MAY 2015     BOWEL OBST s/p partial bowel resection [facility])  . ESOPHAGOGASTRODUODENOSCOPY  04/05/2011   Procedure: ESOPHAGOGASTRODUODENOSCOPY (EGD);  Surgeon: Charna Elizabeth, MD;  Location[facility];  Service: Endoscopy;  Laterality: N/A;  . HERNIA REPAIR     umbilical  . KNEE ARTHROSCOPY  2001, 2010   left knee x 2  . LAPAROSCOPIC GASTRIC BANDING    . NASAL SEPTOPLASTY W/ TURBINOPLASTY  03/16/2011   Procedure: NASAL SEPTOPLASTY WITH TURBINATE REDUCTION;  Surgeon: Susy Frizzle, MD;  Location: [facility];  Service: ENT;  Laterality: Bilateral;  . PILONIDAL CYST EXCISION    . removal of lap band    . right ovary removed  2006  . TONSILLECTOMY  1991   Family History No family history on file. Social  History  reports that she has never smoked. She has never used smokeless tobacco. She reports that she drinks alcohol. She reports that she does not use drugs. Allergies  Allergies  Allergen Reactions  . Prochlorperazine Maleate Other (See Comments)    Patient can tolerate phenergan Major anxiety attack  . Compazine [Prochlorperazine Maleate] Anxiety and Other (See Comments)    dizziness  . Haloperidol And Related Anxiety and Other (See Comments)    dizziness  . Reglan [Metoclopramide Hcl] Anxiety and Other (See Comments)    dizziness  . Toradol [Ketorolac Tromethamine] Nausea And Vomiting   Home medications Prior to Admission medications   Medication Sig Start Date End Date Taking? Authorizing Provider  cephALEXin (KEFLEX) 500 MG capsule Take 1 capsule (500 mg total) by mouth 2 (two) times daily. 11/27/15   John Molpus, MD  DULoxetine (CYMBALTA) 60 MG capsule Take 60 mg by mouth daily.     Historical Provider, MD  HYDROcodone-acetaminophen (NORCO/VICODIN) 5-325 MG tablet Take 1 tablet by mouth every 4 (four) hours as needed for severe pain. 11/27/15   Emi Holes, PA-C  levonorgestrel (MIRENA) 20 MCG/24HR IUD 1 each by Intrauterine route once. 2010    Historical Provider, MD  promethazine (PHENERGAN) 25 MG tablet Take 1 tablet (25 mg total) by mouth every 6 (six) hours as needed for nausea or vomiting. 11/27/15   Emi Holes, PA-C   Liver Function Tests  Recent Labs Lab 12/16/15 1550  AST 23  ALT 37  ALKPHOS 58  BILITOT 0.8  PROT 7.6  ALBUMIN 4.6    Recent Labs Lab 12/16/15 1550  LIPASE 35   CBC  Recent Labs Lab 12/16/15 1550  WBC 10.0  NEUTROABS 6.7  HGB 13.5  HCT 38.9  MCV 89.0  PLT 412*   Basic Metabolic Panel  Recent Labs Lab 12/16/15 1550  NA 138  K 3.6  CL 107  CO2 21*  GLUCOSE 131*  BUN 11  CREATININE 0.59  CALCIUM 9.3     Vitals:   12/16/15 1845 12/16/15 2030 12/16/15 2100 12/16/15 2241  BP: 125/85 110/78 93/80 124/76  Pulse: (!)  125 115 118 (!) 110  Resp: Temp: 98.2 F (36.8 C)   98.5 F (36.9 C)  TempSrc: Oral   Oral  SpO2: 98% 96% 98% 98%  Weight:    104.1 kg (229 lb 8 oz)  Height:     (1.575 m)   Exam: Gen pleasant obese WF, no distress, NG in place No rash,  cyanosis or gangrene Sclera anicteric, throat clear  No jvd or bruits Chest clear bilat RRR no MRG Abd soft obese, tender mid abd and LLQ, dec'd BS, no rebound, no hs GU deferred MS no joint effusions or deformity Ext no LE edema / no wounds or ulcers Neuro is alert, Ox 3 , nf   Na 138  K 3.6  BUn 11 Cr 0.59   Glu 131  Alb 4.6  LFT's ok WBC 10k  Hb 13  plt 412 UA - negative Urine preg negative UDS - negative  2-view xray abd > Mildly dilated gas fluid level containing small bowel loops are seen in the right lower quadrant of the abdomen, which likely correspond to the distal ileum anastomotic bowel loops, which in correlation to previous CT demonstrate relatively stable appearance, considering cross imaging comparison. Alternatively, early small bowel obstruction may have a similar appearance.   Assessment: 1.  Abd pain / nausea/ vomiting - possible recurrent bowel obstruction.  Hx of repeated LOA and prior abd surgeries.  Will admit, NG, NPO, IVF, will need to call surg consult in am Friday. 2.  Hx prior LOA's / abd surgeries- [place], says the CT "misses the bowel obstruction" lately    Plan - as above     Maree Alexander,Loretta Sarris D [facility] Pager [phone]  Cell [phone]  If 7PM-7AM, please contact night-coverage [email or web address] Password [email or web address] 12/17/2015, 12:18 AM

## 2015-12-17 NOTE — Progress Notes (Signed)
Patient complaining of increased pain and requesting pain medication frequency to be increased. Craige CottaKirby, NP notified and stated that pain medication will not be increased at this time. Patient made aware. Will continue to monitor the patient closely.   Feliciana RossettiLaura Ia Leeb, RN, BSN

## 2015-12-17 NOTE — Progress Notes (Signed)
[facility] PROGRESS NOTE  Loretta Alexander ZOX:[id number] DOB: August 04, 1980 DOA: 12/16/2015 PCP: No PCP Per Patient  Assessment/Plan:  1.  Abd pain / nausea/ vomiting - possible recurrent bowel obstruction.  Hx of repeated LOA and prior abd surgeries.   - NGT w/ minimal output -  NPO, IVF, getting MRI abd - requesting outside hosp records - Will call gen surg so they know about patient. Pt's abd exam is unimpressive.  2.  Hx prior LOA's / abd surgeries- had 8 in colon resected in [facility] last year, says the CT "misses the bowel obstruction" lately, which does not make sense for diagnosis of SBO. Review of records in [facility] shows multiple complaints of N/V with neg abd CT.  3. Depression - no SI/HI - holding cymbalta  4. Hx of drug seeking behvaior - limit escalation of pain meds until definitive pathology found   Consultants:  none  Procedures:  none  Antibiotics:  none  HPI/Subjective: Pt asking for dilaudid and benadryl as a cocktail.  No vomiting just dry heaves. Pt states hx of extravasation with phenergan.  Objective: Vitals:   12/17/15 0521 12/17/15 0940  BP: 108/85 124/77  Pulse: (!) 103 96  Resp: 16 16  Temp: 98 F (36.7 C) 98.6 F (37 C)    Intake/Output Summary (Last 24 hours) at 12/17/15 1029 Last data filed at 12/17/15 0930  Gross per 24 hour  Intake           608.34 ml  Output               75 ml  Net           533.34 ml   Filed Weights   12/16/15 1349 12/16/15 2241  Weight: 102.1 kg (225 lb) 104.1 kg (229 lb 8 oz)    Exam:   General:  No diaphoresis, anxious, no acute distress  Cardiovascular: Regular rate and rhythm no murmurs rubs or gallops  Respiratory: Clear to auscultation bilaterally no more breathing  Abdomen: Nondistended bowel sounds normal nontender palpation  Musculoskeletal: Moving all extremities, no deformity, 5 out of 5 strength   Data Reviewed: Basic Metabolic Panel:  Recent Labs Lab 12/16/15 1550  12/17/15 0427  NA 138 140  K 3.6 3.9  CL 107 107  CO2 21* 23  GLUCOSE 131* 119*  BUN 11 7  CREATININE 0.59 0.57  CALCIUM 9.3 8.8*   Liver Function Tests:  Recent Labs Lab 12/16/15 1550  AST 23  ALT 37  ALKPHOS 58  BILITOT 0.8  PROT 7.6  ALBUMIN 4.6    Recent Labs Lab 12/16/15 1550  LIPASE 35   No results for input(s): AMMONIA in the last 168 hours. CBC:  Recent Labs Lab 12/16/15 1550 12/17/15 0427  WBC 10.0 9.4  NEUTROABS 6.7  --   HGB 13.5 12.7  HCT 38.9 38.5  MCV 89.0 92.3  PLT 412* 386   Cardiac Enzymes: No results for input(s): CKTOTAL, CKMB, CKMBINDEX, TROPONINI in the last 168 hours. BNP (last 3 results) No results for input(s): BNP in the last 8760 hours.  ProBNP (last 3 results) No results for input(s): PROBNP in the last 8760 hours.  CBG: No results for input(s): GLUCAP in the last 168 hours.  No results found for this or any previous visit (from the past 240 hour(s)).   Studies: Dg Abd 2 Views  Addendum Date: 12/16/2015   ADDENDUM REPORT: 12/16/2015 18:10 ADDENDUM: Mildly dilated gas fluid level containing small bowel loops  are seen in the right lower quadrant of the abdomen, which likely correspond to the distal ileum anastomotic bowel loops, which in correlation to previous CT demonstrate relatively stable appearance, considering cross imaging comparison. Alternatively, early small bowel obstruction may have a similar appearance. Electronically Signed   By: Ted Mcalpineobrinka  Dimitrova M.D.   On: 12/16/2015 18:10   Result Date: 12/16/2015 CLINICAL DATA:  Lower abdominal pain and vomiting. History of recent small bowel obstruction. EXAM: ABDOMEN - 2 VIEW COMPARISON:  None. FINDINGS: The bowel gas pattern is normal. There is no evidence of free air. No radio-opaque calculi or other significant radiographic abnormality is seen. IMPRESSION: Negative. Electronically Signed: By: Ted Mcalpineobrinka  Dimitrova M.D. On: 12/16/2015 17:47    Scheduled Meds: . heparin   5,000 Units Subcutaneous Q8H   Continuous Infusions: . 0.9 % NaCl with KCl 20 mEq / L 100 mL/hr at 12/17/15 0136    Principal Problem:   Bowel obstruction (HCC) Active Problems:   Abdominal pain   Nausea with vomiting   Depression    Time spent: 6335    Haydee SalterPhillip M Reign Bartnick  [facility] Pager Amion. If 7PM-7AM, please contact night-coverage at [email or web address], password [email or web address] 12/17/2015, 10:29 AM  LOS: 0 days

## 2015-12-18 ENCOUNTER — Inpatient Hospital Stay (HOSPITAL_COMMUNITY): Payer: 59

## 2015-12-18 DIAGNOSIS — N898 Other specified noninflammatory disorders of vagina: Secondary | ICD-10-CM | POA: Diagnosis present

## 2015-12-18 DIAGNOSIS — G43809 Other migraine, not intractable, without status migrainosus: Secondary | ICD-10-CM

## 2015-12-18 LAB — CBC WITH DIFFERENTIAL/PLATELET
BASOS ABS: 0 10*3/uL (ref 0.0–0.1)
Basophils Relative: 0 %
EOS PCT: 1 %
Eosinophils Absolute: 0.1 10*3/uL (ref 0.0–0.7)
HEMATOCRIT: 37.7 % (ref 36.0–46.0)
Hemoglobin: 13.3 g/dL (ref 12.0–15.0)
LYMPHS PCT: 15 %
Lymphs Abs: 2.2 10*3/uL (ref 0.7–4.0)
MCH: 31.7 pg (ref 26.0–34.0)
MCHC: 35.3 g/dL (ref 30.0–36.0)
MCV: 90 fL (ref 78.0–100.0)
MONO ABS: 1.1 10*3/uL — AB (ref 0.1–1.0)
MONOS PCT: 7 %
NEUTROS ABS: 11.5 10*3/uL — AB (ref 1.7–7.7)
Neutrophils Relative %: 77 %
PLATELETS: 338 10*3/uL (ref 150–400)
RBC: 4.19 MIL/uL (ref 3.87–5.11)
RDW: 13.1 % (ref 11.5–15.5)
WBC: 15 10*3/uL — ABNORMAL HIGH (ref 4.0–10.5)

## 2015-12-18 LAB — BASIC METABOLIC PANEL
ANION GAP: 11 (ref 5–15)
CALCIUM: 8.9 mg/dL (ref 8.9–10.3)
CO2: 20 mmol/L — AB (ref 22–32)
CREATININE: 0.47 mg/dL (ref 0.44–1.00)
Chloride: 103 mmol/L (ref 101–111)
GFR calc Af Amer: >60 mL/min (ref >60)
GLUCOSE: 97 mg/dL (ref 65–99)
Potassium: 4.5 mmol/L (ref 3.5–5.1)
Sodium: 134 mmol/L — ABNORMAL LOW (ref 135–145)

## 2015-12-18 MED ORDER — SODIUM CHLORIDE 0.9% FLUSH
10.0000 mL | INTRAVENOUS | Status: DC | PRN
  Administered 2015-12-19 – 2015-12-20 (×3): 10 mL
  Filled 2015-12-18 (×3): qty 40

## 2015-12-18 MED ORDER — SODIUM CHLORIDE 0.9 % IV BOLUS (SEPSIS)
1000.0000 mL | Freq: Once | INTRAVENOUS | Status: AC
  Administered 2015-12-18: 1000 mL via INTRAVENOUS

## 2015-12-18 MED ORDER — LORAZEPAM 2 MG/ML IJ SOLN
1.0000 mg | Freq: Once | INTRAMUSCULAR | Status: AC
  Administered 2015-12-18: 1 mg via INTRAVENOUS
  Filled 2015-12-18: qty 1

## 2015-12-18 MED ORDER — ASPIRIN 600 MG RE SUPP
900.0000 mg | Freq: Once | RECTAL | Status: AC
  Administered 2015-12-18: 900 mg via RECTAL
  Filled 2015-12-18: qty 1

## 2015-12-18 MED ORDER — HYDROMORPHONE HCL 1 MG/ML IJ SOLN
1.0000 mg | Freq: Four times a day (QID) | INTRAMUSCULAR | Status: DC | PRN
  Administered 2015-12-18 – 2015-12-21 (×12): 2 mg via INTRAVENOUS
  Filled 2015-12-18 (×12): qty 2

## 2015-12-18 NOTE — Progress Notes (Signed)
Patient is requesting to have her NG tube removed.  She had 300 mL out put from  8am-10am & 25 mL since 10am.  Dr. Melynda RippleHobbs notified.

## 2015-12-18 NOTE — Progress Notes (Signed)
Peripherally Inserted Central Catheter/Midline Placement  The IV Nurse has discussed with the patient and/or persons authorized to consent for the patient, the purpose of this procedure and the potential benefits and risks involved with this procedure.  The benefits include less needle sticks, lab draws from the catheter, ability to perform PICC exchange if ordered by the physician and patient may be discharged home with the catheter.  Risks include, but not limited to, infection, bleeding, blood clot (thrombus formation), and puncture of an artery; nerve damage and irregular heart beat.  Alternatives to this procedure were also discussed.  Bard educational packet given to pt.  PICC/Midline Placement Documentation  PICC Single Lumen 12/18/15 PICC Right Basilic 37 cm 0 cm (Active)  Indication for Insertion or Continuance of Line Prolonged intravenous therapies;Limited venous access - need for IV therapy >5 days (PICC only);Poor Vasculature-patient has had multiple peripheral attempts or PIVs lasting less than 24 hours 12/18/2015  4:32 PM  Exposed Catheter (cm) 0 cm 12/18/2015  4:32 PM  Site Assessment Clean;Dry;Intact 12/18/2015  4:32 PM  Line Status Flushed;Saline locked;Blood return noted 12/18/2015  4:32 PM  Dressing Type Transparent 12/18/2015  4:32 PM  Dressing Status Clean;Dry;Intact;Antimicrobial disc in place 12/18/2015  4:32 PM  Line Care Connections checked and tightened 12/18/2015  4:32 PM  Line Adjustment (NICU/IV Team Only) No 12/18/2015  4:32 PM  Dressing Intervention New dressing 12/18/2015  4:32 PM  Dressing Change Due 12/25/15 12/18/2015  4:32 PM       Elliot Dallyiggs, Manami Tutor Wright 12/18/2015, 4:32 PM

## 2015-12-18 NOTE — Progress Notes (Signed)
Dear Doctor:   This patient has been identified as a candidate for PICC for the following reason (s): IV therapy over 48 hours, poor veins/poor circulatory system (CHF, COPD, emphysema, diabetes, steroid use, IV drug abuse, etc.) and restarts due to phlebitis and infiltration in 24 hours If you agree, please write an order for the indicated device. For any questions contact the Vascular Access Team at [phone] if no answer, please leave a message.  Thank you for supporting the early vascular access assessment program. 

## 2015-12-18 NOTE — Progress Notes (Addendum)
[facility] PROGRESS NOTE  LEINAALA CATANESE ZOX:[id number] DOB: [birth year] DOA: 12/16/2015 PCP: No PCP Per Patient  Assessment/Plan:  1.  Abd pain / nausea/ vomiting DDX partial small bowel obstruction versus ileus versus gastroparesis with ileus - Hx of repeated LOA and prior abd surgeries.   - NGT w/ minimal output -  NPO, IVF, getting MRI abd - requesting outside hosp records-->pending - Surgery team following recommendations appreciated -Frequency of pain medications decrease as it may be contributing to the issue Pt's abd exam is unimpressive again today   2.  Hx prior LOA's / abd surgeries- had 8 in colon resected in [facility] last year, says the CT "misses the bowel obstruction" lately, which does not make sense for diagnosis of SBO. Review of records in EPIC shows multiple complaints of N/V with neg abd CT.  3. Depression - no SI/HI - holding cymbalta  4. Hx of drug seeking behvaior - limit escalation of pain meds until definitive pathology found  5. Migraines -Patient has history of migraines and states that Phenergan combined with a lot of it is very effective. Has told patient numerous times that this combination is not able to be given. Discussed with pharmacy and due to patient's numerous allergies she'll benefit from Ativan plus aspirin rectal.  6. Vaginal discharge -Patient was diagnosed UTI in late July and bowel antibiotics completed course Patient then developed a vaginal yeast infection and be given herself vaginal cream prior to admission - Speculum exam revealed a significant amount of mucopurulent discharge from the cervical os. No blood. No lesions. No cervical motion tenderness or uterine tenderness. GC and chlamydia sent and is pending. Wet prep negative. Patient did get 150 mg IV of Diflucan to finish up her treatment for her vaginal candidiasis  Consultants:  General surgery  Procedures:  none  Antibiotics:  Diflucan  8/12  HPI/Subjective: Patient with vomiting 3 overnight. NG tube in place. Patient complain a migraine headache.  Objective: Vitals:   12/18/15 0900 12/18/15 1718  BP: 117/79 137/86  Pulse: (!) 104 (!) 112  Resp: 18 18  Temp: 98.8 F (37.1 C) 99.6 F (37.6 C)    Intake/Output Summary (Last 24 hours) at 12/18/15 1719 Last data filed at 12/18/15 1400  Gross per 24 hour  Intake              800 ml  Output             1105 ml  Net             -305 ml   Filed Weights   12/16/15 1349 12/16/15 2241  Weight: 102.1 kg (225 lb) 104.1 kg (229 lb 8 oz)    Exam:   General:  No diaphoresis, anxious, no acute distress, NG tube in place  Cardiovascular: Regular rate and rhythm no murmurs rubs or gallops  Respiratory: Clear to auscultation bilaterally no more breathing  Abdomen: Nondistended bowel sounds normal nontender palpation  Musculoskeletal: Moving all extremities, no deformity, 5 out of 5 strength   Data Reviewed: Basic Metabolic Panel:  Recent Labs Lab 12/16/15 1550 12/17/15 0427 12/18/15 1057  NA 138 140 134*  K 3.6 3.9 4.5  CL 107 107 103  CO2 21* 23 20*  GLUCOSE 131* 119* 97  BUN 11 7 <5*  CREATININE 0.59 0.57 0.47  CALCIUM 9.3 8.8* 8.9   Liver Function Tests:  Recent Labs Lab 12/16/15 1550  AST 23  ALT 37  ALKPHOS 58  BILITOT 0.8  PROT 7.6  ALBUMIN 4.6    Recent Labs Lab 12/16/15 1550  LIPASE 35   No results for input(s): AMMONIA in the last 168 hours. CBC:  Recent Labs Lab 12/16/15 1550 12/17/15 0427 12/18/15 1057  WBC 10.0 9.4 15.0*  NEUTROABS 6.7  --  11.5*  HGB 13.5 12.7 13.3  HCT 38.9 38.5 37.7  MCV 89.0 92.3 90.0  PLT 412* 386 338   Cardiac Enzymes: No results for input(s): CKTOTAL, CKMB, CKMBINDEX, TROPONINI in the last 168 hours. BNP (last 3 results) No results for input(s): BNP in the last 8760 hours.  ProBNP (last 3 results) No results for input(s): PROBNP in the last 8760 hours.  CBG: No results for input(s):  GLUCAP in the last 168 hours.  Recent Results (from the past 240 hour(s))  Urine culture     Status: Abnormal   Collection Time: 12/16/15  2:33 PM  Result Value Ref Range Status   Specimen Description URINE, CLEAN CATCH  Final   Special Requests NONE  Final   Culture MULTIPLE SPECIES PRESENT, SUGGEST RECOLLECTION (A)  Final   Report Status 12/17/2015 FINAL  Final  Wet prep, genital     Status: Abnormal   Collection Time: 12/17/15  4:40 PM  Result Value Ref Range Status   Yeast Wet Prep HPF POC NONE SEEN NONE SEEN Final    Comment: SPECIMEN SUBMITTED IN 2ML OF SALINE, RESULTS MAY BE AFFWCTED.   Trich, Wet Prep NONE SEEN NONE SEEN Final   Clue Cells Wet Prep HPF POC NONE SEEN NONE SEEN Final   WBC, Wet Prep HPF POC FEW (A) NONE SEEN Final   Sperm NONE SEEN  Final     Studies: Dg Abd 2 Views  Addendum Date: 12/16/2015   ADDENDUM REPORT: 12/16/2015 18:10 ADDENDUM: Mildly dilated gas fluid level containing small bowel loops are seen in the right lower quadrant of the abdomen, which likely correspond to the distal ileum anastomotic bowel loops, which in correlation to previous CT demonstrate relatively stable appearance, considering cross imaging comparison. Alternatively, early small bowel obstruction may have a similar appearance. Electronically Signed   By: Ted Mcalpineobrinka  Dimitrova M.D.   On: 12/16/2015 18:10   Result Date: 12/16/2015 CLINICAL DATA:  Lower abdominal pain and vomiting. History of recent small bowel obstruction. EXAM: ABDOMEN - 2 VIEW COMPARISON:  None. FINDINGS: The bowel gas pattern is normal. There is no evidence of free air. No radio-opaque calculi or other significant radiographic abnormality is seen. IMPRESSION: Negative. Electronically Signed: By: Ted Mcalpineobrinka  Dimitrova M.D. On: 12/16/2015 17:47   Dg Abd Portable 1v-small Bowel Obstruction Protocol-initial, 8 Hr Delay  Result Date: 12/18/2015 CLINICAL DATA:  8 hour radiograph, small-bowel obstruction protocol. Assess  progression of contrast. Initial encounter. EXAM: PORTABLE ABDOMEN - 1 VIEW COMPARISON:  Abdominal radiograph performed 12/17/2015 FINDINGS: Contrast has progressed to the level of the ascending and transverse colon. Residual mildly distended small bowel loops are still seen. Residual contrast is also seen in the stomach. No free intra-abdominal air is identified, though evaluation for free air is limited on a single supine view. An intrauterine device is noted overlying the mid pelvis. The visualized osseous structures are within normal limits; the sacroiliac joints are unremarkable in appearance. IMPRESSION: 1. Contrast progresses to the colon, without evidence for significant obstruction. 2. Residual mildly distended small bowel loops may reflect some degree of small bowel dysmotility. Residual contrast is also noted in the stomach, concerning for some degree of gastroparesis. Electronically Signed   By:  Roanna Raider M.D.   On: 12/18/2015 05:23   Dg Abd Portable 1v  Result Date: 12/17/2015 CLINICAL DATA:  NG tube placement. EXAM: PORTABLE ABDOMEN - 1 VIEW COMPARISON:  12/16/2015 FINDINGS: The NG tube tip is in the body region of the stomach. Unremarkable bowel gas pattern. IMPRESSION: NG tube tip in the body region of stomach. Electronically Signed   By: Rudie Meyer M.D.   On: 12/17/2015 14:28    Scheduled Meds: . aspirin  900 mg Rectal Once  . heparin  5,000 Units Subcutaneous Q8H  . LORazepam  1 mg Intravenous Once   Continuous Infusions: . 0.9 % NaCl with KCl 20 mEq / L 100 mL/hr at 12/18/15 1113    Principal Problem:   Bowel obstruction (HCC) Active Problems:   Abdominal pain   Nausea with vomiting   Depression    Time spent: 30    Haydee Salter  [facility] Pager Amion. If 7PM-7AM, please contact night-coverage at [email or web address], password [facility] 12/18/2015, 5:19 PM  LOS: 1 day

## 2015-12-18 NOTE — Progress Notes (Signed)
Subjective: Still with cramping abdominal pain and some dysuria NG adjusted and now a large amount out Denies flatus  Objective: Vital signs in last 24 hours: Temp:  [98.6 F (37 C)-99.8 F (37.7 C)] 98.8 F (37.1 C) (08/12 0900) Pulse Rate:  [96-107] 104 (08/12 0900) Resp:  [16-18] 18 (08/12 0900) BP: (112-129)/(67-83) 117/79 (08/12 0900) SpO2:  [96 %-99 %] 97 % (08/12 0900) Last BM Date: 12/13/15  Intake/Output from previous day: 08/11 0701 - 08/12 0700 In: 150 [NG/GT:150] Out: 980 [Urine:790; Emesis/NG output:190] Intake/Output this shift: Total I/O In: -  Out: 400 [Urine:400]  Exam: Abdomen soft, non distended, mild diffuse tenderness  Lab Results:   Recent Labs  12/16/15 1550 12/17/15 0427  WBC 10.0 9.4  HGB 13.5 12.7  HCT 38.9 38.5  PLT 412* 386   BMET  Recent Labs  12/16/15 1550 12/17/15 0427  NA 138 140  K 3.6 3.9  CL 107 107  CO2 21* 23  GLUCOSE 131* 119*  BUN 11 7  CREATININE 0.59 0.57  CALCIUM 9.3 8.8*   PT/INR  Recent Labs  12/17/15 0427  LABPROT 13.6  INR 1.04   ABG No results for input(s): PHART, HCO3 in the last 72 hours.  Invalid input(s): PCO2, PO2  Studies/Results: Dg Abd 2 Views  Addendum Date: 12/16/2015   ADDENDUM REPORT: 12/16/2015 18:10 ADDENDUM: Mildly dilated gas fluid level containing small bowel loops are seen in the right lower quadrant of the abdomen, which likely correspond to the distal ileum anastomotic bowel loops, which in correlation to previous CT demonstrate relatively stable appearance, considering cross imaging comparison. Alternatively, early small bowel obstruction may have a similar appearance. Electronically Signed   By: Ted Mcalpine M.D.   On: 12/16/2015 18:10   Result Date: 12/16/2015 CLINICAL DATA:  Lower abdominal pain and vomiting. History of recent small bowel obstruction. EXAM: ABDOMEN - 2 VIEW COMPARISON:  None. FINDINGS: The bowel gas pattern is normal. There is no evidence of free  air. No radio-opaque calculi or other significant radiographic abnormality is seen. IMPRESSION: Negative. Electronically Signed: By: Ted Mcalpine M.D. On: 12/16/2015 17:47   Dg Abd Portable 1v-small Bowel Obstruction Protocol-initial, 8 Hr Delay  Result Date: 12/18/2015 CLINICAL DATA:  8 hour radiograph, small-bowel obstruction protocol. Assess progression of contrast. Initial encounter. EXAM: PORTABLE ABDOMEN - 1 VIEW COMPARISON:  Abdominal radiograph performed 12/17/2015 FINDINGS: Contrast has progressed to the level of the ascending and transverse colon. Residual mildly distended small bowel loops are still seen. Residual contrast is also seen in the stomach. No free intra-abdominal air is identified, though evaluation for free air is limited on a single supine view. An intrauterine device is noted overlying the mid pelvis. The visualized osseous structures are within normal limits; the sacroiliac joints are unremarkable in appearance. IMPRESSION: 1. Contrast progresses to the colon, without evidence for significant obstruction. 2. Residual mildly distended small bowel loops may reflect some degree of small bowel dysmotility. Residual contrast is also noted in the stomach, concerning for some degree of gastroparesis. Electronically Signed   By: Roanna Raider M.D.   On: 12/18/2015 05:23   Dg Abd Portable 1v  Result Date: 12/17/2015 CLINICAL DATA:  NG tube placement. EXAM: PORTABLE ABDOMEN - 1 VIEW COMPARISON:  12/16/2015 FINDINGS: The NG tube tip is in the body region of the stomach. Unremarkable bowel gas pattern. IMPRESSION: NG tube tip in the body region of stomach. Electronically Signed   By: Rudie Meyer M.D.   On: 12/17/2015 14:28  Anti-infectives: Anti-infectives    Start     Dose/Rate Route Frequency Ordered Stop   12/17/15 1400  fluconazole (DIFLUCAN) IVPB 150 mg     150 mg 75 mL/hr over 60 Minutes Intravenous  Once 12/17/15 1348 12/17/15 1622      Assessment/Plan:  SBO vs  Ileus  Small bowel protocol shows contrast in the colon today and no evidence of an obstruction. Some contrast is still in the stomach. Suspect ileus but given her history of SBO's, still could be a partial obstruction.  Will leave NG and repeat films in the morning  LOS: 1 day    Loretta Alexander A 12/18/2015

## 2015-12-18 NOTE — Progress Notes (Signed)
Patient has agreed to leave NG tube in place.

## 2015-12-19 ENCOUNTER — Inpatient Hospital Stay (HOSPITAL_COMMUNITY): Payer: 59

## 2015-12-19 DIAGNOSIS — R6510 Systemic inflammatory response syndrome (SIRS) of non-infectious origin without acute organ dysfunction: Secondary | ICD-10-CM

## 2015-12-19 LAB — CBC WITH DIFFERENTIAL/PLATELET
BASOS ABS: 0 10*3/uL (ref 0.0–0.1)
BASOS PCT: 0 %
EOS PCT: 0 %
Eosinophils Absolute: 0.1 10*3/uL (ref 0.0–0.7)
HEMATOCRIT: 38 % (ref 36.0–46.0)
Hemoglobin: 12.5 g/dL (ref 12.0–15.0)
LYMPHS ABS: 1.6 10*3/uL (ref 0.7–4.0)
LYMPHS PCT: 10 %
MCH: 30.3 pg (ref 26.0–34.0)
MCHC: 32.9 g/dL (ref 30.0–36.0)
MCV: 92.2 fL (ref 78.0–100.0)
MONO ABS: 1.3 10*3/uL — AB (ref 0.1–1.0)
Monocytes Relative: 8 %
NEUTROS ABS: 13.7 10*3/uL — AB (ref 1.7–7.7)
Neutrophils Relative %: 82 %
PLATELETS: 356 10*3/uL (ref 150–400)
RBC: 4.12 MIL/uL (ref 3.87–5.11)
RDW: 12.8 % (ref 11.5–15.5)
WBC: 16.8 10*3/uL — ABNORMAL HIGH (ref 4.0–10.5)

## 2015-12-19 LAB — COMPREHENSIVE METABOLIC PANEL
ALBUMIN: 3.7 g/dL (ref 3.5–5.0)
ALT: 51 U/L (ref 14–54)
AST: 25 U/L (ref 15–41)
Alkaline Phosphatase: 63 U/L (ref 38–126)
Anion gap: 14 (ref 5–15)
CHLORIDE: 101 mmol/L (ref 101–111)
CO2: 21 mmol/L — AB (ref 22–32)
Calcium: 8.8 mg/dL — ABNORMAL LOW (ref 8.9–10.3)
Creatinine, Ser: 0.59 mg/dL (ref 0.44–1.00)
GFR calc Af Amer: >60 mL/min (ref >60)
GFR calc non Af Amer: >60 mL/min (ref >60)
GLUCOSE: 118 mg/dL — AB (ref 65–99)
POTASSIUM: 3.6 mmol/L (ref 3.5–5.1)
Sodium: 136 mmol/L (ref 135–145)
Total Bilirubin: 1.7 mg/dL — ABNORMAL HIGH (ref 0.3–1.2)
Total Protein: 6.8 g/dL (ref 6.5–8.1)

## 2015-12-19 LAB — URINALYSIS, ROUTINE W REFLEX MICROSCOPIC
Bilirubin Urine: NEGATIVE
Glucose, UA: NEGATIVE mg/dL
HGB URINE DIPSTICK: NEGATIVE
Ketones, ur: 15 mg/dL — AB
Leukocytes, UA: NEGATIVE
Nitrite: NEGATIVE
PH: 6 (ref 5.0–8.0)
Protein, ur: NEGATIVE mg/dL
SPECIFIC GRAVITY, URINE: 1.009 (ref 1.005–1.030)

## 2015-12-19 LAB — LACTIC ACID, PLASMA
Lactic Acid, Venous: 1.4 mmol/L (ref 0.5–1.9)
Lactic Acid, Venous: 0.9 mmol/L (ref 0.5–1.9)

## 2015-12-19 MED ORDER — ASPIRIN EC 81 MG PO TBEC: 891.0000 mg | DELAYED_RELEASE_TABLET | Freq: Every day | ORAL | Status: DC

## 2015-12-19 MED ORDER — ASPIRIN 300 MG RE SUPP
900.0000 mg | Freq: Every day | RECTAL | Status: DC | PRN
  Administered 2015-12-19: 900 mg via RECTAL
  Filled 2015-12-19 (×3): qty 1

## 2015-12-19 MED ORDER — ASPIRIN EC 81 MG PO TBEC: 81.0000 mg | DELAYED_RELEASE_TABLET | Freq: Every day | ORAL | Status: DC | PRN

## 2015-12-19 MED ORDER — LORAZEPAM 2 MG/ML IJ SOLN
1.0000 mg | Freq: Two times a day (BID) | INTRAMUSCULAR | Status: DC | PRN
  Administered 2015-12-19: 1 mg via INTRAVENOUS
  Filled 2015-12-19: qty 1

## 2015-12-19 MED ORDER — DEXTROSE 5 % IV SOLN
1.0000 g | INTRAVENOUS | Status: DC
  Administered 2015-12-19 – 2015-12-21 (×3): 1 g via INTRAVENOUS
  Filled 2015-12-19 (×4): qty 10

## 2015-12-19 MED ORDER — SODIUM CHLORIDE 0.9 % IV BOLUS (SEPSIS)
1000.0000 mL | Freq: Once | INTRAVENOUS | Status: AC
  Administered 2015-12-19: 1000 mL via INTRAVENOUS

## 2015-12-19 NOTE — Progress Notes (Signed)
Subjective: Patient still with cramping abdominal pain and nausea Passing minimal flatus  Objective: Vital signs in last 24 hours: Temp:  [99.3 F (37.4 C)-99.6 F (37.6 C)] 99.3 F (37.4 C) (08/13 0504) Pulse Rate:  [112-120] 120 (08/13 0504) Resp:  [16-18] 16 (08/13 0504) BP: (106-137)/(65-86) 106/65 (08/13 0504) SpO2:  [95 %-100 %] 98 % (08/13 0504) Last BM Date: 12/13/15  Intake/Output from previous day: 08/12 0701 - 08/13 0700 In: 1200 [I.V.:1200] Out: 1125 [Urine:600; Emesis/NG output:525] Intake/Output this shift: No intake/output data recorded.  Exam: Tachycardic Abdomen soft, non distended, minimally tender  Lab Results:   Recent Labs  12/18/15 1057 12/19/15 0428  WBC 15.0* 16.8*  HGB 13.3 12.5  HCT 37.7 38.0  PLT 338 356   BMET  Recent Labs  12/18/15 1057 12/19/15 0428  NA 134* 136  K 4.5 3.6  CL 103 101  CO2 20* 21*  GLUCOSE 97 118*  BUN <5* <5*  CREATININE 0.47 0.59  CALCIUM 8.9 8.8*   PT/INR  Recent Labs  12/17/15 0427  LABPROT 13.6  INR 1.04   ABG No results for input(s): PHART, HCO3 in the last 72 hours.  Invalid input(s): PCO2, PO2  Studies/Results: Dg Abd Portable 1v  Result Date: 12/19/2015 CLINICAL DATA:  Abdominal pain, nausea and vomiting. Small bowel obstruction. EXAM: PORTABLE ABDOMEN - 1 VIEW COMPARISON:  12/18/2015 FINDINGS: There is no residual small bowel dilation. Contrast is seen throughout a normal caliber colon and in the rectum. IMPRESSION: No evidence of a bowel obstruction.  No acute findings. Electronically Signed   By: Amie Portland M.D.   On: 12/19/2015 07:53   Dg Abd Portable 1v-small Bowel Obstruction Protocol-initial, 8 Hr Delay  Result Date: 12/18/2015 CLINICAL DATA:  8 hour radiograph, small-bowel obstruction protocol. Assess progression of contrast. Initial encounter. EXAM: PORTABLE ABDOMEN - 1 VIEW COMPARISON:  Abdominal radiograph performed 12/17/2015 FINDINGS: Contrast has progressed to the  level of the ascending and transverse colon. Residual mildly distended small bowel loops are still seen. Residual contrast is also seen in the stomach. No free intra-abdominal air is identified, though evaluation for free air is limited on a single supine view. An intrauterine device is noted overlying the mid pelvis. The visualized osseous structures are within normal limits; the sacroiliac joints are unremarkable in appearance. IMPRESSION: 1. Contrast progresses to the colon, without evidence for significant obstruction. 2. Residual mildly distended small bowel loops may reflect some degree of small bowel dysmotility. Residual contrast is also noted in the stomach, concerning for some degree of gastroparesis. Electronically Signed   By: Roanna Raider M.D.   On: 12/18/2015 05:23   Dg Abd Portable 1v  Result Date: 12/17/2015 CLINICAL DATA:  NG tube placement. EXAM: PORTABLE ABDOMEN - 1 VIEW COMPARISON:  12/16/2015 FINDINGS: The NG tube tip is in the body region of the stomach. Unremarkable bowel gas pattern. IMPRESSION: NG tube tip in the body region of stomach. Electronically Signed   By: Rudie Meyer M.D.   On: 12/17/2015 14:28    Anti-infectives: Anti-infectives    Start     Dose/Rate Route Frequency Ordered Stop   12/17/15 1400  fluconazole (DIFLUCAN) IVPB 150 mg     150 mg 75 mL/hr over 60 Minutes Intravenous  Once 12/17/15 1348 12/17/15 1622      Assessment/Plan:  sbo vs ileus, UTI  Xray today normal with no dilated small bowel and normal colon with contrast with no indication of an obstruction so will d/c the NG as  this may be causing her nausea WBC up Will bolus IVF Repeat labs in the morning  LOS: 2 days    Kadyn Chovan A 12/19/2015

## 2015-12-19 NOTE — Progress Notes (Signed)
NG tube removed by patient.

## 2015-12-19 NOTE — Progress Notes (Signed)
[facility] PROGRESS NOTE  Loretta Alexander ZOX:[id number] DOB: [birth year] DOA: 12/16/2015 PCP: No PCP Per Patient  Brief summary 35 year old female past medical history of multiple dental surgeries. Patient states that she has bowel obstruction regularly. She states that she just recently had several inches of bowel removed at an outside hospital. Records have been requested but not received. General surgery was consulted and advised small bowel follow-through protocol imaging. This was negative. Showed ileus versus gastroparesis. General surgery fills its possibly partial small bowel obstruction. Of note patient did have a recent urinary tract infection at the end of July. She then developed a yeast infection and was treating this at home prior to coming to the emergency room. Treatment was not complete patient was given a dose of Diflucan IV here. Due to the large amount discharge she had. A speculum exam was done that showed a large amount of purulent discharge. Patient currently feels this is decreased. Currently percent. Patient is having worsening tachycardia and increasing leukocytosis. No fevers. Impaired antibiotics started. Cultures drawn. Patient's NG tube has been removed.  Assessment/Plan:  1.  Abd pain / nausea/ vomiting w/ SIRS DDX partial small bowel obstruction versus ileus versus gastroparesis with ileus - Hx of repeated LOA and prior abd surgeries.   - NGT w/ minimal output -  NPO, IVF, getting MRI abd - requesting outside hosp records-->pending - Surgery team following recommendations appreciated -Frequency of pain medications decrease as it may be contributing to the issue Pt's abd exam is unimpressive again today -Started Rocephin, blood cultures, UA cath sent -Chest x-ray negative  2.  Hx prior LOA's / abd surgeries- had 8 in colon resected in [facility] last year, says the CT "misses the bowel obstruction" lately, which does not make sense for diagnosis of SBO.  Review of records in EPIC shows multiple complaints of N/V with neg abd CT.  3. Depression - no SI/HI - holding cymbalta  4. Hx of drug seeking behvaior - limit escalation of pain meds until definitive pathology found  5. Migraines -Patient has history of migraines and states that Phenergan combined with a lot of it is very effective. Has told patient numerous times that this combination is not able to be given. Discussed with pharmacy and due to patient's numerous allergies she'll benefit from Ativan plus aspirin rectal. This was effective.  6. Vaginal discharge -Patient was diagnosed UTI in late July and bowel antibiotics completed course Patient then developed a vaginal yeast infection and be given herself vaginal cream prior to admission - Speculum exam revealed a significant amount of mucopurulent discharge from the cervical os. No blood. No lesions. No cervical motion tenderness or uterine tenderness. GC and chlamydia sent and is pending. Wet prep negative. Patient did get 150 mg IV of Diflucan to finish up her treatment for her vaginal candidiasis  Consultants:  General surgery  Procedures:  none  Antibiotics:  Diflucan 8/12  Rocephin 8/13-->  HPI/Subjective: Patient had no vomiting overnight. NG tube out. Patient states abdominal pain and vaginal discharge less.  Objective: Vitals:   12/19/15 0954 12/19/15 1700  BP: 105/66 119/70  Pulse: (!) 125 (!) 111  Resp: 16 16  Temp: 99 F (37.2 C) 99 F (37.2 C)    Intake/Output Summary (Last 24 hours) at 12/19/15 1840 Last data filed at 12/19/15 1800  Gross per 24 hour  Intake             1500 ml  Output  900 ml  Net              600 ml   Filed Weights   12/16/15 1349 12/16/15 2241  Weight: 102.1 kg (225 lb) 104.1 kg (229 lb 8 oz)    Exam:   General:  No diaphoresis, anxious, no acute distress, NG tube in place  Cardiovascular: Regular rate and rhythm no murmurs rubs or gallops  Respiratory:  Clear to auscultation bilaterally no more breathing  Abdomen: Nondistended bowel sounds normal nontender palpation  Musculoskeletal: Moving all extremities, no deformity, 5 out of 5 strength   Data Reviewed: Basic Metabolic Panel:  Recent Labs Lab 12/16/15 1550 12/17/15 0427 12/18/15 1057 12/19/15 0428  NA 138 140 134* 136  K 3.6 3.9 4.5 3.6  CL 107 107 103 101  CO2 21* 23 20* 21*  GLUCOSE 131* 119* 97 118*  BUN 11 7 <5* <5*  CREATININE 0.59 0.57 0.47 0.59  CALCIUM 9.3 8.8* 8.9 8.8*   Liver Function Tests:  Recent Labs Lab 12/16/15 1550 12/19/15 0428  AST 23 25  ALT 37 51  ALKPHOS 58 63  BILITOT 0.8 1.7*  PROT 7.6 6.8  ALBUMIN 4.6 3.7    Recent Labs Lab 12/16/15 1550  LIPASE 35   No results for input(s): AMMONIA in the last 168 hours. CBC:  Recent Labs Lab 12/16/15 1550 12/17/15 0427 12/18/15 1057 12/19/15 0428  WBC 10.0 9.4 15.0* 16.8*  NEUTROABS 6.7  --  11.5* 13.7*  HGB 13.5 12.7 13.3 12.5  HCT 38.9 38.5 37.7 38.0  MCV 89.0 92.3 90.0 92.2  PLT 412* 386 338 356   Cardiac Enzymes: No results for input(s): CKTOTAL, CKMB, CKMBINDEX, TROPONINI in the last 168 hours. BNP (last 3 results) No results for input(s): BNP in the last 8760 hours.  ProBNP (last 3 results) No results for input(s): PROBNP in the last 8760 hours.  CBG: No results for input(s): GLUCAP in the last 168 hours.  Recent Results (from the past 240 hour(s))  Urine culture     Status: Abnormal   Collection Time: 12/16/15  2:33 PM  Result Value Ref Range Status   Specimen Description URINE, CLEAN CATCH  Final   Special Requests NONE  Final   Culture MULTIPLE SPECIES PRESENT, SUGGEST RECOLLECTION (A)  Final   Report Status 12/17/2015 FINAL  Final  Wet prep, genital     Status: Abnormal   Collection Time: 12/17/15  4:40 PM  Result Value Ref Range Status   Yeast Wet Prep HPF POC NONE SEEN NONE SEEN Final    Comment: SPECIMEN SUBMITTED IN OF SALINE, RESULTS MAY BE AFFWCTED.    Trich, Wet Prep NONE SEEN NONE SEEN Final   Clue Cells Wet Prep HPF POC NONE SEEN NONE SEEN Final   WBC, Wet Prep HPF POC FEW (A) NONE SEEN Final   Sperm NONE SEEN  Final     Studies: Dg Chest 2 View  Result Date: 12/19/2015 CLINICAL DATA:  35 year old female with shortness of breath wheezing and chest pain today. Initial encounter. EXAM: CHEST  2 VIEW COMPARISON:  Chest radiographs 04/13/2014 and earlier. FINDINGS: Cervical ACDF hardware is new since 2015. Right PICC line is in place, tip at the cavoatrial junction level. Chronically low lung volumes. Normal cardiac size and mediastinal contours. No pneumothorax or pulmonary edema. No pleural effusion or confluent pulmonary opacity. No pneumoperitoneum. Gas and oral contrast in nondilated large bowel in the upper abdomen. Stable cholecystectomy clips. No acute osseous abnormality identified.  IMPRESSION: No acute cardiopulmonary abnormality. Electronically Signed   By: Odessa FlemingH  Hall M.D.   On: 12/19/2015 17:54   Dg Abd Portable 1v  Result Date: 12/19/2015 CLINICAL DATA:  Abdominal pain, nausea and vomiting. Small bowel obstruction. EXAM: PORTABLE ABDOMEN - 1 VIEW COMPARISON:  12/18/2015 FINDINGS: There is no residual small bowel dilation. Contrast is seen throughout a normal caliber colon and in the rectum. IMPRESSION: No evidence of a bowel obstruction.  No acute findings. Electronically Signed   By: Amie Portlandavid  Ormond M.D.   On: 12/19/2015 07:53   Dg Abd Portable 1v-small Bowel Obstruction Protocol-initial, 8 Hr Delay  Result Date: 12/18/2015 CLINICAL DATA:  8 hour radiograph, small-bowel obstruction protocol. Assess progression of contrast. Initial encounter. EXAM: PORTABLE ABDOMEN - 1 VIEW COMPARISON:  Abdominal radiograph performed 12/17/2015 FINDINGS: Contrast has progressed to the level of the ascending and transverse colon. Residual mildly distended small bowel loops are still seen. Residual contrast is also seen in the stomach. No free  intra-abdominal air is identified, though evaluation for free air is limited on a single supine view. An intrauterine device is noted overlying the mid pelvis. The visualized osseous structures are within normal limits; the sacroiliac joints are unremarkable in appearance. IMPRESSION: 1. Contrast progresses to the colon, without evidence for significant obstruction. 2. Residual mildly distended small bowel loops may reflect some degree of small bowel dysmotility. Residual contrast is also noted in the stomach, concerning for some degree of gastroparesis. Electronically Signed   By: Roanna RaiderJeffery  Chang M.D.   On: 12/18/2015 05:23    Scheduled Meds: . cefTRIAXone (ROCEPHIN)  IV  1 g Intravenous Q24H  . heparin  5,000 Units Subcutaneous Q8H   Continuous Infusions: . 0.9 % NaCl with KCl 20 mEq / L 125 mL/hr at 12/19/15 [id number]    Principal Problem:   Bowel obstruction (HCC) Active Problems:   Abdominal pain   Nausea with vomiting   Depression   Migraine headache   Vaginal discharge    Time spent: 135    Haydee SalterPhillip M Aristidis Talerico  [facility] Pager Amion. If 7PM-7AM, please contact night-coverage at [email or web address], password [email or web address] 12/19/2015, 6:40 PM  LOS: 2 days

## 2015-12-20 LAB — CBC
HEMATOCRIT: 35.4 % — AB (ref 36.0–46.0)
Hemoglobin: 11.9 g/dL — ABNORMAL LOW (ref 12.0–15.0)
MCH: 30.6 pg (ref 26.0–34.0)
MCHC: 33.6 g/dL (ref 30.0–36.0)
MCV: 91 fL (ref 78.0–100.0)
PLATELETS: 348 10*3/uL (ref 150–400)
RBC: 3.89 MIL/uL (ref 3.87–5.11)
RDW: 13 % (ref 11.5–15.5)
WBC: 9.9 10*3/uL (ref 4.0–10.5)

## 2015-12-20 MED ORDER — HYDROMORPHONE HCL 2 MG PO TABS
2.0000 mg | ORAL_TABLET | ORAL | Status: DC | PRN
Start: 2015-12-20 — End: 2015-12-22
  Administered 2015-12-21 (×2): 2 mg via ORAL
  Filled 2015-12-20 (×2): qty 1

## 2015-12-20 MED ORDER — PANTOPRAZOLE SODIUM 40 MG PO TBEC
40.0000 mg | DELAYED_RELEASE_TABLET | Freq: Every day | ORAL | Status: DC
  Administered 2015-12-20 – 2015-12-22 (×3): 40 mg via ORAL
  Filled 2015-12-20 (×3): qty 1

## 2015-12-20 MED ORDER — DIPHENHYDRAMINE HCL 25 MG PO CAPS
25.0000 mg | ORAL_CAPSULE | ORAL | Status: DC | PRN
Start: 2015-12-20 — End: 2015-12-22

## 2015-12-20 NOTE — Consult Note (Signed)
   [facility]   12/20/2015  [name] 03/16/1981 [id number]    Came to bedside to visit on behalf of [facility] for [employer] employees/dependents with [facility]. Discussed Link to Wellness program. She currently denies having any Link to Wellness needs for HTN, DM, HLD, or asthma management. [facility] to [employer] packet and contact information left at bedside. Made her aware that she will receive post hospital discharge call at [phone]. Appreciative of visit. Made inpatient RNCM aware.  Raiford NobleAtika Hall, MSN-Ed, RN,BSN [facility] Hospital Liaison [phone]

## 2015-12-20 NOTE — Consult Note (Signed)
   [facility]   12/20/2015  Loretta MinorsStacy K Garner 09/22/1980 [id number]    Addendum:   During bedside conversation about Link to Wellness program. Discussed benefits of [facility] as well.    Raiford NobleAtika Niquita Digioia, MSN-Ed, RN,BSN [facility] Hospital Liaison [phone]

## 2015-12-20 NOTE — Progress Notes (Signed)
[facility] PROGRESS NOTE  Loretta MinorsStacy K Alexander ZOX:[id number] DOB: 02/17/1981 DOA: 12/16/2015 PCP: No PCP Per Patient  Brief summary 35 year old female past medical history of multiple dental surgeries. Patient states that she has bowel obstruction regularly. She states that she just recently had several inches of bowel removed at an outside hospital. Records have been requested but not received. General surgery was consulted and advised small bowel follow-through protocol imaging. This was negative. Showed ileus versus gastroparesis. General surgery fills its possibly partial small bowel obstruction. Of note patient did have a recent urinary tract infection at the end of July. She then developed a yeast infection and was treating this at home prior to coming to the emergency room. Treatment was not complete patient was given a dose of Diflucan IV here. Due to the large amount discharge she had. A speculum exam was done that showed a large amount of purulent discharge. Patient currently feels this is decreased. Currently percent. Patient is having worsening tachycardia and increasing leukocytosis. No fevers. Impaired antibiotics started. Cultures drawn. Patient's NG tube has been removed 8/13.  Assessment/Plan:  1.  Abd pain / nausea/ vomiting w/ SIRS DDX partial small bowel obstruction versus ileus versus gastroparesis with ileus - Hx of repeated LOA and prior abd surgeries.   - NGT w/ minimal output, was removed 8/13 -  Clear liq and adv as tol today per surgery - requesting outside hosp records-->pending - Surgery team following recommendations appreciated -Frequency of pain medications decrease as it may be contributing to the issue Pt's abd exam is unimpressive again today -Started Rocephin, blood cultures, UA cath sent -Chest x-ray negative  2.  Hx prior LOA's / abd surgeries- [place] [facility], says the CT "misses the bowel obstruction" lately, which does  not make sense for diagnosis of SBO. Review of records in EPIC shows multiple complaints of N/V with neg abd CT.  3. Depression - no SI/HI - holding cymbalta  4. Hx of drug seeking behvaior - limit escalation of pain meds until definitive pathology found  5. Migraines -Patient has history of migraines and states that Phenergan combined with a lot of it is very effective. Has told patient numerous times that this combination is not able to be given. Discussed with pharmacy and due to patient's numerous allergies she'll benefit from Ativan plus aspirin rectal. This was effective.  6. Vaginal discharge -Patient was diagnosed UTI in late July and bowel antibiotics completed course Patient then developed a vaginal yeast infection and be given herself vaginal cream prior to admission - Speculum exam revealed a significant amount of mucopurulent discharge from the cervical os. No blood. No lesions. No cervical motion tenderness or uterine tenderness. GC and chlamydia sent but will not be done as lab notes was not collected properly. Wet prep negative. Patient did get 150 mg IV of Diflucan to finish up her treatment for her vaginal candidiasis. Discussed with her that while she has some discharge there is no urgent indication for further treatment and if she is otherwise ready for d/c she can seek outpt consult with Gyn. She is in agreement with this plan.  Consultants:  General surgery  Procedures:  none Antibiotics:  Diflucan 8/12  Rocephin 8/13-->  HPI/Subjective: Patient had no vomiting overnight. NG tube out. Patient states abdominal pain and vaginal discharge less,pain is better and she would like a diet this AM. Surgery has ordered clear liquid diet today.  Objective: Vitals:   12/20/15  0509 12/20/15 0900  BP: 111/79 118/70  Pulse: (!) 101 96  Resp: 16 16  Temp: 99.1 F (37.3 C) 98.5 F (36.9 C)    Intake/Output Summary (Last 24 hours) at 12/20/15 1256 Last data filed at  12/20/15 0900  Gross per 24 hour  Intake             1000 ml  Output             1050 ml  Net              -50 ml   Filed Weights   12/16/15 1349 12/16/15 2241  Weight: 102.1 kg (225 lb) 104.1 kg (229 lb 8 oz)    Exam:   General:  No diaphoresis, not anxious, no acute distress, resting comfortably, mother is at the bedside  Cardiovascular: Regular rate and rhythm no murmurs rubs or gallops  Respiratory: Clear to auscultation bilaterally no more breathing  Abdomen: Nondistended bowel sounds normal nontender palpation  Musculoskeletal: Moving all extremities, no deformity, 5 out of 5 strength   Data Reviewed: Basic Metabolic Panel:  Recent Labs Lab 12/16/15 1550 12/17/15 0427 12/18/15 1057 12/19/15 0428  NA 138 140 134* 136  K 3.6 3.9 4.5 3.6  CL 107 107 103 101  CO2 21* 23 20* 21*  GLUCOSE 131* 119* 97 118*  BUN 11 7 <5* <5*  CREATININE 0.59 0.57 0.47 0.59  CALCIUM 9.3 8.8* 8.9 8.8*   Liver Function Tests:  Recent Labs Lab 12/16/15 1550 12/19/15 0428  AST 23 25  ALT 37 51  ALKPHOS 58 63  BILITOT 0.8 1.7*  PROT 7.6 6.8  ALBUMIN 4.6 3.7    Recent Labs Lab 12/16/15 1550  LIPASE 35   No results for input(s): AMMONIA in the last 168 hours. CBC:  Recent Labs Lab 12/16/15 1550 12/17/15 0427 12/18/15 1057 12/19/15 0428 12/20/15 0438  WBC 10.0 9.4 15.0* 16.8* 9.9  NEUTROABS 6.7  --  11.5* 13.7*  --   HGB 13.5 12.7 13.3 12.5 11.9*  HCT 38.9 38.5 37.7 38.0 35.4*  MCV 89.0 92.3 90.0 92.2 91.0  PLT 412* 386 338 356 348   Cardiac Enzymes: No results for input(s): CKTOTAL, CKMB, CKMBINDEX, TROPONINI in the last 168 hours. BNP (last 3 results) No results for input(s): BNP in the last 8760 hours.  ProBNP (last 3 results) No results for input(s): PROBNP in the last 8760 hours.  CBG: No results for input(s): GLUCAP in the last 168 hours.  Recent Results (from the past 240 hour(s))  Urine culture     Status: Abnormal   Collection Time: 12/16/15   2:33 PM  Result Value Ref Range Status   Specimen Description URINE, CLEAN CATCH  Final   Special Requests NONE  Final   Culture MULTIPLE SPECIES PRESENT, SUGGEST RECOLLECTION (A)  Final   Report Status 12/17/2015 FINAL  Final  Wet prep, genital     Status: Abnormal   Collection Time: 12/17/15  4:40 PM  Result Value Ref Range Status   Yeast Wet Prep HPF POC NONE SEEN NONE SEEN Final    Comment: SPECIMEN SUBMITTED IN 2ML OF SALINE, RESULTS MAY BE AFFWCTED.   Trich, Wet Prep NONE SEEN NONE SEEN Final   Clue Cells Wet Prep HPF POC NONE SEEN NONE SEEN Final   WBC, Wet Prep HPF POC FEW (A) NONE SEEN Final   Sperm NONE SEEN  Final     Studies: Dg Chest 2 View  Result Date: 12/19/2015  CLINICAL DATA:  35 year old female with shortness of breath wheezing and chest pain today. Initial encounter. EXAM: CHEST  2 VIEW COMPARISON:  Chest radiographs 04/13/2014 and earlier. FINDINGS: Cervical ACDF hardware is new since 2015. Right PICC line is in place, tip at the cavoatrial junction level. Chronically low lung volumes. Normal cardiac size and mediastinal contours. No pneumothorax or pulmonary edema. No pleural effusion or confluent pulmonary opacity. No pneumoperitoneum. Gas and oral contrast in nondilated large bowel in the upper abdomen. Stable cholecystectomy clips. No acute osseous abnormality identified. IMPRESSION: No acute cardiopulmonary abnormality. Electronically Signed   By: Odessa Fleming M.D.   On: 12/19/2015 17:54   Dg Abd Portable 1v  Result Date: 12/19/2015 CLINICAL DATA:  Abdominal pain, nausea and vomiting. Small bowel obstruction. EXAM: PORTABLE ABDOMEN - 1 VIEW COMPARISON:  12/18/2015 FINDINGS: There is no residual small bowel dilation. Contrast is seen throughout a normal caliber colon and in the rectum. IMPRESSION: No evidence of a bowel obstruction.  No acute findings. Electronically Signed   By: Amie Portland M.D.   On: 12/19/2015 07:53    Scheduled Meds: . cefTRIAXone (ROCEPHIN)  IV   1 g Intravenous Q24H  . heparin  5,000 Units Subcutaneous Q8H  . pantoprazole  40 mg Oral Daily   Continuous Infusions: . 0.9 % NaCl with KCl 20 mEq / L 125 mL/hr at 12/19/15 2055    Principal Problem:   Bowel obstruction (HCC) Active Problems:   Abdominal pain   Nausea with vomiting   Depression   Migraine headache   Vaginal discharge  Time spent: 22 minutes  Mir Vergie Living  [facility] Pager Amion. If 7PM-7AM, please contact night-coverage at [email or web address], password [facility] 12/20/2015, 12:56 PM  LOS: 3 days

## 2015-12-20 NOTE — Progress Notes (Signed)
[facility] Progress Note     Subjective: Pt wants to eat Pain better  Overall feels better   Objective: Vital signs in last 24 hours: Temp:  [98.7 F (37.1 C)-99.1 F (37.3 C)] 99.1 F (37.3 C) (08/14 0509) Pulse Rate:  [101-125] 101 (08/14 0509) Resp:  [16] 16 (08/14 0509) BP: (105-128)/(66-82) 111/79 (08/14 0509) SpO2:  [99 %-100 %] 99 % (08/14 0509) Last BM Date: 12/13/15  Intake/Output from previous day: 08/13 0701 - 08/14 0700 In: 1500 [I.V.:1500] Out: 1650 [Urine:1650] Intake/Output this shift: No intake/output data recorded.  PE: Abdomen soft less distended  Lab Results:   Recent Labs  12/19/15 0428 12/20/15 0438  WBC 16.8* 9.9  HGB 12.5 11.9*  HCT 38.0 35.4*  PLT 356 348   BMET  Recent Labs  12/18/15 1057 12/19/15 0428  NA 134* 136  K 4.5 3.6  CL 103 101  CO2 20* 21*  GLUCOSE 97 118*  BUN <5* <5*  CREATININE 0.47 0.59  CALCIUM 8.9 8.8*   PT/INR No results for input(s): LABPROT, INR in the last 72 hours. CMP     Component Value Date/Time   NA 136 12/19/2015 0428   NA 139 01/03/2014 1847   K 3.6 12/19/2015 0428   K 4.1 01/03/2014 1847   CL 101 12/19/2015 0428   CL 108 (H) 01/03/2014 1847   CO2 21 (L) 12/19/2015 0428   CO2 23 01/03/2014 1847   GLUCOSE 118 (H) 12/19/2015 0428   GLUCOSE 174 (H) 01/03/2014 1847   BUN <5 (L) 12/19/2015 0428   BUN 9 01/03/2014 1847   CREATININE 0.59 12/19/2015 0428   CREATININE 0.66 01/03/2014 1847   CALCIUM 8.8 (L) 12/19/2015 0428   CALCIUM 8.8 01/03/2014 1847   PROT 6.8 12/19/2015 0428   PROT 7.2 01/03/2014 1847   ALBUMIN 3.7 12/19/2015 0428   ALBUMIN 3.3 (L) 01/03/2014 1847   AST 25 12/19/2015 0428   AST 30 01/03/2014 1847   ALT 51 12/19/2015 0428   ALT 30 01/03/2014 1847   ALKPHOS 63 12/19/2015 0428   ALKPHOS 82 01/03/2014 1847   BILITOT 1.7 (H) 12/19/2015 0428   BILITOT 0.3 01/03/2014 1847   GFRNONAA >60 12/19/2015 0428   GFRNONAA >60 01/03/2014 1847   GFRAA >60 12/19/2015  0428   GFRAA >60 01/03/2014 1847   Lipase     Component Value Date/Time   LIPASE 35 12/16/2015 1550   Studies/Results: Dg Chest 2 View  Result Date: 12/19/2015 CLINICAL DATA:  35 year old female with shortness of breath wheezing and chest pain today. Initial encounter. EXAM: CHEST  2 VIEW COMPARISON:  Chest radiographs 04/13/2014 and earlier. FINDINGS: Cervical ACDF hardware is new since 2015. Right PICC line is in place, tip at the cavoatrial junction level. Chronically low lung volumes. Normal cardiac size and mediastinal contours. No pneumothorax or pulmonary edema. No pleural effusion or confluent pulmonary opacity. No pneumoperitoneum. Gas and oral contrast in nondilated large bowel in the upper abdomen. Stable cholecystectomy clips. No acute osseous abnormality identified. IMPRESSION: No acute cardiopulmonary abnormality. Electronically Signed   By: Odessa FlemingH  Hall M.D.   On: 12/19/2015 17:54   Dg Abd Portable 1v  Result Date: 12/19/2015 CLINICAL DATA:  Abdominal pain, nausea and vomiting. Small bowel obstruction. EXAM: PORTABLE ABDOMEN - 1 VIEW COMPARISON:  12/18/2015 FINDINGS: There is no residual small bowel dilation. Contrast is seen throughout a normal caliber colon and in the rectum. IMPRESSION: No evidence of a bowel obstruction.  No acute findings. Electronically Signed  By: Amie Portlandavid  Ormond M.D.   On: 12/19/2015 07:53    Anti-infectives: Anti-infectives    Start     Dose/Rate Route Frequency Ordered Stop   12/19/15 1845  cefTRIAXone (ROCEPHIN) 1 g in dextrose 5 % 50 mL IVPB     1 g 100 mL/hr over 30 Minutes Intravenous Every 24 hours 12/19/15 1839     12/17/15 1400  fluconazole (DIFLUCAN) IVPB 150 mg     150 mg 75 mL/hr over 60 Minutes Intravenous  Once 12/17/15 1348 12/17/15 1622     Assessment/Plan ileus v pSBO- resolving - PMH multiple abdominal surgeries; s/p partial colectomy in [place] last year - NGT d/c-ed 12/19/15  UTI - Rocephin; blood and urine cx  pending Leukocytosis -improved  Tachycardia - stable  Vaginal discharge - GC/chlamydia pending Yeast infection - one dose IV diflucan Depression Migraines - ativan + rectal ASA  JOA:[id number]:advance diet Contrast in the colon and no significant distention    LOS: 3 days    Adam PhenixElizabeth S Simaan , [facility] 12/20/2015, 7:35 AM Pager: [phone] Consults: [phone] Mon-Fri 7:00 am-4:30 pm Sat-Sun 7:00 am-11:30 am

## 2015-12-21 MED ORDER — DIPHENHYDRAMINE HCL 50 MG/ML IJ SOLN
25.0000 mg | Freq: Two times a day (BID) | INTRAMUSCULAR | Status: DC | PRN
  Administered 2015-12-21: 25 mg via INTRAVENOUS
  Filled 2015-12-21: qty 1

## 2015-12-21 MED ORDER — HYDROMORPHONE HCL 1 MG/ML IJ SOLN: 1.0000 mg | Freq: Two times a day (BID) | INTRAMUSCULAR | Status: DC | PRN

## 2015-12-21 NOTE — Progress Notes (Signed)
[facility] PROGRESS NOTE  Loretta Alexander ZHY:[id number] DOB: 10/31/1980 DOA: 12/16/2015 PCP: No PCP Per Patient  Brief summary 35 year old female past medical history of multiple dental surgeries. Patient states that she has bowel obstruction regularly. She states that she just recently had several inches of bowel removed at an outside hospital. Records have been requested but not received. General surgery was consulted and advised small bowel follow-through protocol imaging. This was negative. Showed ileus versus gastroparesis. General surgery felt it was possibly partial small bowel obstruction. Of note patient did have a recent urinary tract infection at the end of July. She then developed a yeast infection and was treating this at home prior to coming to the emergency room. Treatment was not complete patient was given a dose of Diflucan IV here. Due to the large amount discharge she had. A speculum exam was done that showed a large amount of purulent discharge. Patient currently feels this is decreased. Patient's NG tube has been removed 8/13.  Assessment/Plan:  1.  Abd pain / nausea/ vomiting w/ SIRS DDX partial small bowel obstruction versus ileus versus gastroparesis with ileus - Hx of repeated LOA and prior abd surgeries.   - NGT w/ minimal output, was removed 8/13 -  Clear liq and adv as tol today - requesting outside hosp records-->pending - Surgery team following recommendations appreciated, they have now signed off -Frequency of pain medications decrease as it may be contributing to the issue, discussed changing IV meds to Q12 with plan to wean off completely and discharge home in AM. She agrees with this plan. Pt's abd exam is unimpressive again today -Started Rocephin, blood cultures, UA cath sent -Chest x-ray negative  2.  Hx prior LOA's / abd surgeries- [place] [facility] [place], says the CT "misses the bowel obstruction" lately, which does not make  sense for diagnosis of SBO. Review of records in [facility] shows multiple complaints of N/V with neg abd CT.  3. Depression - no SI/HI - holding cymbalta  4. Hx of drug seeking behvaior - limit escalation of pain meds until definitive pathology found  5. Migraines -Patient has history of migraines and states that Phenergan combined with a lot of it is very effective. Has told patient numerous times that this combination is not able to be given. Discussed with pharmacy and due to patient's numerous allergies she'll benefit from Ativan plus aspirin rectal. This was effective.  6. Vaginal discharge -Patient was diagnosed UTI in late July and bowel antibiotics completed course Patient then developed a vaginal yeast infection and be given herself vaginal cream prior to admission - Speculum exam revealed a significant amount of mucopurulent discharge from the cervical os. No blood. No lesions. No cervical motion tenderness or uterine tenderness. GC and chlamydia sent but will not be done as lab notes was not collected properly. Wet prep negative. Patient did get 150 mg IV of Diflucan to finish up her treatment for her vaginal candidiasis. Discussed with her that while she has some discharge there is no urgent indication for further treatment and if she is otherwise ready for d/c she can seek outpt consult with Gyn. She is in agreement with this plan.  Consultants:  General surgery  Procedures:  none Antibiotics:  Diflucan 8/12  Rocephin 8/13-->  HPI/Subjective: Patient had one episode of vomiting yesterday AM. Patient states abdominal pain and vaginal discharge less,pain is better and she would like to try and advance diet today. We discussed  weaning IV meds and she agrees with plan to wean and hopefully discharge home tomorrow.  Objective: Vitals:   12/21/15 0434 12/21/15 0916  BP: 111/73 116/77  Pulse: 88 83  Resp: 17 17  Temp: 98.4 F (36.9 C) 98.3 F (36.8 C)    Intake/Output  Summary (Last 24 hours) at 12/21/15 1213 Last data filed at 12/21/15 0930  Gross per 24 hour  Intake              462 ml  Output              600 ml  Net             -138 ml   Filed Weights   12/16/15 2241 12/20/15 2057 12/21/15 0437  Weight: 104.1 kg (229 lb 8 oz) 105.5 kg (232 lb 9.4 oz) 105.6 kg (232 lb 12.9 oz)    Exam:   General:  No diaphoresis, not anxious, no acute distress, resting comfortably, mother is at the bedside  Cardiovascular: Regular rate and rhythm no murmurs rubs or gallops  Respiratory: Clear to auscultation bilaterally no more breathing  Abdomen: Nondistended bowel sounds normal nontender palpation  Musculoskeletal: Moving all extremities, no deformity, 5 out of 5 strength   Data Reviewed: Basic Metabolic Panel:  Recent Labs Lab 12/16/15 1550 12/17/15 0427 12/18/15 1057 12/19/15 0428  NA 138 140 134* 136  K 3.6 3.9 4.5 3.6  CL 107 107 103 101  CO2 21* 23 20* 21*  GLUCOSE 131* 119* 97 118*  BUN 11 7 <5* <5*  CREATININE 0.59 0.57 0.47 0.59  CALCIUM 9.3 8.8* 8.9 8.8*   Liver Function Tests:  Recent Labs Lab 12/16/15 1550 12/19/15 0428  AST 23 25  ALT 37 51  ALKPHOS 58 63  BILITOT 0.8 1.7*  PROT 7.6 6.8  ALBUMIN 4.6 3.7    Recent Labs Lab 12/16/15 1550  LIPASE 35   No results for input(s): AMMONIA in the last 168 hours. CBC:  Recent Labs Lab 12/16/15 1550 12/17/15 0427 12/18/15 1057 12/19/15 0428 12/20/15 0438  WBC 10.0 9.4 15.0* 16.8* 9.9  NEUTROABS 6.7  --  11.5* 13.7*  --   HGB 13.5 12.7 13.3 12.5 11.9*  HCT 38.9 38.5 37.7 38.0 35.4*  MCV 89.0 92.3 90.0 92.2 91.0  PLT 412* 386 338 356 348   Cardiac Enzymes: No results for input(s): CKTOTAL, CKMB, CKMBINDEX, TROPONINI in the last 168 hours. BNP (last 3 results) No results for input(s): BNP in the last 8760 hours.  ProBNP (last 3 results) No results for input(s): PROBNP in the last 8760 hours.  CBG: No results for input(s): GLUCAP in the last 168  hours.  Recent Results (from the past 240 hour(s))  Urine culture     Status: Abnormal   Collection Time: 12/16/15  2:33 PM  Result Value Ref Range Status   Specimen Description URINE, CLEAN CATCH  Final   Special Requests NONE  Final   Culture MULTIPLE SPECIES PRESENT, SUGGEST RECOLLECTION (A)  Final   Report Status 12/17/2015 FINAL  Final  Wet prep, genital     Status: Abnormal   Collection Time: 12/17/15  4:40 PM  Result Value Ref Range Status   Yeast Wet Prep HPF POC NONE SEEN NONE SEEN Final    Comment: SPECIMEN SUBMITTED IN 2ML OF SALINE, RESULTS MAY BE AFFWCTED.   Trich, Wet Prep NONE SEEN NONE SEEN Final   Clue Cells Wet Prep HPF POC NONE SEEN NONE SEEN Final  WBC, Wet Prep HPF POC FEW (A) NONE SEEN Final   Sperm NONE SEEN  Final  Culture, blood (Routine X 2) w Reflex to ID Panel     Status: None (Preliminary result)   Collection Time: 12/19/15  7:05 PM  Result Value Ref Range Status   Specimen Description BLOOD LEFT ARM  Final   Special Requests IN PEDIATRIC BOTTLE 1CC  Final   Culture NO GROWTH < 24 HOURS  Final   Report Status PENDING  Incomplete  Culture, blood (Routine X 2) w Reflex to ID Panel     Status: None (Preliminary result)   Collection Time: 12/19/15  7:15 PM  Result Value Ref Range Status   Specimen Description BLOOD LEFT HAND  Final   Special Requests IN PEDIATRIC BOTTLE 1CC  Final   Culture NO GROWTH < 24 HOURS  Final   Report Status PENDING  Incomplete     Studies: Dg Chest 2 View  Result Date: 12/19/2015 CLINICAL DATA:  35 year old female with shortness of breath wheezing and chest pain today. Initial encounter. EXAM: CHEST  2 VIEW COMPARISON:  Chest radiographs 04/13/2014 and earlier. FINDINGS: Cervical ACDF hardware is new since 2015. Right PICC line is in place, tip at the cavoatrial junction level. Chronically low lung volumes. Normal cardiac size and mediastinal contours. No pneumothorax or pulmonary edema. No pleural effusion or confluent  pulmonary opacity. No pneumoperitoneum. Gas and oral contrast in nondilated large bowel in the upper abdomen. Stable cholecystectomy clips. No acute osseous abnormality identified. IMPRESSION: No acute cardiopulmonary abnormality. Electronically Signed   By: Odessa Fleming M.D.   On: 12/19/2015 17:54    Scheduled Meds: . cefTRIAXone (ROCEPHIN)  IV  1 g Intravenous Q24H  . heparin  5,000 Units Subcutaneous Q8H  . pantoprazole  40 mg Oral Daily   Continuous Infusions: . 0.9 % NaCl with KCl 20 mEq / L 125 mL/hr at 12/21/15 0556    Principal Problem:   Bowel obstruction (HCC) Active Problems:   Abdominal pain   Nausea with vomiting   Depression   Migraine headache   Vaginal discharge  Time spent: 24 minutes  Dondre Catalfamo Vergie Living  [facility] Pager Amion. If 7PM-7AM, please contact night-coverage at [email or web address], password [facility] 12/21/2015, 12:13 PM  LOS: 4 days

## 2015-12-21 NOTE — Progress Notes (Signed)
Patient ID: [name], female   DOB: 07/22/1980, 35 y.o.   MRN: [id number]  [facility] Progress [facility]     Subjective: Feeling better today than yesterday. No n/v in 24 hours. Tolerated clears. Reports daily loose stools, no flatus.  Objective: Vital signs in last 24 hours: Temp:  [98.3 F (36.8 C)-98.4 F (36.9 C)] 98.3 F (36.8 C) (08/15 0916) Pulse Rate:  [83-105] 83 (08/15 0916) Resp:  [17] 17 (08/15 0916) BP: (101-116)/(64-77) 116/77 (08/15 0916) SpO2:  [98 %-100 %] 98 % (08/15 0916) Weight:  [232 lb 9.4 oz (105.5 kg)-232 lb 12.9 oz (105.6 kg)] 232 lb 12.9 oz (105.6 kg) (08/15 0437) Last BM Date: 12/20/15  Intake/Output from previous day: 08/14 0701 - 08/15 0700 In: 462 [P.O.:462] Out: 600 [Urine:400; Stool:200] Intake/Output this shift: No intake/output data recorded.  PE: Gen:  Alert, NAD, cooperative Card:  RRR Pulm:  CTAB Abd: Soft, ND, minimally tender, +BS  Lab Results:   Recent Labs  12/19/15 0428 12/20/15 0438  WBC 16.8* 9.9  HGB 12.5 11.9*  HCT 38.0 35.4*  PLT 356 348   BMET  Recent Labs  12/18/15 1057 12/19/15 0428  NA 134* 136  K 4.5 3.6  CL 103 101  CO2 20* 21*  GLUCOSE 97 118*  BUN <5* <5*  CREATININE 0.47 0.59  CALCIUM 8.9 8.8*   PT/INR No results for input(s): LABPROT, INR in the last 72 hours. CMP     Component Value Date/Time   NA 136 12/19/2015 0428   NA 139 01/03/2014 1847   K 3.6 12/19/2015 0428   K 4.1 01/03/2014 1847   CL 101 12/19/2015 0428   CL 108 (H) 01/03/2014 1847   CO2 21 (L) 12/19/2015 0428   CO2 23 01/03/2014 1847   GLUCOSE 118 (H) 12/19/2015 0428   GLUCOSE 174 (H) 01/03/2014 1847   BUN <5 (L) 12/19/2015 0428   BUN 9 01/03/2014 1847   CREATININE 0.59 12/19/2015 0428   CREATININE 0.66 01/03/2014 1847   CALCIUM 8.8 (L) 12/19/2015 0428   CALCIUM 8.8 01/03/2014 1847   PROT 6.8 12/19/2015 0428   PROT 7.2 01/03/2014 1847   ALBUMIN 3.7 12/19/2015 0428   ALBUMIN 3.3 (L) 01/03/2014 1847   AST 25 12/19/2015 0428   AST 30 01/03/2014 1847   ALT 51 12/19/2015 0428   ALT 30 01/03/2014 1847   ALKPHOS 63 12/19/2015 0428   ALKPHOS 82 01/03/2014 1847   BILITOT 1.7 (H) 12/19/2015 0428   BILITOT 0.3 01/03/2014 1847   GFRNONAA >60 12/19/2015 0428   GFRNONAA >60 01/03/2014 1847   GFRAA >60 12/19/2015 0428   GFRAA >60 01/03/2014 1847   Lipase     Component Value Date/Time   LIPASE 35 12/16/2015 1550       Studies/Results: Dg Chest 2 View  Result Date: 12/19/2015 CLINICAL DATA:  35 year old female with shortness of breath wheezing and chest pain today. Initial encounter. EXAM: CHEST  2 VIEW COMPARISON:  Chest radiographs 04/13/2014 and earlier. FINDINGS: Cervical ACDF hardware is new since 2015. Right PICC line is in place, tip at the cavoatrial junction level. Chronically low lung volumes. Normal cardiac size and mediastinal contours. No pneumothorax or pulmonary edema. No pleural effusion or confluent pulmonary opacity. No pneumoperitoneum. Gas and oral contrast in nondilated large bowel in the upper abdomen. Stable cholecystectomy clips. No acute osseous abnormality identified. IMPRESSION: No acute cardiopulmonary abnormality. Electronically Signed   By: Odessa FlemingH  Hall M.D.   On: 12/19/2015 17:54  Anti-infectives: Anti-infectives    Start     Dose/Rate Route Frequency Ordered Stop   12/19/15 1845  cefTRIAXone (ROCEPHIN) 1 g in dextrose 5 % 50 mL IVPB     1 g 100 mL/hr over 30 Minutes Intravenous Every 24 hours 12/19/15 1839     12/17/15 1400  fluconazole (DIFLUCAN) IVPB 150 mg     150 mg 75 mL/hr over 60 Minutes Intravenous  Once 12/17/15 1348 12/17/15 1622       Assessment/Plan Ileus vs pSBO vs gastroparesis - resolving. Tolerated clears. Contrast in the colon and no significant distention. UTI - Rocephin; blood and urine cx pending, no growth as of yet Tachycardia - stable  Vaginal discharge - GC/chlamydia pending. Can seek outpatient consult with Gyn Yeast infection -  one dose IV diflucan, discharge has decreased per patient Depression Migraines - ativan + rectal ASA VTE- heparin FEN- advance diet.  Dispo - general surgery to sign off. Call if she does not continue to improve.    LOS: 4 days    Edson SnowballBROOKE A MILLER , [facility] 12/21/2015, 10:08 AM Pager: [phone] Consults: [phone] Mon-Fri 7:00 am-4:30 pm Sat-Sun 7:00 am-11:30 am

## 2015-12-22 DIAGNOSIS — N898 Other specified noninflammatory disorders of vagina: Secondary | ICD-10-CM

## 2015-12-22 DIAGNOSIS — K565 Intestinal adhesions [bands] with obstruction (postprocedural) (postinfection): Secondary | ICD-10-CM

## 2015-12-22 DIAGNOSIS — F329 Major depressive disorder, single episode, unspecified: Secondary | ICD-10-CM

## 2015-12-22 MED ORDER — AZITHROMYCIN 1 G PO PACK
1.0000 g | PACK | Freq: Once | ORAL | Status: AC
  Administered 2015-12-22: 1 g via ORAL
  Filled 2015-12-22: qty 1

## 2015-12-22 MED ORDER — OXYCODONE-ACETAMINOPHEN 5-325 MG PO TABS
1.0000 | ORAL_TABLET | ORAL | Status: DC | PRN
  Filled 2015-12-22: qty 1

## 2015-12-22 MED ORDER — METRONIDAZOLE 500 MG PO TABS
2000.0000 mg | ORAL_TABLET | Freq: Once | ORAL | Status: AC
Start: 2015-12-22 — End: 2015-12-22
  Administered 2015-12-22: 2000 mg via ORAL
  Filled 2015-12-22: qty 4

## 2015-12-22 NOTE — Progress Notes (Signed)
Discharge instructions and medications discussed with patient.  All questions answered.  

## 2015-12-22 NOTE — Discharge Summary (Signed)
PATIENT DETAILS Name: Loretta Alexander Age: 35 y.o. Sex: female Date of Birth: 05/06/1981 MRN: [id number]. Admitting Physician: Delano Metzobert Schertz, MD PCP:No PCP Per Patient  Admit Date: 12/16/2015 Discharge date: 12/22/2015  Recommendations for Outpatient Follow-up:  1. Follow up with PCP in 1-2 weeks 2. Please obtain BMP/CBC in one week  Admitted From:  Home  Disposition: Home   Home Health: No  Equipment/Devices: None  Discharge Condition: Stable  CODE STATUS: FULL CODE  Diet recommendation:  Heart Healthy / Carb Modified   Brief Summary: See H&P, Labs, Consult and Test reports for all details in brief, patient is a 35 year old female who was admitted for abdominal pain along with nausea and vomiting. Further evaluation revealed possible bowel obstruction.  Brief Hospital Course: Possible small bowel obstruction: Admitted and managed with supportive care and general surgery was consulted. Diet has slowly been advanced, by day of discharge tolerating diet no longer having abdominal pain. Passing flatus and having bowel movements. She stable for discharge, no further recommendations from general surgery and they have signed off.  Possible vaginitis: Patient complaining of vaginal discharge and pruritus, previous M.D. (Dr. Kirby CriglerIkramullah) performed a speculum exam, revealed a significant amount of mucopurulent discharge from the cervical os. Apparently cultures and GC studies could not be done as the sample was collected improperly (see prior notes). She has been on intravenous ceftriaxone, hence will give one dose of 2 g of Flagyl and 1500 mg of Zithromax to complete empiric treatment. She did complete a course of Diflucan per prior progress note. I have asked her to follow-up with the PCP if her problems persist, and to seek a outpatient consultation with gynecology.  History of depression: Mood seems stable this morning, she is being resumed on her usual medications on  discharge.  Drug-seeking behavior-currently denies any abdominal pain-I have not given her any prescriptions for narcotics on discharge. She is to continue her usual medications-that she was taking prior to this admission.  Procedures/Studies: None  Discharge Diagnoses:  Principal Problem:   Bowel obstruction (HCC) Active Problems:   Abdominal pain   Nausea with vomiting   Depression   Migraine headache   Vaginal discharge   Discharge Instructions:  Activity:  As tolerated  Discharge Instructions    [facility]    Complete by:  As directed   Please assign UMR member for post toc call. [facility]. Please call with questions. Thanks. Raiford NobleAtika Hall, MSN-Ed, RN,BSN- [facility] Hospital Liaison-[phone]   Reason for consult:  Please assign UMR member for post toc.   Expected date of contact:  1-3 days (reserved for hospital discharges)   Call MD for:  persistant nausea and vomiting    Complete by:  As directed   Diet - low sodium heart healthy    Complete by:  As directed   Diet Carb Modified    Complete by:  As directed   Increase activity slowly    Complete by:  As directed       Medication List    TAKE these medications   DULoxetine 60 MG capsule Commonly known as:  CYMBALTA Take 60 mg by mouth daily.   levonorgestrel 20 MCG/24HR IUD Commonly known as:  MIRENA 1 each by Intrauterine route once. 2010   metFORMIN 500 MG 24 hr tablet Commonly known as:  GLUCOPHAGE-XR Take 500 mg by mouth daily with breakfast.   oxyCODONE-acetaminophen 5-325 MG tablet Commonly known as:  PERCOCET/ROXICET Take 1  tablet by mouth every 4 (four) hours as needed for severe pain.   QUEtiapine 50 MG tablet Commonly known as:  SEROQUEL Take 150 mg by mouth at bedtime.   tiZANidine 4 MG tablet Commonly known as:  ZANAFLEX Take 4 mg by mouth every 6 (six) hours as needed for muscle spasms.      Follow-up Information    Dr Levada Dy.  Schedule an appointment as soon as possible for a visit in 1 day(s).          Allergies  Allergen Reactions  . Prochlorperazine Maleate Other (See Comments)    Patient can tolerate phenergan Major anxiety attack  . Compazine [Prochlorperazine Maleate] Anxiety and Other (See Comments)    dizziness  . Haloperidol And Related Anxiety and Other (See Comments)    dizziness  . Reglan [Metoclopramide Hcl] Anxiety and Other (See Comments)    dizziness  . Toradol [Ketorolac Tromethamine] Nausea And Vomiting      Consultations:  Gen Surgery   Other Procedures/Studies: Dg Chest 2 View  Result Date: 12/19/2015 CLINICAL DATA:  35 year old female with shortness of breath wheezing and chest pain today. Initial encounter. EXAM: CHEST  2 VIEW COMPARISON:  Chest radiographs 04/13/2014 and earlier. FINDINGS: Cervical ACDF hardware is new since 2015. Right PICC line is in place, tip at the cavoatrial junction level. Chronically low lung volumes. Normal cardiac size and mediastinal contours. No pneumothorax or pulmonary edema. No pleural effusion or confluent pulmonary opacity. No pneumoperitoneum. Gas and oral contrast in nondilated large bowel in the upper abdomen. Stable cholecystectomy clips. No acute osseous abnormality identified. IMPRESSION: No acute cardiopulmonary abnormality. Electronically Signed   By: Odessa Fleming M.D.   On: 12/19/2015 17:54   Ct Abdomen Pelvis W Contrast  Result Date: 11/27/2015 CLINICAL DATA:  Periumbilical pain with nausea and vomiting. Diarrhea with blood in stool. EXAM: CT ABDOMEN AND PELVIS WITH CONTRAST TECHNIQUE: Multidetector CT imaging of the abdomen and pelvis was performed using the standard protocol following bolus administration of intravenous contrast. CONTRAST:  ISOVUE-300 IOPAMIDOL (ISOVUE-300) INJECTION 61% COMPARISON:  04/13/2014 FINDINGS: Lower chest and abdominal wall: Abdominal wall scarring without acute finding. Hepatobiliary: No focal liver  abnormality.Cholecystectomy. No common bile duct dilatation. Pancreas: Unremarkable. Spleen: Unremarkable. Adrenals/Urinary Tract: Negative adrenals. No hydronephrosis or stone. Unremarkable bladder. Stomach/Bowel: Small bowel anastomosis in the right abdomen. Appendectomy. No bowel obstruction or inflammatory wall thickening. Reproductive:IUD in good position. Left-sided corpus luteum. Surgically absent right ovary. Vascular/Lymphatic: No acute vascular abnormality. No mass or adenopathy. Other: Small pelvic fluid which could be physiologic. Musculoskeletal: Negative. IMPRESSION: 1. No acute finding. 2. Left corpus luteum and small volume pelvic fluid. Electronically Signed   By: Marnee Spring M.D.   On: 11/27/2015 02:45   Dg Abd 2 Views  Addendum Date: 12/16/2015   ADDENDUM REPORT: 12/16/2015 18:10 ADDENDUM: Mildly dilated gas fluid level containing small bowel loops are seen in the right lower quadrant of the abdomen, which likely correspond to the distal ileum anastomotic bowel loops, which in correlation to previous CT demonstrate relatively stable appearance, considering cross imaging comparison. Alternatively, early small bowel obstruction may have a similar appearance. Electronically Signed   By: Ted Mcalpine M.D.   On: 12/16/2015 18:10   Result Date: 12/16/2015 CLINICAL DATA:  Lower abdominal pain and vomiting. History of recent small bowel obstruction. EXAM: ABDOMEN - 2 VIEW COMPARISON:  None. FINDINGS: The bowel gas pattern is normal. There is no evidence of free air. No radio-opaque calculi or other significant radiographic  abnormality is seen. IMPRESSION: Negative. Electronically Signed: By: Ted Mcalpine M.D. On: 12/16/2015 17:47   Dg Abd Portable 1v  Result Date: 12/19/2015 CLINICAL DATA:  Abdominal pain, nausea and vomiting. Small bowel obstruction. EXAM: PORTABLE ABDOMEN - 1 VIEW COMPARISON:  12/18/2015 FINDINGS: There is no residual small bowel dilation. Contrast is seen  throughout a normal caliber colon and in the rectum. IMPRESSION: No evidence of a bowel obstruction.  No acute findings. Electronically Signed   By: Amie Portland M.D.   On: 12/19/2015 07:53   Dg Abd Portable 1v-small Bowel Obstruction Protocol-initial, 8 Hr Delay  Result Date: 12/18/2015 CLINICAL DATA:  8 hour radiograph, small-bowel obstruction protocol. Assess progression of contrast. Initial encounter. EXAM: PORTABLE ABDOMEN - 1 VIEW COMPARISON:  Abdominal radiograph performed 12/17/2015 FINDINGS: Contrast has progressed to the level of the ascending and transverse colon. Residual mildly distended small bowel loops are still seen. Residual contrast is also seen in the stomach. No free intra-abdominal air is identified, though evaluation for free air is limited on a single supine view. An intrauterine device is noted overlying the mid pelvis. The visualized osseous structures are within normal limits; the sacroiliac joints are unremarkable in appearance. IMPRESSION: 1. Contrast progresses to the colon, without evidence for significant obstruction. 2. Residual mildly distended small bowel loops may reflect some degree of small bowel dysmotility. Residual contrast is also noted in the stomach, concerning for some degree of gastroparesis. Electronically Signed   By: Roanna Raider M.D.   On: 12/18/2015 05:23   Dg Abd Portable 1v  Result Date: 12/17/2015 CLINICAL DATA:  NG tube placement. EXAM: PORTABLE ABDOMEN - 1 VIEW COMPARISON:  12/16/2015 FINDINGS: The NG tube tip is in the body region of the stomach. Unremarkable bowel gas pattern. IMPRESSION: NG tube tip in the body region of stomach. Electronically Signed   By: Rudie Meyer M.D.   On: 12/17/2015 14:28      TODAY-DAY OF DISCHARGE:  Subjective:   Loretta Alexander today has no headache,no chest abdominal pain,no new weakness tingling or numbness, feels much better wants to go home today.   Objective:   Blood pressure 121/63, pulse (!) 117,  temperature 98.5 F (36.9 C), temperature source Oral, resp. rate 18, height 5\' 2"  (1.575 m), weight 105.7 kg (233 lb 0.4 oz), SpO2 98 %.  Intake/Output Summary (Last 24 hours) at 12/22/15 1029 Last data filed at 12/22/15 0900  Gross per 24 hour  Intake          9346.67 ml  Output             1785 ml  Net          7561.67 ml   Filed Weights   12/20/15 2057 12/21/15 0437 12/21/15 2023  Weight: 105.5 kg (232 lb 9.4 oz) 105.6 kg (232 lb 12.9 oz) 105.7 kg (233 lb 0.4 oz)    Exam: Awake Alert, Oriented *3, No new F.N deficits, Normal affect [place].AT,PERRAL Supple Neck,No JVD, No cervical lymphadenopathy appriciated.  Symmetrical Chest wall movement, Good air movement bilaterally, CTAB RRR,No Gallops,Rubs or new Murmurs, No Parasternal Heave +ve B.Sounds, Abd Soft, Non tender, No organomegaly appriciated, No rebound -guarding or rigidity. No Cyanosis, Clubbing or edema, No new Rash or bruise   PERTINENT RADIOLOGIC STUDIES: Dg Chest 2 View  Result Date: 12/19/2015 CLINICAL DATA:  35 year old female with shortness of breath wheezing and chest pain today. Initial encounter. EXAM: CHEST  2 VIEW COMPARISON:  Chest radiographs 04/13/2014 and earlier. FINDINGS: Cervical  ACDF hardware is new since 2015. Right PICC line is in place, tip at the cavoatrial junction level. Chronically low lung volumes. Normal cardiac size and mediastinal contours. No pneumothorax or pulmonary edema. No pleural effusion or confluent pulmonary opacity. No pneumoperitoneum. Gas and oral contrast in nondilated large bowel in the upper abdomen. Stable cholecystectomy clips. No acute osseous abnormality identified. IMPRESSION: No acute cardiopulmonary abnormality. Electronically Signed   By: Odessa FlemingH  Hall M.D.   On: 12/19/2015 17:54   Ct Abdomen Pelvis W Contrast  Result Date: 11/27/2015 CLINICAL DATA:  Periumbilical pain with nausea and vomiting. Diarrhea with blood in stool. EXAM: CT ABDOMEN AND PELVIS WITH CONTRAST TECHNIQUE:  Multidetector CT imaging of the abdomen and pelvis was performed using the standard protocol following bolus administration of intravenous contrast. CONTRAST:  100mL ISOVUE-300 IOPAMIDOL (ISOVUE-300) INJECTION 61% COMPARISON:  04/13/2014 FINDINGS: Lower chest and abdominal wall: Abdominal wall scarring without acute finding. Hepatobiliary: No focal liver abnormality.Cholecystectomy. No common bile duct dilatation. Pancreas: Unremarkable. Spleen: Unremarkable. Adrenals/Urinary Tract: Negative adrenals. No hydronephrosis or stone. Unremarkable bladder. Stomach/Bowel: Small bowel anastomosis in the right abdomen. Appendectomy. No bowel obstruction or inflammatory wall thickening. Reproductive:IUD in good position. Left-sided corpus luteum. Surgically absent right ovary. Vascular/Lymphatic: No acute vascular abnormality. No mass or adenopathy. Other: Small pelvic fluid which could be physiologic. Musculoskeletal: Negative. IMPRESSION: 1. No acute finding. 2. Left corpus luteum and small volume pelvic fluid. Electronically Signed   By: Marnee SpringJonathon  Watts M.D.   On: 11/27/2015 02:45   Dg Abd 2 Views  Addendum Date: 12/16/2015   ADDENDUM REPORT: 12/16/2015 18:10 ADDENDUM: Mildly dilated gas fluid level containing small bowel loops are seen in the right lower quadrant of the abdomen, which likely correspond to the distal ileum anastomotic bowel loops, which in correlation to previous CT demonstrate relatively stable appearance, considering cross imaging comparison. Alternatively, early small bowel obstruction may have a similar appearance. Electronically Signed   By: Ted Mcalpineobrinka  Dimitrova M.D.   On: 12/16/2015 18:10   Result Date: 12/16/2015 CLINICAL DATA:  Lower abdominal pain and vomiting. History of recent small bowel obstruction. EXAM: ABDOMEN - 2 VIEW COMPARISON:  None. FINDINGS: The bowel gas pattern is normal. There is no evidence of free air. No radio-opaque calculi or other significant radiographic abnormality is  seen. IMPRESSION: Negative. Electronically Signed: By: Ted Mcalpineobrinka  Dimitrova M.D. On: 12/16/2015 17:47   Dg Abd Portable 1v  Result Date: 12/19/2015 CLINICAL DATA:  Abdominal pain, nausea and vomiting. Small bowel obstruction. EXAM: PORTABLE ABDOMEN - 1 VIEW COMPARISON:  12/18/2015 FINDINGS: There is no residual small bowel dilation. Contrast is seen throughout a normal caliber colon and in the rectum. IMPRESSION: No evidence of a bowel obstruction.  No acute findings. Electronically Signed   By: Amie Portlandavid  Ormond M.D.   On: 12/19/2015 07:53   Dg Abd Portable 1v-small Bowel Obstruction Protocol-initial, 8 Hr Delay  Result Date: 12/18/2015 CLINICAL DATA:  8 hour radiograph, small-bowel obstruction protocol. Assess progression of contrast. Initial encounter. EXAM: PORTABLE ABDOMEN - 1 VIEW COMPARISON:  Abdominal radiograph performed 12/17/2015 FINDINGS: Contrast has progressed to the level of the ascending and transverse colon. Residual mildly distended small bowel loops are still seen. Residual contrast is also seen in the stomach. No free intra-abdominal air is identified, though evaluation for free air is limited on a single supine view. An intrauterine device is noted overlying the mid pelvis. The visualized osseous structures are within normal limits; the sacroiliac joints are unremarkable in appearance. IMPRESSION: 1. Contrast progresses to the  colon, without evidence for significant obstruction. 2. Residual mildly distended small bowel loops may reflect some degree of small bowel dysmotility. Residual contrast is also noted in the stomach, concerning for some degree of gastroparesis. Electronically Signed   By: Roanna Raider M.D.   On: 12/18/2015 05:23   Dg Abd Portable 1v  Result Date: 12/17/2015 CLINICAL DATA:  NG tube placement. EXAM: PORTABLE ABDOMEN - 1 VIEW COMPARISON:  12/16/2015 FINDINGS: The NG tube tip is in the body region of the stomach. Unremarkable bowel gas pattern. IMPRESSION: NG tube tip  in the body region of stomach. Electronically Signed   By: Rudie Meyer M.D.   On: 12/17/2015 14:28     PERTINENT LAB RESULTS: CBC:  Recent Labs  12/20/15 0438  WBC 9.9  HGB 11.9*  HCT 35.4*  PLT 348   CMET CMP     Component Value Date/Time   NA 136 12/19/2015 0428   NA 139 01/03/2014 1847   K 3.6 12/19/2015 0428   K 4.1 01/03/2014 1847   CL 101 12/19/2015 0428   CL 108 (H) 01/03/2014 1847   CO2 21 (L) 12/19/2015 0428   CO2 23 01/03/2014 1847   GLUCOSE 118 (H) 12/19/2015 0428   GLUCOSE 174 (H) 01/03/2014 1847   BUN <5 (L) 12/19/2015 0428   BUN 9 01/03/2014 1847   CREATININE 0.59 12/19/2015 0428   CREATININE 0.66 01/03/2014 1847   CALCIUM 8.8 (L) 12/19/2015 0428   CALCIUM 8.8 01/03/2014 1847   PROT 6.8 12/19/2015 0428   PROT 7.2 01/03/2014 1847   ALBUMIN 3.7 12/19/2015 0428   ALBUMIN 3.3 (L) 01/03/2014 1847   AST 25 12/19/2015 0428   AST 30 01/03/2014 1847   ALT 51 12/19/2015 0428   ALT 30 01/03/2014 1847   ALKPHOS 63 12/19/2015 0428   ALKPHOS 82 01/03/2014 1847   BILITOT 1.7 (H) 12/19/2015 0428   BILITOT 0.3 01/03/2014 1847   GFRNONAA >60 12/19/2015 0428   GFRNONAA >60 01/03/2014 1847   GFRAA >60 12/19/2015 0428   GFRAA >60 01/03/2014 1847    GFR Estimated Creatinine Clearance: 113.1 mL/min (by C-G formula based on SCr of 0.8 mg/dL). No results for input(s): LIPASE, AMYLASE in the last 72 hours. No results for input(s): CKTOTAL, CKMB, CKMBINDEX, TROPONINI in the last 72 hours. Invalid input(s): POCBNP No results for input(s): DDIMER in the last 72 hours. No results for input(s): HGBA1C in the last 72 hours. No results for input(s): CHOL, HDL, LDLCALC, TRIG, CHOLHDL, LDLDIRECT in the last 72 hours. No results for input(s): TSH, T4TOTAL, T3FREE, THYROIDAB in the last 72 hours.  Invalid input(s): FREET3 No results for input(s): VITAMINB12, FOLATE, FERRITIN, TIBC, IRON, RETICCTPCT in the last 72 hours. Coags: No results for input(s): INR in the last 72  hours.  Invalid input(s): PT Microbiology: Recent Results (from the past 240 hour(s))  Urine culture     Status: Abnormal   Collection Time: 12/16/15  2:33 PM  Result Value Ref Range Status   Specimen Description URINE, CLEAN CATCH  Final   Special Requests NONE  Final   Culture MULTIPLE SPECIES PRESENT, SUGGEST RECOLLECTION (A)  Final   Report Status 12/17/2015 FINAL  Final  Wet prep, genital     Status: Abnormal   Collection Time: 12/17/15  4:40 PM  Result Value Ref Range Status   Yeast Wet Prep HPF POC NONE SEEN NONE SEEN Final    Comment: SPECIMEN SUBMITTED IN OF SALINE, RESULTS MAY BE AFFWCTED.   Ivery Quale,  Wet Prep NONE SEEN NONE SEEN Final   Clue Cells Wet Prep HPF POC NONE SEEN NONE SEEN Final   WBC, Wet Prep HPF POC FEW (A) NONE SEEN Final   Sperm NONE SEEN  Final  Culture, blood (Routine X 2) w Reflex to ID Panel     Status: None (Preliminary result)   Collection Time: 12/19/15  7:05 PM  Result Value Ref Range Status   Specimen Description BLOOD LEFT ARM  Final   Special Requests IN PEDIATRIC BOTTLE 1CC  Final   Culture NO GROWTH 2 DAYS  Final   Report Status PENDING  Incomplete  Culture, blood (Routine X 2) w Reflex to ID Panel     Status: None (Preliminary result)   Collection Time: 12/19/15  7:15 PM  Result Value Ref Range Status   Specimen Description BLOOD LEFT HAND  Final   Special Requests IN PEDIATRIC BOTTLE 1CC  Final   Culture NO GROWTH 2 DAYS  Final   Report Status PENDING  Incomplete    FURTHER DISCHARGE INSTRUCTIONS:  Get Medicines reviewed and adjusted: Please take all your medications with you for your next visit with your Primary MD  Laboratory/radiological data: Please request your Primary MD to go over all hospital tests and procedure/radiological results at the follow up, please ask your Primary MD to get all Hospital records sent to his/her office.  In some cases, they will be blood work, cultures and biopsy results pending at the time of  your discharge. Please request that your primary care M.D. goes through all the records of your hospital data and follows up on these results.  Also Note the following: If you experience worsening of your admission symptoms, develop shortness of breath, life threatening emergency, suicidal or homicidal thoughts you must seek medical attention immediately by calling 911 or calling your MD immediately  if symptoms less severe.  You must read complete instructions/literature along with all the possible adverse reactions/side effects for all the Medicines you take and that have been prescribed to you. Take any new Medicines after you have completely understood and accpet all the possible adverse reactions/side effects.   Do not drive when taking Pain medications or sleeping medications (Benzodaizepines)  Do not take more than prescribed Pain, Sleep and Anxiety Medications. It is not advisable to combine anxiety,sleep and pain medications without talking with your primary care practitioner  Special Instructions: If you have smoked or chewed Tobacco  in the last 2 yrs please stop smoking, stop any regular Alcohol  and or any Recreational drug use.  Wear Seat belts while driving.  Please note: You were cared for by a hospitalist during your hospital stay. Once you are discharged, your primary care physician will handle any further medical issues. Please note that NO REFILLS for any discharge medications will be authorized once you are discharged, as it is imperative that you return to your primary care physician (or establish a relationship with a primary care physician if you do not have one) for your post hospital discharge needs so that they can reassess your need for medications and monitor your lab values.  Total Time spent coordinating discharge including counseling, education and face to face time equals 25 minutes.  SignedJeoffrey Massed 12/22/2015 10:29 AM

## 2015-12-23 ENCOUNTER — Other Ambulatory Visit: Payer: Self-pay | Admitting: *Deleted

## 2015-12-23 NOTE — Patient Outreach (Signed)
[facility] Care Management  12/23/2015  Guerry MinorsStacy K Garner 01/17/1981 [id number]  Subjective: Telephone call to patient's home number, no answer, left HIPAA compliant voicemail message, and requested call back.   Objective: Per chart review: Patient hospitalized  12/16/15 - 12/22/15 for bowel obstruction.   Patient had ED visit on 11/26/15 for abdominal pain.  Patient also has a history of depression and migraine headaches.    Assessment: Received [facility] Transition of Care referral on 12/20/15.    Transition of care screening / follow up pending,  patient contact.     Plan: RNCM will call patient for 2nd telephone outreach attempt, transition of care screening/ follow up, within 10 business days, if no return call.   Sonam Huelsmann H. Gardiner Barefootooper RN, BSN, CCM [facility] Telephonic CM Phone: [phone] Fax: [phone]

## 2015-12-24 ENCOUNTER — Ambulatory Visit: Payer: Self-pay | Admitting: *Deleted

## 2015-12-24 LAB — CULTURE, BLOOD (ROUTINE X 2)
CULTURE: NO GROWTH
Culture: NO GROWTH

## 2015-12-28 ENCOUNTER — Other Ambulatory Visit: Payer: Self-pay | Admitting: *Deleted

## 2015-12-28 ENCOUNTER — Ambulatory Visit: Payer: Self-pay | Admitting: *Deleted

## 2015-12-28 ENCOUNTER — Encounter: Payer: Self-pay | Admitting: *Deleted

## 2015-12-28 NOTE — Patient Outreach (Addendum)
[facility]) Care Management  12/28/2015  Loretta MinorsStacy K Alexander 08/04/1980 [id number]   Subjective: Received voicemail message from patient, states she has been feeling under the weather, apologized for the delay in return call, and requested call back.  Telephone call to patient's home/ mobile number, no answer, left HIPAA compliant voicemail message, and requested call back. Telephone call from patient and HIPAA verified.   Discussed [facility] Care [facility] UMR Transition of care follow up and Link Wellness Prediabetes program.   Patient voices understanding and is in agreement to complete follow up. Patient states she is doing well and went back to work on 12/27/15.  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  Patient states she has an appointment with her new primary MD (Dr. [name]) on 01/17/16 and will have her hospital follow up at that time.  States she is aware of signs and symptoms to call for earlier follow up appointment if needed.   RNCM advised patient of [facility] network benefits for [employer] employees, [employer] employee outpatient pharmacy benefits, [facility] employee nutritional counseling benefit, and [facility] urgent care benefits for [employer] employees.   Patient voices understanding and states she is very appreciative of information.   States she will call [facility] outpatient pharmacy and request her prescriptions be transferred from local pharmacy.  Patient states she has a family history of diabetes and is proactively taking Metformin.   States she currently does not have a prediabetes diagnosis.   States if she receives a diagnosis of prediabetes then she will contact the [facility] [employer] Link Wellness program for enrollment.    Patient in agreement to receiving [facility] Care [facility] information and the following EMMI handouts : Counting Carbohydrates, Low-Salt Diet, and Managing Your Weight with Healthy Eating.   Patient states she does not have any transition of  care, care coordination, disease management, disease monitoring, transportation, community resource, or pharmacy needs at this time.     Objective: Per chart review: Patient hospitalized  12/16/15 - 12/22/15 for bowel obstruction.   Patient had ED visit on 11/26/15 for abdominal pain.  Patient also has a history of depression and migraine headaches.    Assessment: Received UMR Transition of Care referral on 12/20/15. Transition of care screening / follow up completed.   Patient does not have any care management needs or telephonic RNCM needs at this time.      Plan:  RNCM will send patient successful outreach letter, [facility] pamphlet, magnet, and the following EMMI handouts: Counting Carbohydrates, Low-Salt Diet, Managing Your Weight with Healthy Eating.   RNCM will send case closure due to follow up completed / no care management needs request to Iverson AlaminLaura Greeson at [facility] Care Management. RNCM will notify Iverson AlaminLaura Greeson at [facility] Care Management of patient's new primary MD, to be updated in patient's chart.       Carime Dinkel H. Gardiner Barefootooper RN, BSN, CCM [facility] Care Management [facility] Telephonic CM Phone: [phone] Fax: [phone]

## 2015-12-30 ENCOUNTER — Emergency Department (HOSPITAL_BASED_OUTPATIENT_CLINIC_OR_DEPARTMENT_OTHER)
Admission: EM | Admit: 2015-12-30 | Discharge: 2015-12-30 | Disposition: A | Discharge status: Home / Self Care | Payer: 59 | Attending: Emergency Medicine | Admitting: Emergency Medicine

## 2015-12-30 ENCOUNTER — Encounter (HOSPITAL_BASED_OUTPATIENT_CLINIC_OR_DEPARTMENT_OTHER): Payer: Self-pay | Admitting: *Deleted

## 2015-12-30 ENCOUNTER — Emergency Department (HOSPITAL_BASED_OUTPATIENT_CLINIC_OR_DEPARTMENT_OTHER): Payer: 59

## 2015-12-30 DIAGNOSIS — R630 Anorexia: Secondary | ICD-10-CM | POA: Diagnosis not present

## 2015-12-30 DIAGNOSIS — R1033 Periumbilical pain: Secondary | ICD-10-CM | POA: Insufficient documentation

## 2015-12-30 DIAGNOSIS — K5900 Constipation, unspecified: Secondary | ICD-10-CM | POA: Diagnosis not present

## 2015-12-30 DIAGNOSIS — R109 Unspecified abdominal pain: Secondary | ICD-10-CM

## 2015-12-30 DIAGNOSIS — R112 Nausea with vomiting, unspecified: Secondary | ICD-10-CM | POA: Diagnosis not present

## 2015-12-30 DIAGNOSIS — R634 Abnormal weight loss: Secondary | ICD-10-CM | POA: Insufficient documentation

## 2015-12-30 DIAGNOSIS — R1012 Left upper quadrant pain: Secondary | ICD-10-CM | POA: Diagnosis not present

## 2015-12-30 DIAGNOSIS — R197 Diarrhea, unspecified: Secondary | ICD-10-CM | POA: Insufficient documentation

## 2015-12-30 DIAGNOSIS — R1084 Generalized abdominal pain: Secondary | ICD-10-CM | POA: Diagnosis not present

## 2015-12-30 LAB — URINALYSIS, ROUTINE W REFLEX MICROSCOPIC
BILIRUBIN URINE: NEGATIVE
GLUCOSE, UA: NEGATIVE mg/dL
HGB URINE DIPSTICK: NEGATIVE
KETONES UR: NEGATIVE mg/dL
Leukocytes, UA: NEGATIVE
Nitrite: NEGATIVE
PH: 5.5 (ref 5.0–8.0)
Protein, ur: NEGATIVE mg/dL
SPECIFIC GRAVITY, URINE: 1.014 (ref 1.005–1.030)

## 2015-12-30 LAB — CBC WITH DIFFERENTIAL/PLATELET
BASOS ABS: 0.2 10*3/uL — AB (ref 0.0–0.1)
BASOS PCT: 2 %
EOS ABS: 0.9 10*3/uL — AB (ref 0.0–0.7)
Eosinophils Relative: 8 %
HEMATOCRIT: 40.3 % (ref 36.0–46.0)
Hemoglobin: 14 g/dL (ref 12.0–15.0)
LYMPHS PCT: 29 %
Lymphs Abs: 3.4 10*3/uL (ref 0.7–4.0)
MCH: 30.8 pg (ref 26.0–34.0)
MCHC: 34.7 g/dL (ref 30.0–36.0)
MCV: 88.8 fL (ref 78.0–100.0)
MONO ABS: 0.7 10*3/uL (ref 0.1–1.0)
Metamyelocytes Relative: 1 %
Monocytes Relative: 6 %
NEUTROS PCT: 54 %
Neutro Abs: 6.4 10*3/uL (ref 1.7–7.7)
PLATELETS: 435 10*3/uL — AB (ref 150–400)
RBC: 4.54 MIL/uL (ref 3.87–5.11)
RDW: 13 % (ref 11.5–15.5)
WBC: 11.6 10*3/uL — AB (ref 4.0–10.5)

## 2015-12-30 LAB — COMPREHENSIVE METABOLIC PANEL
ALBUMIN: 4.2 g/dL (ref 3.5–5.0)
ALK PHOS: 63 U/L (ref 38–126)
ALT: 44 U/L (ref 14–54)
AST: 19 U/L (ref 15–41)
Anion gap: 11 (ref 5–15)
BILIRUBIN TOTAL: 0.5 mg/dL (ref 0.3–1.2)
BUN: 10 mg/dL (ref 6–20)
CALCIUM: 9.5 mg/dL (ref 8.9–10.3)
CO2: 23 mmol/L (ref 22–32)
CREATININE: 0.53 mg/dL (ref 0.44–1.00)
Chloride: 103 mmol/L (ref 101–111)
GFR calc Af Amer: >60 mL/min (ref >60)
GLUCOSE: 144 mg/dL — AB (ref 65–99)
POTASSIUM: 3.6 mmol/L (ref 3.5–5.1)
SODIUM: 137 mmol/L (ref 135–145)
TOTAL PROTEIN: 7.7 g/dL (ref 6.5–8.1)

## 2015-12-30 LAB — LIPASE, BLOOD: LIPASE: 48 U/L (ref 11–51)

## 2015-12-30 LAB — I-STAT CG4 LACTIC ACID, ED: Lactic Acid, Venous: 1.52 mmol/L (ref 0.5–1.9)

## 2015-12-30 MED ORDER — FENTANYL CITRATE (PF) 100 MCG/2ML IJ SOLN
50.0000 ug | INTRAMUSCULAR | Status: DC | PRN
  Administered 2015-12-30 (×2): 50 ug via INTRAVENOUS
  Filled 2015-12-30 (×2): qty 2

## 2015-12-30 MED ORDER — ONDANSETRON HCL 4 MG/2ML IJ SOLN
4.0000 mg | INTRAMUSCULAR | Status: AC | PRN
  Administered 2015-12-30 (×2): 4 mg via INTRAVENOUS
  Filled 2015-12-30 (×2): qty 2

## 2015-12-30 MED ORDER — SODIUM CHLORIDE 0.9 % IV BOLUS (SEPSIS)
1000.0000 mL | Freq: Once | INTRAVENOUS | Status: AC
  Administered 2015-12-30: 1000 mL via INTRAVENOUS

## 2015-12-30 MED ORDER — ONDANSETRON 4 MG PO TBDP: 4.0000 mg | ORAL_TABLET | Freq: Three times a day (TID) | ORAL | 0 refills | Status: DC | PRN

## 2015-12-30 NOTE — ED Notes (Signed)
Sudden onset of abd pain x 1 hour ago with N/V. Pt had 4mg  zofran PTA. Pt with hx of recent bowel obstruction.

## 2015-12-30 NOTE — ED Notes (Signed)
Pt requesting phenergan and pain meds. MD notified, no orders given at this time.

## 2015-12-30 NOTE — ED Provider Notes (Signed)
[facility] Provider Note   CSN: [id number] Arrival date & time: 12/30/15  1159     History   Chief Complaint Chief Complaint  Patient presents with  . Abdominal Pain    HPI Loretta Alexander is a 35 y.o. female.  The history is provided by the patient.  Abdominal Pain   This is a recurrent problem. The current episode started 6 to 12 hours ago. The problem occurs constantly. The problem has not changed since onset.The pain is associated with a previous surgery. The pain is located in the periumbilical region and LUQ. Associated symptoms include anorexia, diarrhea (prior to onset of constipation), nausea, vomiting (starting today) and constipation (last BM 4 days ago). Pertinent negatives include fever. Associated symptoms comments: Lost 10 lbs in last 1.5 weeks. Exacerbated by: laying flat or standing. Nothing relieves the symptoms. Past workup includes CT scan. Past medical history comments: recurrent SBO x at least 5.    Past Medical History:  Diagnosis Date  . Complication of anesthesia    Difficult IV stick with Central line placement, TACHY 180-190 S/P COLON SURGERY (resolved with treatment for pain)   . Depression   . Drug-seeking behavior   . Dysrhythmia    INCREASED HR AT TIMES  . Eye infection    within last 2 weeks  . Gastritis 03/2011   Per EGD  . GERD (gastroesophageal reflux disease)    OCC TUMS  . Intestinal obstruction (HCC)   . Migraines   . Obesity   . Ovarian cyst, right    Hx of oophorectomy for adhesions  . Pneumonia    04/2010  . Renal disorder    Kidney Stones  . Thyroid nodule   . UTI (lower urinary tract infection)     Patient Active Problem List   Diagnosis Date Noted  . Vaginal discharge 12/18/2015  . Bowel obstruction (HCC) 12/17/2015  . Dysphagia 05/09/2015  . Neck swelling 05/08/2015  . S/P cervical spinal fusion 04/28/2015  . Migraine headache 01/20/2013  . Diplopia 01/20/2013  . Left-sided weakness 01/20/2013  .  Right sided abdominal pain 01/11/2012  . Hyperglycemia 01/11/2012  . Drug-seeking behavior 01/11/2012  . Obesity 01/11/2012  . Constipation 01/11/2012  . Depression 08/31/2011  . Gastritis 04/06/2011  . Abdominal pain 04/03/2011  . Foreign body 04/03/2011  . Nausea with vomiting 04/03/2011    Past Surgical History:  Procedure Laterality Date  . ABDOMINAL ADHESION SURGERY  2006   BIL FALLOPIAN TUBES REMOVED  . ANTERIOR CERVICAL DECOMP/DISCECTOMY FUSION N/A 04/28/2015   Procedure: Cervical five-six, Cervical six - seven Anterior cervical decompression/diskectomy/fusion;  Surgeon: Tia Alertavid S Jones, MD;  [facility]: [facility];  Service: Neurosurgery;  Laterality: N/A;  C5-6 C6-7 Anterior cervical decompression/diskectomy/fusion  . APPENDECTOMY  1992  . CHOLECYSTECTOMY  2003  . COLON SURGERY     MAY 2015     BOWEL OBST s/p partial bowel resection [facility]  . ESOPHAGOGASTRODUODENOSCOPY  04/05/2011   Procedure: ESOPHAGOGASTRODUODENOSCOPY (EGD);  Surgeon: Charna ElizabethJyothi Mann, MD;  Location: [facility] END[facility]COPY;  Service: Endoscopy;  Laterality: N/A;  . HERNIA REPAIR     umbilical  . KNEE ARTHROSCOPY  2001, 2010   left knee x 2  . LAPAROSCOPIC GASTRIC BANDING    . NASAL SEPTOPLASTY W/ TURBINOPLASTY  03/16/2011   Procedure: NASAL SEPTOPLASTY WITH TURBINATE REDUCTION;  Surgeon: Susy FrizzleJefry H Rosen, MD;  Location: [facility] OR;  Service: ENT;  Laterality: Bilateral;  . PILONIDAL CYST EXCISION    . removal of lap  band    . right ovary removed  2006  . TONSILLECTOMY  1991    OB History    No data available       Home Medications    Prior to Admission medications   Medication Sig Start Date End Date Taking? Authorizing Provider  DULoxetine (CYMBALTA) 60 MG capsule Take 60 mg by mouth daily.     Historical Provider, MD  levonorgestrel (MIRENA) 20 MCG/24HR IUD 1 each by Intrauterine route once. 2010    Historical Provider, MD  metFORMIN (GLUCOPHAGE-XR) 500 MG 24 hr tablet Take 500 mg by mouth daily with  breakfast.    Historical Provider, MD  oxyCODONE-acetaminophen (PERCOCET/ROXICET) 5-325 MG tablet Take 1 tablet by mouth every 4 (four) hours as needed for severe pain.    Historical Provider, MD  QUEtiapine (SEROQUEL) 50 MG tablet Take 150 mg by mouth at bedtime.    Historical Provider, MD  tiZANidine (ZANAFLEX) 4 MG tablet Take 4 mg by mouth every 6 (six) hours as needed for muscle spasms.    Historical Provider, MD    Family History No family history on file.  Social History Social History  Substance Use Topics  . Smoking status: Never Smoker  . Smokeless tobacco: Never Used  . Alcohol use Yes     Comment: rarely     Allergies   Prochlorperazine maleate; Compazine [prochlorperazine maleate]; Haloperidol and related; Reglan [metoclopramide hcl]; and Toradol [ketorolac tromethamine]   Review of Systems Review of Systems  Constitutional: Negative for fever.  Gastrointestinal: Positive for abdominal pain, anorexia, constipation (last BM 4 days ago), diarrhea (prior to onset of constipation), nausea and vomiting (starting today).  All other systems reviewed and are negative.    Physical Exam Updated Vital Signs BP 131/99   Pulse 101   Temp 98.3 F (36.8 C) (Oral)   Resp 20   Ht 5\' 2"  (1.575 m)   Wt 222 lb (100.7 kg)   SpO2 100%   BMI 40.60 kg/m   Physical Exam  Constitutional: She is oriented to person, place, and time. She appears well-developed and well-nourished. No distress.  HENT:  Head: Normocephalic.  Eyes: Conjunctivae are normal.  Neck: Neck supple. No tracheal deviation present.  Cardiovascular: Normal rate, regular rhythm and normal heart sounds.   Pulmonary/Chest: Effort normal and breath sounds normal. No respiratory distress.  Abdominal: Soft. She exhibits no distension. There is tenderness (LUQ and periumbilical). There is no rebound and no guarding. No hernia.  Neurological: She is alert and oriented to person, place, and time.  Skin: Skin is warm  and dry.  Psychiatric: She has a normal mood and affect.     ED Treatments / Results  Labs (all labs ordered are listed, but only abnormal results are displayed) Labs Reviewed  CBC WITH DIFFERENTIAL/PLATELET  COMPREHENSIVE METABOLIC PANEL  LIPASE, BLOOD  URINALYSIS, ROUTINE W REFLEX MICROSCOPIC (NOT AT [facility])  I-STAT CG4 LACTIC ACID, ED    EKG  EKG Interpretation None       Radiology No results found.  Procedures Procedures (including critical care time)  Medications Ordered in ED Medications  fentaNYL (SUBLIMAZE) injection 50 mcg (50 mcg Intravenous Given 12/30/15 1407)  ondansetron (ZOFRAN) injection 4 mg (4 mg Intravenous Given 12/30/15 1407)  sodium chloride 0.9 % bolus 1,000 mL (1,000 mLs Intravenous New Bag/Given 12/30/15 1300)     Initial Impression / Assessment and Plan / ED Course  I have reviewed the triage vital signs and the nursing notes.  Pertinent  labs & imaging results that were available during my care of the patient were reviewed by me and considered in my medical decision making (see chart for details).  Clinical Course    35 y.o. female presents with Recurrent abdominal pain that occurred over the course of the day. She has not had a bowel movement over the last 4 days and has a history of small bowel obstruction with multiple previous occurrences that are similar in nature. She has vomited today and lost appetite. She has had loss of weight over the last few weeks. She has a nondistended abdomen today with normal bowel sounds but with clinical history suggestive of obstruction a screening plain film set was ordered to evaluate bowel gas pattern and labs were drawn. Labs reassuring, x-ray without signs of high-grade obstruction.  The patient was given multiple doses of IV pain medication with minimal improvement of her symptoms. I recommended CT for definitive diagnosis versus expectant management at home with clear liquid diet and slowly advancing with  close monitoring of symptoms. The patient preferred to avoid CT imaging at this time due to multiple recent studies and risk of radiation. After shared decision making discussion I feel it is appropriate for the patient to monitor her symptoms from home. She was instructed to return immediately with signs of worsening, inability to tolerate liquids or other concerning symptoms. She has 60 Percocet tablets that were prescribed 23 days ago so should not require extra analgesia by mouth at home. Discussed use of enemas and other methods to relieve possible constipation which may be exacerbated by chronic opioid use.  Final Clinical Impressions(s) / ED Diagnoses   Final diagnoses:  Abdominal pain  Undifferentiated abdominal pain    New Prescriptions New Prescriptions   ONDANSETRON (ZOFRAN ODT) 4 MG DISINTEGRATING TABLET    Take 1 tablet (4 mg total) by mouth every 8 (eight) hours as needed for nausea or vomiting.     Lyndal Pulley, MD 12/30/15 1452

## 2015-12-30 NOTE — ED Triage Notes (Signed)
Abdominal pain

## 2016-01-05 DIAGNOSIS — K5900 Constipation, unspecified: Secondary | ICD-10-CM | POA: Diagnosis not present

## 2016-01-05 DIAGNOSIS — Z888 Allergy status to other drugs, medicaments and biological substances status: Secondary | ICD-10-CM | POA: Diagnosis not present

## 2016-01-05 DIAGNOSIS — R55 Syncope and collapse: Secondary | ICD-10-CM | POA: Diagnosis not present

## 2016-01-05 DIAGNOSIS — R1084 Generalized abdominal pain: Secondary | ICD-10-CM | POA: Diagnosis not present

## 2016-01-05 DIAGNOSIS — F329 Major depressive disorder, single episode, unspecified: Secondary | ICD-10-CM | POA: Diagnosis not present

## 2016-01-05 DIAGNOSIS — N898 Other specified noninflammatory disorders of vagina: Secondary | ICD-10-CM | POA: Diagnosis not present

## 2016-01-05 DIAGNOSIS — R112 Nausea with vomiting, unspecified: Secondary | ICD-10-CM | POA: Diagnosis not present

## 2016-01-05 DIAGNOSIS — R109 Unspecified abdominal pain: Secondary | ICD-10-CM | POA: Diagnosis not present

## 2016-01-05 DIAGNOSIS — Z885 Allergy status to narcotic agent status: Secondary | ICD-10-CM | POA: Diagnosis not present

## 2016-01-06 DIAGNOSIS — R0989 Other specified symptoms and signs involving the circulatory and respiratory systems: Secondary | ICD-10-CM | POA: Diagnosis not present

## 2016-01-06 DIAGNOSIS — F329 Major depressive disorder, single episode, unspecified: Secondary | ICD-10-CM | POA: Diagnosis not present

## 2016-01-06 DIAGNOSIS — Z885 Allergy status to narcotic agent status: Secondary | ICD-10-CM | POA: Diagnosis not present

## 2016-01-06 DIAGNOSIS — R55 Syncope and collapse: Secondary | ICD-10-CM | POA: Diagnosis not present

## 2016-01-06 DIAGNOSIS — Z888 Allergy status to other drugs, medicaments and biological substances status: Secondary | ICD-10-CM | POA: Diagnosis not present

## 2016-01-06 DIAGNOSIS — R112 Nausea with vomiting, unspecified: Secondary | ICD-10-CM | POA: Diagnosis not present

## 2016-01-06 DIAGNOSIS — K5900 Constipation, unspecified: Secondary | ICD-10-CM | POA: Diagnosis not present

## 2016-01-06 DIAGNOSIS — R109 Unspecified abdominal pain: Secondary | ICD-10-CM | POA: Diagnosis not present

## 2016-01-13 DIAGNOSIS — N882 Stricture and stenosis of cervix uteri: Secondary | ICD-10-CM | POA: Diagnosis not present

## 2016-01-13 DIAGNOSIS — N920 Excessive and frequent menstruation with regular cycle: Secondary | ICD-10-CM | POA: Diagnosis not present

## 2016-01-13 DIAGNOSIS — Z30433 Encounter for removal and reinsertion of intrauterine contraceptive device: Secondary | ICD-10-CM | POA: Diagnosis not present

## 2016-01-14 ENCOUNTER — Other Ambulatory Visit (HOSPITAL_COMMUNITY): Payer: Self-pay | Admitting: Neurological Surgery

## 2016-01-14 DIAGNOSIS — M4802 Spinal stenosis, cervical region: Secondary | ICD-10-CM

## 2016-01-18 ENCOUNTER — Ambulatory Visit (HOSPITAL_COMMUNITY): Admission: RE | Admit: 2016-01-18 | Payer: 59 | Source: Ambulatory Visit

## 2016-02-09 MED FILL — DULoxetine HCL 60 MG CPEP: 60 | 30 days supply | Qty: 30 | Fill #0

## 2016-02-09 MED FILL — tiZANidine HCL 4 MG TABS: 4 | 20 days supply | Qty: 60 | Fill #0

## 2016-02-15 DIAGNOSIS — R7301 Impaired fasting glucose: Secondary | ICD-10-CM | POA: Diagnosis not present

## 2016-02-15 DIAGNOSIS — G43909 Migraine, unspecified, not intractable, without status migrainosus: Secondary | ICD-10-CM | POA: Diagnosis not present

## 2016-02-15 DIAGNOSIS — F329 Major depressive disorder, single episode, unspecified: Secondary | ICD-10-CM | POA: Diagnosis not present

## 2016-02-23 MED FILL — tiZANidine HCL 4 MG TABS: 4 | 20 days supply | Qty: 60 | Fill #0

## 2016-02-29 MED FILL — DULoxetine HCL 60 MG CPEP: 60 | 30 days supply | Qty: 30 | Fill #0

## 2016-02-29 MED FILL — METFORMIN HCL ER 500 MG TAB: 500 | 30 days supply | Qty: 30 | Fill #0

## 2016-03-06 ENCOUNTER — Emergency Department (HOSPITAL_COMMUNITY): Payer: 59

## 2016-03-06 ENCOUNTER — Encounter (HOSPITAL_COMMUNITY): Payer: Self-pay

## 2016-03-06 ENCOUNTER — Emergency Department (HOSPITAL_COMMUNITY)
Admission: EM | Admit: 2016-03-06 | Discharge: 2016-03-06 | Disposition: A | Discharge status: Home / Self Care | Payer: 59 | Attending: Emergency Medicine | Admitting: Emergency Medicine

## 2016-03-06 DIAGNOSIS — Z7984 Long term (current) use of oral hypoglycemic drugs: Secondary | ICD-10-CM | POA: Insufficient documentation

## 2016-03-06 DIAGNOSIS — R112 Nausea with vomiting, unspecified: Secondary | ICD-10-CM | POA: Insufficient documentation

## 2016-03-06 DIAGNOSIS — R1033 Periumbilical pain: Secondary | ICD-10-CM | POA: Diagnosis not present

## 2016-03-06 DIAGNOSIS — R109 Unspecified abdominal pain: Secondary | ICD-10-CM | POA: Diagnosis not present

## 2016-03-06 DIAGNOSIS — R1084 Generalized abdominal pain: Secondary | ICD-10-CM | POA: Diagnosis not present

## 2016-03-06 LAB — CBC WITH DIFFERENTIAL/PLATELET
BASOS PCT: 0 %
Basophils Absolute: 0 10*3/uL (ref 0.0–0.1)
Eosinophils Absolute: 0.2 10*3/uL (ref 0.0–0.7)
Eosinophils Relative: 2 %
HEMATOCRIT: 41.2 % (ref 36.0–46.0)
HEMOGLOBIN: 14 g/dL (ref 12.0–15.0)
LYMPHS ABS: 2.8 10*3/uL (ref 0.7–4.0)
Lymphocytes Relative: 27 %
MCH: 30.2 pg (ref 26.0–34.0)
MCHC: 34 g/dL (ref 30.0–36.0)
MCV: 89 fL (ref 78.0–100.0)
MONO ABS: 0.6 10*3/uL (ref 0.1–1.0)
MONOS PCT: 5 %
NEUTROS ABS: 6.8 10*3/uL (ref 1.7–7.7)
NEUTROS PCT: 66 %
Platelets: 373 10*3/uL (ref 150–400)
RBC: 4.63 MIL/uL (ref 3.87–5.11)
RDW: 13 % (ref 11.5–15.5)
WBC: 10.4 10*3/uL (ref 4.0–10.5)

## 2016-03-06 LAB — URINALYSIS, ROUTINE W REFLEX MICROSCOPIC
BILIRUBIN URINE: NEGATIVE
Glucose, UA: NEGATIVE mg/dL
Hgb urine dipstick: NEGATIVE
KETONES UR: NEGATIVE mg/dL
LEUKOCYTES UA: NEGATIVE
NITRITE: NEGATIVE
PH: 6 (ref 5.0–8.0)
PROTEIN: NEGATIVE mg/dL
Specific Gravity, Urine: 1.005 (ref 1.005–1.030)

## 2016-03-06 LAB — POC OCCULT BLOOD, ED: FECAL OCCULT BLD: POSITIVE — AB

## 2016-03-06 LAB — COMPREHENSIVE METABOLIC PANEL
ALK PHOS: 67 U/L (ref 38–126)
ALT: 23 U/L (ref 14–54)
ANION GAP: 11 (ref 5–15)
AST: 28 U/L (ref 15–41)
Albumin: 4.2 g/dL (ref 3.5–5.0)
BILIRUBIN TOTAL: 0.7 mg/dL (ref 0.3–1.2)
BUN: 9 mg/dL (ref 6–20)
CALCIUM: 9.5 mg/dL (ref 8.9–10.3)
CO2: 21 mmol/L — ABNORMAL LOW (ref 22–32)
Chloride: 107 mmol/L (ref 101–111)
Creatinine, Ser: 0.69 mg/dL (ref 0.44–1.00)
GLUCOSE: 185 mg/dL — AB (ref 65–99)
POTASSIUM: 3.8 mmol/L (ref 3.5–5.1)
Sodium: 139 mmol/L (ref 135–145)
TOTAL PROTEIN: 7.3 g/dL (ref 6.5–8.1)

## 2016-03-06 LAB — LIPASE, BLOOD: LIPASE: 27 U/L (ref 11–51)

## 2016-03-06 LAB — I-STAT BETA HCG BLOOD, ED (MC, WL, AP ONLY): I-stat hCG, quantitative: <5 m[IU]/mL (ref <5)

## 2016-03-06 MED ORDER — ONDANSETRON HCL 4 MG/2ML IJ SOLN
4.0000 mg | Freq: Once | INTRAMUSCULAR | Status: AC
  Administered 2016-03-06: 4 mg via INTRAVENOUS
  Filled 2016-03-06: qty 2

## 2016-03-06 MED ORDER — DIPHENHYDRAMINE HCL 50 MG/ML IJ SOLN
25.0000 mg | Freq: Once | INTRAMUSCULAR | Status: AC
  Administered 2016-03-06: 25 mg via INTRAVENOUS
  Filled 2016-03-06: qty 1

## 2016-03-06 MED ORDER — LORAZEPAM 2 MG/ML IJ SOLN
1.0000 mg | Freq: Once | INTRAMUSCULAR | Status: AC
  Administered 2016-03-06: 1 mg via INTRAVENOUS
  Filled 2016-03-06: qty 1

## 2016-03-06 MED ORDER — HYDROMORPHONE HCL 2 MG/ML IJ SOLN
1.0000 mg | Freq: Once | INTRAMUSCULAR | Status: AC
  Administered 2016-03-06: 1 mg via INTRAVENOUS
  Filled 2016-03-06: qty 1

## 2016-03-06 MED ORDER — IOPAMIDOL (ISOVUE-300) INJECTION 61%
INTRAVENOUS | Status: AC
  Administered 2016-03-06: 100 mL
  Filled 2016-03-06: qty 100

## 2016-03-06 MED ORDER — MORPHINE SULFATE (PF) 4 MG/ML IV SOLN
6.0000 mg | Freq: Once | INTRAVENOUS | Status: AC
  Administered 2016-03-06: 6 mg via INTRAVENOUS
  Filled 2016-03-06: qty 2

## 2016-03-06 MED ORDER — SODIUM CHLORIDE 0.9 % IV BOLUS (SEPSIS)
1000.0000 mL | Freq: Once | INTRAVENOUS | Status: AC
  Administered 2016-03-06: 1000 mL via INTRAVENOUS

## 2016-03-06 MED ORDER — SODIUM CHLORIDE 0.9 % IV BOLUS (SEPSIS): 1000.0000 mL | Freq: Once | INTRAVENOUS | Status: DC

## 2016-03-06 MED ORDER — PROMETHAZINE HCL 25 MG/ML IJ SOLN
25.0000 mg | Freq: Once | INTRAMUSCULAR | Status: AC
  Administered 2016-03-06: 25 mg via INTRAVENOUS
  Filled 2016-03-06: qty 1

## 2016-03-06 NOTE — ED Notes (Addendum)
Pt returned from the xray. "Pain is still 8-9/10". MD notified and aware

## 2016-03-06 NOTE — ED Provider Notes (Signed)
[facility] Provider Note   CSN: [id number] Arrival date & time: 03/06/16  1344     History   Chief Complaint Chief Complaint  Patient presents with  . Abdominal Pain    pt with mid lower abdominal pain, pt also throwing up blood as per pt and noticed blood in stool     HPI Loretta Alexander is a 35 y.o. female.  HPI   35 year old female with history of small bowel obstruction in the past, gastritis, GERD, renal disorder, ovarian cyst presenting with complaint of abdominal pain. Patient states for the past 3 days she has had intermittent abdominal pain with associate nausea and vomiting. Pain became much more intense today while she is at work. 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 She described pain as a sharp stabbing pain, radiates across her abdomen, moderate in severity. She has had bouts of vomiting with trace of blood which she suspect from persistent vomiting. Denies fever, chills, lightheadedness, dizziness, chest pain, shortness of breath, dysuria. Last bowel movement was 5 days ago but she does not feel constipated. She had bowel resection this past August and has to be hospitalized 6 days. Her prior abdominal surgery includes appendectomy, cholecystectomy, right oophorectomy, adhesion takedown, gastric banding, and partial colonic resection. Patient denies any recent travel or eating exotic food.  Past Medical History:  Diagnosis Date  . Complication of anesthesia    Difficult IV stick with Central line placement, TACHY 180-190 S/P COLON SURGERY (resolved with treatment for pain)   . Depression   . Drug-seeking behavior   . Dysrhythmia    INCREASED HR AT TIMES  . Eye infection    within last 2 weeks  . Gastritis 03/2011   Per EGD  . GERD (gastroesophageal reflux disease)    OCC TUMS  . Intestinal obstruction   . Migraines   . Obesity   . Ovarian cyst, right    Hx of oophorectomy for adhesions  . Pneumonia    04/2010  . Renal disorder    Kidney Stones  . Thyroid nodule   . UTI (lower urinary tract infection)     Patient Active Problem List   Diagnosis Date Noted  . Vaginal discharge 12/18/2015  . Bowel obstruction 12/17/2015  . Dysphagia 05/09/2015  . Neck swelling 05/08/2015  . S/P cervical spinal fusion 04/28/2015  . Migraine headache 01/20/2013  . Diplopia 01/20/2013  . Left-sided weakness 01/20/2013  . Right sided abdominal pain 01/11/2012  . Hyperglycemia 01/11/2012  . Drug-seeking behavior 01/11/2012  . Obesity 01/11/2012  . Constipation 01/11/2012  . Depression 08/31/2011  . Gastritis 04/06/2011  . Abdominal pain 04/03/2011  . Foreign body 04/03/2011  . Nausea with vomiting 04/03/2011    Past Surgical History:  Procedure Laterality Date  . ABDOMINAL ADHESION SURGERY  2006   BIL FALLOPIAN TUBES REMOVED  . ANTERIOR CERVICAL DECOMP/DISCECTOMY FUSION N/A 04/28/2015   Procedure: Cervical five-six, Cervical six - seven Anterior cervical decompression/diskectomy/fusion;  Surgeon: Tia Alertavid S Jones, MD;  Location: [facility] NEUR[facility]S;  Service: Neurosurgery;  Laterality: N/A;  C5-6 C6-7 Anterior cervical decompression/diskectomy/fusion  . APPENDECTOMY  1992  . CHOLECYSTECTOMY  2003  . COLON SURGERY     MAY 2015     BOWEL OBST s/p partial bowel [facility]  . ESOPHAGOGASTRODUODENOSCOPY  04/05/2011   Procedure: ESOPHAGOGASTRODUODENOSCOPY (EGD);  Surgeon: Charna ElizabethJyothi Mann, MD;  Location: [facility] END[facility];  Service: Endoscopy;  Laterality: N/A;  . HERNIA REPAIR     umbilical  .  KNEE ARTHROSCOPY  2001, 2010   left knee x 2  . LAPAROSCOPIC GASTRIC BANDING    . NASAL SEPTOPLASTY W/ TURBINOPLASTY  03/16/2011   Procedure: NASAL SEPTOPLASTY WITH TURBINATE REDUCTION;  Surgeon: Susy Frizzle, MD;  Location: [facility] OR;  Service: ENT;  Laterality: Bilateral;  . PILONIDAL CYST EXCISION    . removal of lap band    . right ovary removed  2006  . TONSILLECTOMY  1991    OB History    No data available       Home  Medications    Prior to Admission medications   Medication Sig Start Date End Date Taking? Authorizing Provider  DULoxetine (CYMBALTA) 60 MG capsule Take 60 mg by mouth daily.    Yes Historical Provider, MD  levonorgestrel (MIRENA) 20 MCG/24HR IUD 1 each by Intrauterine route once. 2010   Yes Historical Provider, MD  metFORMIN (GLUCOPHAGE-XR) 500 MG 24 hr tablet Take 500 mg by mouth daily with breakfast.   Yes Historical Provider, MD  QUEtiapine (SEROQUEL) 50 MG tablet Take 150 mg by mouth at bedtime.   Yes Historical Provider, MD  tiZANidine (ZANAFLEX) 4 MG tablet Take 4 mg by mouth every 6 (six) hours as needed for muscle spasms.   Yes Historical Provider, MD  ondansetron (ZOFRAN ODT) 4 MG disintegrating tablet Take 1 tablet (4 mg total) by mouth every 8 (eight) hours as needed for nausea or vomiting. Patient not taking: Reported on 03/06/2016 12/30/15   Lyndal Pulley, MD    Family History No family history on file.  Social History Social History  Substance Use Topics  . Smoking status: Never Smoker  . Smokeless tobacco: Never Used  . Alcohol use Yes     Comment: rarely     Allergies   Prochlorperazine maleate; Compazine [prochlorperazine maleate]; Haloperidol and related; Reglan [metoclopramide hcl]; and Toradol [ketorolac tromethamine]   Review of Systems Review of Systems  All other systems reviewed and are negative.    Physical Exam Updated Vital Signs BP 115/75 (BP Location: Left Arm)   Pulse 113   Temp 98.1 F (36.7 C) (Oral)   Resp 18   Ht 5\' 2"  (1.575 m)   Wt 99.8 kg   SpO2 100%   BMI 40.24 kg/m   Physical Exam  Constitutional: She appears well-developed and well-nourished. No distress.  HENT:  Head: Atraumatic.  Eyes: Conjunctivae are normal.  Neck: Neck supple.  Cardiovascular:  Tachycardia without murmurs rubs or gallops  Pulmonary/Chest: Effort normal and breath sounds normal.  Abdominal: Soft.  Genitourinary:  Genitourinary Comments: Chaperone  present during exam. Normal rectal tone, no rectal mass, no evidence of stool impaction, trace of blood noted on glove. External hemorrhoid nonthrombosed.  Neurological: She is alert.  Skin: No rash noted.  Psychiatric: She has a normal mood and affect.  Nursing note and vitals reviewed.    ED Treatments / Results  Labs (all labs ordered are listed, but only abnormal results are displayed) Labs Reviewed  COMPREHENSIVE METABOLIC PANEL - Abnormal; Notable for the following:       Result Value   CO2 21 (*)    Glucose, Bld 185 (*)    All other components within normal limits  POC OCCULT BLOOD, ED - Abnormal; Notable for the following:    Fecal Occult Bld POSITIVE (*)    All other components within normal limits  CBC WITH DIFFERENTIAL/PLATELET  LIPASE, BLOOD  URINALYSIS, ROUTINE W REFLEX MICROSCOPIC (NOT AT [facility])  I-STAT BETA HCG  BLOOD, ED ([facility], [facility], [facility] ONLY)    EKG  EKG Interpretation None       Radiology Dg Abdomen Acute W/chest  Result Date: 03/06/2016 CLINICAL DATA:  Mid abdominal pain associated with nausea and vomiting for the past 3 days. History of small-bowel obstruction. The patient could not tolerate lying supine. EXAM: DG ABDOMEN ACUTE W/ 1V CHEST COMPARISON:  KUB of December 30, 2015 FINDINGS: Chest x-ray: The lungs are hypoinflated but clear. The heart is mildly enlarged. The pulmonary vascularity is normal. There is no pleural effusion. The bony thorax exhibits no acute abnormality. Within the abdomen there are a few E air-fluid levels demonstrated in the midline and to the right. No free extraluminal gas collections are observed. There surgical clips in the gallbladder fossa. The bony structures exhibit no acute abnormalities. IMPRESSION: 1. Bilateral hypo inflation. Mild enlargement of the cardiac silhouette without pulmonary vascular congestion. 2. Fairly nonspecific bowel gas pattern. A few small air-fluid levels are noted which could reflect an ileus or partial small  or large bowel obstruction. The lower abdomen is not included in the field of view. There is no evidence of perforation. Electronically Signed   By: David  [place] M.D.   On: 03/06/2016 16:20    Procedures Procedures (including critical care time)  Medications Ordered in ED Medications  sodium chloride 0.9 % bolus 1,000 mL (1,000 mLs Intravenous Refused 03/06/16 1946)  sodium chloride 0.9 % bolus 1,000 mL (0 mLs Intravenous Stopped 03/06/16 1544)  ondansetron (ZOFRAN) injection 4 mg (4 mg Intravenous Given 03/06/16 1413)  promethazine (PHENERGAN) injection 25 mg (25 mg Intravenous Given 03/06/16 1545)  morphine 4 MG/ML injection 6 mg (6 mg Intravenous Given 03/06/16 1545)  iopamidol (ISOVUE-300) 61 % injection (100 mLs  Contrast Given 03/06/16 1850)  HYDROmorphone (DILAUDID) injection 1 mg (1 mg Intravenous Given 03/06/16 1641)  diphenhydrAMINE (BENADRYL) injection 25 mg (25 mg Intravenous Given 03/06/16 1702)  HYDROmorphone (DILAUDID) injection 1 mg (1 mg Intravenous Given 03/06/16 1811)  diphenhydrAMINE (BENADRYL) injection 25 mg (25 mg Intravenous Given 03/06/16 1810)  LORazepam (ATIVAN) injection 1 mg (1 mg Intravenous Given 03/06/16 1940)  HYDROmorphone (DILAUDID) injection 1 mg (1 mg Intravenous Given 03/06/16 1940)  ondansetron (ZOFRAN) injection 4 mg (4 mg Intravenous Given 03/06/16 1940)     Initial Impression / Assessment and Plan / ED Course  I have reviewed the triage vital signs and the nursing notes.  Pertinent labs & imaging results that were available during my care of the patient were reviewed by me and considered in my medical decision making (see chart for details).  Clinical Course    BP 115/73   Pulse (!) 128   Temp 98.1 F (36.7 C) (Oral)   Resp (!) 3   Ht 5\' 2"  (1.575 m)   Wt 99.8 kg   SpO2 97%   BMI 40.24 kg/m  Tachycardia 2/2 vomiting and anxiousness.  Pt has received IVF. Resp 3 is a faulty encoding. Will recheck vital sign.  Final Clinical  Impressions(s) / ED Diagnoses   Final diagnoses:  Generalized abdominal pain  Nausea and vomiting, intractability of vomiting not specified, unspecified vomiting type    New Prescriptions New Prescriptions   No medications on file   5:56 PM Patient presents with complaints of abdominal pain, nausea, vomiting, history of small bowel obstruction in the past. She felt that her symptom is similar to prior small bowel obstruction. She is actively vomiting in the ED. Patient received several doses of pain medication  and antinausea medication but his symptoms persist. An acute abdominal series did show some slight changes to suggest early ileus versus small bowel obstruction. We'll follow-up with an abdominal and pelvis CT scan further evaluation.  7:29 PM Fecal occult blood is positive. Otherwise labs are reassuring. Abdominal pelvis CT scan without acute finding to suggest SBO other acute pathology. I discussed with the patient, she was concerned that she had similar presentation with a normal CT scan initially but subsequently had abdominal surgery 3 days afterward. I offer patient hospital admission for observation and symptom treatment the patient felt comfortable going home. She request additional pain medication. Will provide pain management. I am concerned for narcotic overuse. I encouraged patient to follow-up with GI specialist and with primary care provider for further care. Return percussion discussed.  I offer Bentyl, pt declined.       Fayrene HelperBowie Karmella Bouvier, PA-C 03/07/16 [id number]    Maia PlanJoshua G Long, MD 03/07/16 [phone]

## 2016-03-06 NOTE — ED Triage Notes (Signed)
Pt has significant hx of bowel obstruction comes in today while working had sudden increase in pain with throwing up blood and blood in stool

## 2016-03-06 NOTE — ED Notes (Signed)
Pt transferred to x ray

## 2016-03-06 NOTE — ED Provider Notes (Signed)
MSE was initiated and I personally evaluated the patient and placed orders (if any) at  1:50 PM on March 06, 2016 at request of charge RN.  The patient appears stable so that the remainder of the MSE may be completed by another provider.  35 y.o. 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  States pain localized around navel with radiation to back.  Reports some blood in stool and emesis.  Hx of SBO, hospitalized in August for 6 days.  Also has hx of gastritis.  Prior abdominal surgeries include appendectomy, cholecystectomy, right oophorectomy, adhesion takedown, gastric banding, partial colonic resection.    Labs including CBC, CMP, Lipase, hcg, u/a ordered.  IV started, given fluids and zofran.  Patient moved to acute care bed for further evaluation.   Garlon HatchetLisa M Lionel Woodberry, PA-C 03/06/16 1414    Maia PlanJoshua G Long, MD 03/06/16 2001

## 2016-03-06 NOTE — Discharge Instructions (Signed)
Please call and follow up with GI specialist for further evaluation of your abdominal discomfort, nausea and vomiting.  Return to the ER if you have any concerns.

## 2016-03-10 MED FILL — tiZANidine HCL 4 MG TABS: 4 | 20 days supply | Qty: 60 | Fill #0

## 2016-03-10 MED FILL — QUETIAPINE FUMARATE 50 MG T: 50 | 30 days supply | Qty: 90 | Fill #0

## 2016-04-05 MED FILL — tiZANidine HCL 4 MG TABS: 4 | 20 days supply | Qty: 60 | Fill #1

## 2016-04-05 MED FILL — METFORMIN HCL ER 500 MG TAB: 500 | 30 days supply | Qty: 30 | Fill #1

## 2016-04-05 MED FILL — DULoxetine HCL 60 MG CPEP: 60 | 30 days supply | Qty: 30 | Fill #1

## 2016-04-05 MED FILL — QUETIAPINE FUMARATE 50 MG T: 50 | 30 days supply | Qty: 90 | Fill #1

## 2016-04-09 DIAGNOSIS — Z7984 Long term (current) use of oral hypoglycemic drugs: Secondary | ICD-10-CM | POA: Diagnosis not present

## 2016-04-09 DIAGNOSIS — R1033 Periumbilical pain: Secondary | ICD-10-CM | POA: Diagnosis not present

## 2016-04-09 DIAGNOSIS — R112 Nausea with vomiting, unspecified: Secondary | ICD-10-CM | POA: Diagnosis not present

## 2016-04-09 DIAGNOSIS — G43909 Migraine, unspecified, not intractable, without status migrainosus: Secondary | ICD-10-CM | POA: Diagnosis not present

## 2016-04-09 DIAGNOSIS — Z79899 Other long term (current) drug therapy: Secondary | ICD-10-CM | POA: Diagnosis not present

## 2016-04-09 DIAGNOSIS — K219 Gastro-esophageal reflux disease without esophagitis: Secondary | ICD-10-CM | POA: Diagnosis not present

## 2016-04-09 DIAGNOSIS — K529 Noninfective gastroenteritis and colitis, unspecified: Secondary | ICD-10-CM | POA: Diagnosis not present

## 2016-04-09 DIAGNOSIS — A419 Sepsis, unspecified organism: Secondary | ICD-10-CM | POA: Diagnosis not present

## 2016-04-09 DIAGNOSIS — R1032 Left lower quadrant pain: Secondary | ICD-10-CM | POA: Diagnosis not present

## 2016-04-09 DIAGNOSIS — R7303 Prediabetes: Secondary | ICD-10-CM | POA: Diagnosis not present

## 2016-04-10 DIAGNOSIS — K219 Gastro-esophageal reflux disease without esophagitis: Secondary | ICD-10-CM | POA: Diagnosis not present

## 2016-04-10 DIAGNOSIS — Z7984 Long term (current) use of oral hypoglycemic drugs: Secondary | ICD-10-CM | POA: Diagnosis not present

## 2016-04-10 DIAGNOSIS — R109 Unspecified abdominal pain: Secondary | ICD-10-CM | POA: Diagnosis not present

## 2016-04-10 DIAGNOSIS — K529 Noninfective gastroenteritis and colitis, unspecified: Secondary | ICD-10-CM | POA: Diagnosis not present

## 2016-04-10 DIAGNOSIS — Z452 Encounter for adjustment and management of vascular access device: Secondary | ICD-10-CM | POA: Diagnosis not present

## 2016-04-10 DIAGNOSIS — R112 Nausea with vomiting, unspecified: Secondary | ICD-10-CM | POA: Diagnosis not present

## 2016-04-10 DIAGNOSIS — R7303 Prediabetes: Secondary | ICD-10-CM | POA: Diagnosis not present

## 2016-04-10 DIAGNOSIS — G43909 Migraine, unspecified, not intractable, without status migrainosus: Secondary | ICD-10-CM | POA: Diagnosis not present

## 2016-04-10 DIAGNOSIS — A419 Sepsis, unspecified organism: Secondary | ICD-10-CM | POA: Diagnosis not present

## 2016-04-10 DIAGNOSIS — Z79899 Other long term (current) drug therapy: Secondary | ICD-10-CM | POA: Diagnosis not present

## 2016-04-11 DIAGNOSIS — Z7984 Long term (current) use of oral hypoglycemic drugs: Secondary | ICD-10-CM | POA: Diagnosis not present

## 2016-04-11 DIAGNOSIS — Z79899 Other long term (current) drug therapy: Secondary | ICD-10-CM | POA: Diagnosis not present

## 2016-04-11 DIAGNOSIS — A419 Sepsis, unspecified organism: Secondary | ICD-10-CM | POA: Diagnosis not present

## 2016-04-11 DIAGNOSIS — K219 Gastro-esophageal reflux disease without esophagitis: Secondary | ICD-10-CM | POA: Diagnosis not present

## 2016-04-11 DIAGNOSIS — K529 Noninfective gastroenteritis and colitis, unspecified: Secondary | ICD-10-CM | POA: Diagnosis not present

## 2016-04-11 DIAGNOSIS — R7303 Prediabetes: Secondary | ICD-10-CM | POA: Diagnosis not present

## 2016-04-11 DIAGNOSIS — R112 Nausea with vomiting, unspecified: Secondary | ICD-10-CM | POA: Diagnosis not present

## 2016-04-11 DIAGNOSIS — G43909 Migraine, unspecified, not intractable, without status migrainosus: Secondary | ICD-10-CM | POA: Diagnosis not present

## 2016-04-12 DIAGNOSIS — R112 Nausea with vomiting, unspecified: Secondary | ICD-10-CM | POA: Diagnosis not present

## 2016-04-12 DIAGNOSIS — K219 Gastro-esophageal reflux disease without esophagitis: Secondary | ICD-10-CM | POA: Diagnosis not present

## 2016-04-12 DIAGNOSIS — R1110 Vomiting, unspecified: Secondary | ICD-10-CM | POA: Diagnosis not present

## 2016-04-12 DIAGNOSIS — R109 Unspecified abdominal pain: Secondary | ICD-10-CM | POA: Diagnosis not present

## 2016-04-12 DIAGNOSIS — Z7984 Long term (current) use of oral hypoglycemic drugs: Secondary | ICD-10-CM | POA: Diagnosis not present

## 2016-04-12 DIAGNOSIS — R7303 Prediabetes: Secondary | ICD-10-CM | POA: Diagnosis not present

## 2016-04-12 DIAGNOSIS — K529 Noninfective gastroenteritis and colitis, unspecified: Secondary | ICD-10-CM | POA: Diagnosis not present

## 2016-04-12 DIAGNOSIS — G43909 Migraine, unspecified, not intractable, without status migrainosus: Secondary | ICD-10-CM | POA: Diagnosis not present

## 2016-04-12 DIAGNOSIS — Z79899 Other long term (current) drug therapy: Secondary | ICD-10-CM | POA: Diagnosis not present

## 2016-04-12 DIAGNOSIS — A419 Sepsis, unspecified organism: Secondary | ICD-10-CM | POA: Diagnosis not present

## 2016-04-28 MED FILL — tiZANidine HCL 4 MG TABS: 4 | 20 days supply | Qty: 60 | Fill #0

## 2016-05-02 MED FILL — QUETIAPINE FUMARATE 50 MG T: 50 | 30 days supply | Qty: 90 | Fill #2

## 2016-05-02 MED FILL — METFORMIN HCL ER 500 MG TAB: 500 | 90 days supply | Qty: 90 | Fill #2

## 2016-05-02 MED FILL — DULoxetine HCL 60 MG CPEP: 60 | 30 days supply | Qty: 30 | Fill #2

## 2016-05-09 ENCOUNTER — Ambulatory Visit: Payer: BLUE CROSS/BLUE SHIELD | Admitting: Neurology

## 2016-05-18 MED FILL — tiZANidine HCL 4 MG TABS: 4 | 20 days supply | Qty: 60 | Fill #0

## 2016-06-15 MED FILL — tiZANidine HCL 4 MG TABS: 4 | 20 days supply | Qty: 60 | Fill #0

## 2016-06-15 MED FILL — QUETIAPINE FUMARATE 50 MG T: 50 | 30 days supply | Qty: 90 | Fill #3

## 2016-06-16 ENCOUNTER — Other Ambulatory Visit: Payer: Self-pay | Admitting: *Deleted

## 2016-06-16 ENCOUNTER — Ambulatory Visit (INDEPENDENT_AMBULATORY_CARE_PROVIDER_SITE_OTHER): Payer: 59 | Admitting: Neurology

## 2016-06-16 ENCOUNTER — Encounter: Payer: Self-pay | Admitting: Neurology

## 2016-06-16 VITALS — BP 128/82 | HR 124 | Ht 62.0 in | Wt 230.1 lb

## 2016-06-16 DIAGNOSIS — G43409 Hemiplegic migraine, not intractable, without status migrainosus: Secondary | ICD-10-CM | POA: Diagnosis not present

## 2016-06-16 DIAGNOSIS — G43709 Chronic migraine without aura, not intractable, without status migrainosus: Secondary | ICD-10-CM | POA: Diagnosis not present

## 2016-06-16 MED ORDER — PROPRANOLOL HCL ER 80 MG PO CP24: 80.0000 mg | ORAL_CAPSULE | Freq: Every day | ORAL | 2 refills | Status: DC

## 2016-06-16 MED ORDER — TRAMADOL-ACETAMINOPHEN 37.5-325 MG PO TABS: 1.0000 | ORAL_TABLET | Freq: Four times a day (QID) | ORAL | 0 refills | Status: DC | PRN

## 2016-06-16 MED FILL — TRAMADOL-APAP 37.5-325 TAB: 37.5-325 | 4 days supply | Qty: 30 | Fill #0

## 2016-06-16 NOTE — Patient Instructions (Signed)
Migraine Recommendations: 1.  Start propranolol ER 80mg  daily.  Call in 4 weeks with update and we can adjust dose if needed. 2.  Take acetaminophen-tramadol pill for acute headache.  Caution for serotonin syndrome with Cymbalta. 3.  Limit use of pain relievers to no more than 2 days out of the week.  These medications include acetaminophen, ibuprofen, triptans and narcotics.  This will help reduce risk of rebound headaches. 4.  Be aware of common food triggers such as processed sweets, processed foods with nitrites (such as deli meat, hot dogs, sausages), foods with MSG, alcohol (such as wine), chocolate, certain cheeses, certain fruits (dried fruits, some citrus fruit), vinegar, diet soda. 4.  Avoid caffeine 5.  Routine exercise 6.  Proper sleep hygiene 7.  Stay adequately hydrated with water 8.  Keep a headache diary. 9.  Maintain proper stress management. 10.  Do not skip meals. 11.  Consider supplements:  Magnesium citrate 400mg  to 600mg  daily, riboflavin 400mg , Coenzyme Q 10 100mg  three times daily 12.  Follow up in 3 months but contact me in 4 weeks with update (before refilling propranolol) and we can increase dose if needed.

## 2016-06-16 NOTE — Progress Notes (Signed)
NEUROLOGY CONSULTATION NOTE  Loretta Alexander MRN: [id number] DOB: 10-11-1980  Referring provider: Horton MarshallAnna Becker, PA-C Primary care provider: Horton MarshallAnna Becker, PA-C  Reason for consult:  migraine  HISTORY OF PRESENT ILLNESS: Loretta MinorsStacy K. Alexander is a 36 year old female with migarines, depression and history of kidney stones and kidney infection who presents for migraines.  Onset:  36 years old following a concussion Location:  Bilateral parietal/occipital Quality:  Sharp/stabbing Intensity:  10/10 Aura:  Left sided numbness and weakness of arm and leg (one episode with left facial droop as well) Prodrome:  no Associated symptoms:  Nausea, vomiting, photophobia, vision loss of upper half of visual field with spots.  No new thunderclap headache or severe headache waking her up from sleep. Duration:  All day Frequency:  Has daily bifrontal non-throbbing headache, but severe migraine occurs every 2 weeks Frequency of abortive medication: ibuprofen daily Triggers/exacerbating factors:  Caffeine, smells Relieving factors:  sleep Activity:  Cannot function when severe migraine  Past NSAIDS:  Aleve Past analgesics:  Tylenol Past abortive triptans:  Imitrex, Relpax Past anti-emetic:  Zofran Past antihypertensive medications:  Propranolol (stopped many years ago) Past antidepressant medications:  Amitriptyline (but only for sleep, not headache) Past anticonvulsant medications:  Topiramate, Keppra Past vitamins/Herbal/Supplements:  no Other past therapies:  no  Current NSAIDS:  Ibuprofen daily Current analgesics:  no Current triptans:  no Current anti-emetic:  promethazine Current muscle relaxants:  no Current anti-anxiolytic:  no Current sleep aide:  no Current Antihypertensive medications:  no Current Antidepressant medications:  Cymbalta 60mg  Current Anticonvulsant medications:  no Current Vitamins/Herbal/Supplements:  magnesium Current Antihistamines/Decongestants:  no Other therapy:   no Birth control:  Mirena IUD Other medication:  Seroquel 150mg  at bedtime (initially took as needed for headache abortive medication, now takes daily to help sleep)  Caffeine:  no Alcohol:  rarely Smoker:  no Diet:  Hydrates.  No processed foods or soda Exercise:  routine Depression/anxiety:  controlled Sleep hygiene:  poor Family history of headache:  father  02/16/16 Labs:  CBC with WBC 10.9, HGB 14, HCT 40, PLT 386; CMP with Na 140, K 3.9, Cl 107, CO2 21, glucose 110, BUN 13, Cr 0.60, total bili 0.4, ALP 74, AST 13 and ALT 20.  MRI of brain without contrast from 01/21/13, to evaluate for headache, was personally reviewed and was unremarkable.  PAST MEDICAL HISTORY: Past Medical History:  Diagnosis Date  . Complication of anesthesia    Difficult IV stick with Central line placement, TACHY 180-190 S/P COLON SURGERY (resolved with treatment for pain)   . Depression   . Drug-seeking behavior   . Dysrhythmia    INCREASED HR AT TIMES  . Eye infection    within last 2 weeks  . Gastritis 03/2011   Per EGD  . GERD (gastroesophageal reflux disease)    OCC TUMS  . Intestinal obstruction   . Migraines   . Obesity   . Ovarian cyst, right    Hx of oophorectomy for adhesions  . Pneumonia    04/2010  . Renal disorder    Kidney Stones  . Thyroid nodule   . UTI (lower urinary tract infection)     PAST SURGICAL HISTORY: Past Surgical History:  Procedure Laterality Date  . ABDOMINAL ADHESION SURGERY  2006   BIL FALLOPIAN TUBES REMOVED  . ANTERIOR CERVICAL DECOMP/DISCECTOMY FUSION N/A 04/28/2015   Procedure: Cervical five-six, Cervical six - seven Anterior cervical decompression/diskectomy/fusion;  Surgeon: Tia Alertavid S Jones, MD;  Location: [facility];  Service: Neurosurgery;  Laterality: N/A;  C5-6 C6-7 Anterior cervical decompression/diskectomy/fusion  . APPENDECTOMY  1992  . CHOLECYSTECTOMY  2003  . COLON SURGERY     MAY 2015     BOWEL OBST s/p partial bowel resection [facility])  . ESOPHAGOGASTRODUODENOSCOPY  04/05/2011   Procedure: ESOPHAGOGASTRODUODENOSCOPY (EGD);  Surgeon: Charna Elizabeth, MD;  Location: [facility];  Service: Endoscopy;  Laterality: N/A;  . HERNIA REPAIR     umbilical  . KNEE ARTHROSCOPY  2001, 2010   left knee x 2  . LAPAROSCOPIC GASTRIC BANDING    . NASAL SEPTOPLASTY W/ TURBINOPLASTY  03/16/2011   Procedure: NASAL SEPTOPLASTY WITH TURBINATE REDUCTION;  Surgeon: Susy Frizzle, MD;  Location: [facility] OR;  Service: ENT;  Laterality: Bilateral;  . PILONIDAL CYST EXCISION    . removal of lap band    . right ovary removed  2006  . TONSILLECTOMY  1991    MEDICATIONS: Current Outpatient Prescriptions on File Prior to Visit  Medication Sig Dispense Refill  . DULoxetine (CYMBALTA) 60 MG capsule Take 60 mg by mouth daily.     Marland Kitchen levonorgestrel (MIRENA) 20 MCG/24HR IUD 1 each by Intrauterine route once. 2010    . metFORMIN (GLUCOPHAGE-XR) 500 MG 24 hr tablet Take 500 mg by mouth daily with breakfast.    . QUEtiapine (SEROQUEL) 50 MG tablet Take 150 mg by mouth at bedtime.    Marland Kitchen tiZANidine (ZANAFLEX) 4 MG tablet Take 4 mg by mouth every 6 (six) hours as needed for muscle spasms.    . ondansetron (ZOFRAN ODT) 4 MG disintegrating tablet Take 1 tablet (4 mg total) by mouth every 8 (eight) hours as needed for nausea or vomiting. (Patient not taking: Reported on 03/06/2016) 20 tablet 0   No current facility-administered medications on file prior to visit.     ALLERGIES: Allergies  Allergen Reactions  . Prochlorperazine Maleate Other (See Comments)    Patient can tolerate phenergan Major anxiety attack  . Dilaudid [Hydromorphone Hcl] Itching    Ok with benadryl  . Compazine [Prochlorperazine Maleate] Anxiety and Other (See Comments)    dizziness  . Haloperidol And Related Anxiety and Other (See Comments)    dizziness  . Reglan [Metoclopramide Hcl] Anxiety and Other (See Comments)    dizziness  . Toradol [Ketorolac Tromethamine] Nausea And Vomiting      FAMILY HISTORY: Father- migraines  SOCIAL HISTORY: Social History   Social History  . Marital status: Divorced    Spouse name: N/A  . Number of children: N/A  . Years of education: N/A   Occupational History  . Not on file.   Social History Main Topics  . Smoking status: Never Smoker  . Smokeless tobacco: Never Used  . Alcohol use Yes     Comment: rarely  . Drug use: No  . Sexual activity: Yes    Birth control/ protection: IUD   Other Topics Concern  . Not on file   Social History Narrative  . No narrative on file    REVIEW OF SYSTEMS: Constitutional: No fevers, chills, or sweats, no generalized fatigue, change in appetite Eyes: No visual changes, double vision, eye pain Ear, nose and throat: No hearing loss, ear pain, nasal congestion, sore throat Cardiovascular: No chest pain, palpitations Respiratory:  No shortness of breath at rest or with exertion, wheezes GastrointestinaI: No nausea, vomiting, diarrhea, abdominal pain, fecal incontinence Genitourinary:  No dysuria, urinary retention or frequency Musculoskeletal:  No neck pain, back pain Integumentary: No rash, pruritus,  skin lesions Neurological: as above Psychiatric: No depression, insomnia, anxiety Endocrine: No palpitations, fatigue, diaphoresis, mood swings, change in appetite, change in weight, increased thirst Hematologic/Lymphatic:  No purpura, petechiae. Allergic/Immunologic: no itchy/runny eyes, nasal congestion, recent allergic reactions, rashes  PHYSICAL EXAM: Vitals:   06/16/16 1356  BP: 128/82  Pulse: (!) 124   General: No acute distress.  Patient appears well-groomed.  Head:  Normocephalic/atraumatic Eyes:  fundi examined but not visualized Neck: supple, no paraspinal tenderness, full range of motion Back: No paraspinal tenderness Heart: regular rate and rhythm Lungs: Clear to auscultation bilaterally. Vascular: No carotid bruits. Neurological Exam: Mental status: alert and  oriented to person, place, and time, recent and remote memory intact, fund of knowledge intact, attention and concentration intact, speech fluent and not dysarthric, language intact. Cranial nerves: CN I: not tested CN II: pupils equal, round and reactive to light, visual fields intact CN III, IV, VI:  full range of motion, no nystagmus, no ptosis CN V: facial sensation intact CN VII: upper and lower face symmetric CN VIII: hearing intact CN IX, X: gag intact, uvula midline CN XI: sternocleidomastoid and trapezius muscles intact CN XII: tongue midline Bulk & Tone: normal, no fasciculations. Motor:  5/5 throughout  Sensation: temperature and vibration sensation intact. Deep Tendon Reflexes:  2+ throughout, toes downgoing.  Finger to nose testing:  Without dysmetria.  Heel to shin:  Without dysmetria.  Gait:  Normal station and stride.  Able to turn and tandem walk. Romberg negative.  IMPRESSION: Chronic tension type headache Hemiplegic migraine Tachycardia  PLAN: 1.  Start propranolol ER 80mg  daily.  Call in 4 weeks with update and we can adjust dose if needed.  She has tachycardia, and this may be helpful for that as well.  Advised to discuss tachycardia with her PCP. 2.  Triptans are contraindicated.  Take acetaminophen-tramadol pill for acute headache.  Discussed and advised caution for serotonin syndrome with Cymbalta. 3.  Limit use of pain relievers to no more than 2 days out of the week.  These medications include acetaminophen, ibuprofen, triptans and narcotics.  This will help reduce risk of rebound headaches. 4.  Be aware of common food triggers such as processed sweets, processed foods with nitrites (such as deli meat, hot dogs, sausages), foods with MSG, alcohol (such as wine), chocolate, certain cheeses, certain fruits (dried fruits, some citrus fruit), vinegar, diet soda. 4.  Avoid caffeine 5.  Routine exercise 6.  Proper sleep hygiene 7.  Stay adequately hydrated with  water 8.  Keep a headache diary. 9.  Maintain proper stress management. 10.  Do not skip meals. 11.  Consider supplements:  Magnesium citrate 400mg  to 600mg  daily, riboflavin 400mg , Coenzyme Q 10 100mg  three times daily 12.  Follow up in 3 months but contact me in 4 weeks with update (before refilling propranolol) and we can increase dose if needed.   Thank you for allowing me to take part in the care of this patient.  Shon Millet, DO  CC: Horton Marshall, PA-C

## 2016-06-16 NOTE — Progress Notes (Signed)
Note routed

## 2016-06-26 ENCOUNTER — Encounter (HOSPITAL_COMMUNITY): Payer: Self-pay | Admitting: Nurse Practitioner

## 2016-06-26 ENCOUNTER — Emergency Department (HOSPITAL_COMMUNITY)
Admission: EM | Admit: 2016-06-26 | Discharge: 2016-06-26 | Disposition: A | Discharge status: Home / Self Care | Payer: 59 | Attending: Emergency Medicine | Admitting: Emergency Medicine

## 2016-06-26 ENCOUNTER — Emergency Department (HOSPITAL_COMMUNITY): Payer: 59

## 2016-06-26 DIAGNOSIS — R1033 Periumbilical pain: Secondary | ICD-10-CM | POA: Diagnosis not present

## 2016-06-26 DIAGNOSIS — Z7984 Long term (current) use of oral hypoglycemic drugs: Secondary | ICD-10-CM | POA: Diagnosis not present

## 2016-06-26 DIAGNOSIS — R109 Unspecified abdominal pain: Secondary | ICD-10-CM | POA: Diagnosis not present

## 2016-06-26 DIAGNOSIS — R1032 Left lower quadrant pain: Secondary | ICD-10-CM | POA: Diagnosis not present

## 2016-06-26 DIAGNOSIS — R1110 Vomiting, unspecified: Secondary | ICD-10-CM | POA: Diagnosis not present

## 2016-06-26 DIAGNOSIS — R1031 Right lower quadrant pain: Secondary | ICD-10-CM | POA: Diagnosis not present

## 2016-06-26 DIAGNOSIS — R112 Nausea with vomiting, unspecified: Secondary | ICD-10-CM | POA: Insufficient documentation

## 2016-06-26 LAB — CBC
HCT: 39.4 % (ref 36.0–46.0)
Hemoglobin: 13.5 g/dL (ref 12.0–15.0)
MCH: 30.5 pg (ref 26.0–34.0)
MCHC: 34.3 g/dL (ref 30.0–36.0)
MCV: 88.9 fL (ref 78.0–100.0)
Platelets: 444 10*3/uL — ABNORMAL HIGH (ref 150–400)
RBC: 4.43 MIL/uL (ref 3.87–5.11)
RDW: 13.5 % (ref 11.5–15.5)
WBC: 10.6 10*3/uL — ABNORMAL HIGH (ref 4.0–10.5)

## 2016-06-26 LAB — COMPREHENSIVE METABOLIC PANEL
ALBUMIN: 3.9 g/dL (ref 3.5–5.0)
ALK PHOS: 70 U/L (ref 38–126)
ALT: 39 U/L (ref 14–54)
AST: 30 U/L (ref 15–41)
Anion gap: 9 (ref 5–15)
BUN: 11 mg/dL (ref 6–20)
CALCIUM: 9.1 mg/dL (ref 8.9–10.3)
CO2: 24 mmol/L (ref 22–32)
CREATININE: 0.81 mg/dL (ref 0.44–1.00)
Chloride: 105 mmol/L (ref 101–111)
GFR calc Af Amer: >60 mL/min (ref >60)
GFR calc non Af Amer: >60 mL/min (ref >60)
GLUCOSE: 140 mg/dL — AB (ref 65–99)
Potassium: 4 mmol/L (ref 3.5–5.1)
Sodium: 138 mmol/L (ref 135–145)
TOTAL PROTEIN: 7.2 g/dL (ref 6.5–8.1)
Total Bilirubin: 0.6 mg/dL (ref 0.3–1.2)

## 2016-06-26 LAB — URINALYSIS, ROUTINE W REFLEX MICROSCOPIC
BILIRUBIN URINE: NEGATIVE
Glucose, UA: NEGATIVE mg/dL
HGB URINE DIPSTICK: NEGATIVE
KETONES UR: NEGATIVE mg/dL
Leukocytes, UA: NEGATIVE
Nitrite: NEGATIVE
Protein, ur: NEGATIVE mg/dL
Specific Gravity, Urine: 1.014 (ref 1.005–1.030)
pH: 7 (ref 5.0–8.0)

## 2016-06-26 LAB — I-STAT BETA HCG BLOOD, ED (MC, WL, AP ONLY)

## 2016-06-26 MED ORDER — SODIUM CHLORIDE 0.9 % IV BOLUS (SEPSIS)
1000.0000 mL | Freq: Once | INTRAVENOUS | Status: AC
  Administered 2016-06-26: 1000 mL via INTRAVENOUS

## 2016-06-26 MED ORDER — MORPHINE SULFATE (PF) 4 MG/ML IV SOLN
4.0000 mg | Freq: Once | INTRAVENOUS | Status: AC
  Administered 2016-06-26: 4 mg via INTRAVENOUS
  Filled 2016-06-26: qty 1

## 2016-06-26 MED ORDER — DICYCLOMINE HCL 20 MG PO TABS: 20.0000 mg | ORAL_TABLET | Freq: Two times a day (BID) | ORAL | 0 refills | Status: DC

## 2016-06-26 MED ORDER — IOPAMIDOL (ISOVUE-300) INJECTION 61%
INTRAVENOUS | Status: AC
  Administered 2016-06-26: 100 mL
  Filled 2016-06-26: qty 100

## 2016-06-26 MED ORDER — ONDANSETRON 8 MG PO TBDP: 8.0000 mg | ORAL_TABLET | Freq: Three times a day (TID) | ORAL | 0 refills | Status: DC | PRN

## 2016-06-26 MED ORDER — ONDANSETRON HCL 4 MG/2ML IJ SOLN
4.0000 mg | Freq: Once | INTRAMUSCULAR | Status: AC
  Administered 2016-06-26: 4 mg via INTRAVENOUS
  Filled 2016-06-26: qty 2

## 2016-06-26 NOTE — ED Triage Notes (Signed)
Pt presents with c/o abd pain. The pain began this morning. The pain is periumbilical and radiating into her LLQ. The pain is sharp and severe. She reports nausea, vomiting, constipation. Her last bowel movement was 6 days ago. She has been taking laxatives es with no relief. This feels like when shes had a bowel obstruction in the past.

## 2016-06-26 NOTE — ED Provider Notes (Signed)
[facility]Y DEPT Provider Note   CSN: [id number] Arrival date & time: 06/26/16  1134     History   Chief Complaint Chief Complaint  Patient presents with  . Abdominal Pain    HPI Loretta Alexander is a 36 y.o. female.  HPI    Loretta Alexander is a 36 y.o. female, with a history of SBO, appendectomy, cholecystectomy, right oophorectomy, adhesion takedown, gastric banding, and partial colonic resection presenting to the ED withPeriumbilical abdominal pain that began this morning, feels like a tightening and then release, radiates to the left abdomen, rates it 7/10. States it feels like previous SBO. Last BM was 1 week ago. Has been taking Senna and miralax for the last 4 days without relief.  Nausea and vomiting for the past 3 days. Also complains of subjective abdominal distension and firmness.   She also endorses a syncopal episode prior to arrival. Was sitting in class today, started sweating, became cool and clammy, and then lost consciousness. Pt fell to the floor. Regained consciousness once she hit the floor. Patient states she was told she did not hit her head. She endorses poor oral intake over the past week due to lack of appetite.   Denies fever/chills, diarrhea/watery stools, hematochezia/melena, neck/back pain, or any other complaints.   Past Medical History:  Diagnosis Date  . Complication of anesthesia    Difficult IV stick with Central line placement, TACHY 180-190 S/P COLON SURGERY (resolved with treatment for pain)   . Depression   . Drug-seeking behavior   . Dysrhythmia    INCREASED HR AT TIMES  . Eye infection    within last 2 weeks  . Gastritis 03/2011   Per EGD  . GERD (gastroesophageal reflux disease)    OCC TUMS  . Intestinal obstruction   . Migraines   . Obesity   . Ovarian cyst, right    Hx of oophorectomy for adhesions  . Pneumonia    04/2010  . Renal disorder    Kidney Stones  . Thyroid nodule   . UTI (lower urinary tract infection)      Patient Active Problem List   Diagnosis Date Noted  . Vaginal discharge 12/18/2015  . Bowel obstruction 12/17/2015  . Dysphagia 05/09/2015  . Neck swelling 05/08/2015  . S/P cervical spinal fusion 04/28/2015  . Migraine headache 01/20/2013  . Diplopia 01/20/2013  . Left-sided weakness 01/20/2013  . Right sided abdominal pain 01/11/2012  . Hyperglycemia 01/11/2012  . Drug-seeking behavior 01/11/2012  . Obesity 01/11/2012  . Constipation 01/11/2012  . Depression 08/31/2011  . Gastritis 04/06/2011  . Abdominal pain 04/03/2011  . Foreign body 04/03/2011  . Nausea with vomiting 04/03/2011    Past Surgical History:  Procedure Laterality Date  . ABDOMINAL ADHESION SURGERY  2006   BIL FALLOPIAN TUBES REMOVED  . ANTERIOR CERVICAL DECOMP/DISCECTOMY FUSION N/A 04/28/2015   Procedure: Cervical five-six, Cervical six - seven Anterior cervical decompression/diskectomy/fusion;  Surgeon: Tia Alert, MD;  Location: [facility];  Service: Neurosurgery;  Laterality: N/A;  C5-6 C6-7 Anterior cervical decompression/diskectomy/fusion  . APPENDECTOMY  1992  . CHOLECYSTECTOMY  2003  . COLON SURGERY     MAY 2015     BOWEL OBST s/p partial bowel resection [facility])  . ESOPHAGOGASTRODUODENOSCOPY  04/05/2011   Procedure: ESOPHAGOGASTRODUODENOSCOPY (EGD);  Surgeon: Charna Elizabeth, MD;  Location: [facility];  Service: Endoscopy;  Laterality: N/A;  . HERNIA REPAIR     umbilical  . KNEE ARTHROSCOPY  2001, 2010  left knee x 2  . LAPAROSCOPIC GASTRIC BANDING    . NASAL SEPTOPLASTY W/ TURBINOPLASTY  03/16/2011   Procedure: NASAL SEPTOPLASTY WITH TURBINATE REDUCTION;  Surgeon: Susy Frizzle, MD;  Location: [facility] OR;  Service: ENT;  Laterality: Bilateral;  . PILONIDAL CYST EXCISION    . removal of lap band    . right ovary removed  2006  . TONSILLECTOMY  1991    OB History    No data available       Home Medications    Prior to Admission medications   Medication Sig Start Date End Date  Taking? Authorizing Provider  acidophilus (RISAQUAD) CAPS capsule Take 1 capsule by mouth daily.   Yes Historical Provider, MD  DULoxetine (CYMBALTA) 60 MG capsule Take 60 mg by mouth daily.    Yes Historical Provider, MD  metFORMIN (GLUCOPHAGE-XR) 500 MG 24 hr tablet Take 500 mg by mouth daily with breakfast.   Yes Historical Provider, MD  ondansetron (ZOFRAN ODT) 4 MG disintegrating tablet Take 1 tablet (4 mg total) by mouth every 8 (eight) hours as needed for nausea or vomiting. 12/30/15  Yes Lyndal Pulley, MD  polyethylene glycol (MIRALAX / GLYCOLAX) packet Take 17 g by mouth daily.   Yes Historical Provider, MD  propranolol ER (INDERAL LA) 80 MG 24 hr capsule Take 1 capsule (80 mg total) by mouth daily. 06/16/16  Yes Adam Mliss Fritz, DO  QUEtiapine (SEROQUEL) 50 MG tablet Take 150 mg by mouth at bedtime.   Yes Historical Provider, MD  sennosides-docusate sodium (SENOKOT-S) 8.6-50 MG tablet Take 1 tablet by mouth daily.   Yes Historical Provider, MD  tiZANidine (ZANAFLEX) 4 MG tablet Take 4 mg by mouth every 6 (six) hours as needed for muscle spasms.   Yes Historical Provider, MD  traMADol-acetaminophen (ULTRACET) 37.5-325 MG tablet Take 1-2 tablets by mouth every 6 (six) hours as needed. 06/16/16  Yes Drema Dallas, DO  levonorgestrel (MIRENA) 20 MCG/24HR IUD 1 each by Intrauterine route once. 2010    Historical Provider, MD    Family History History reviewed. No pertinent family history.  Social History Social History  Substance Use Topics  . Smoking status: Never Smoker  . Smokeless tobacco: Never Used  . Alcohol use Yes     Comment: rarely     Allergies   Prochlorperazine maleate; Dilaudid [hydromorphone hcl]; Compazine [prochlorperazine maleate]; Haloperidol and related; Reglan [metoclopramide hcl]; and Toradol [ketorolac tromethamine]   Review of Systems Review of Systems  Constitutional: Negative for chills and fever.  Respiratory: Negative for shortness of breath.     Cardiovascular: Negative for chest pain.  Gastrointestinal: Positive for abdominal pain, constipation, nausea and vomiting. Negative for diarrhea.  Neurological: Positive for syncope.  All other systems reviewed and are negative.    Physical Exam Updated Vital Signs BP 111/70   Pulse 77   Temp 98.6 F (37 C) (Oral)   Resp 18   Ht 5\' 2"  (1.575 m)   Wt 99.8 kg   SpO2 100%   BMI 40.24 kg/m   Physical Exam  Constitutional: She appears well-developed and well-nourished. No distress.  HENT:  Head: Normocephalic and atraumatic.  Eyes: Conjunctivae are normal.  Neck: Neck supple.  Cardiovascular: Normal rate, regular rhythm, normal heart sounds and intact distal pulses.   Pulmonary/Chest: Effort normal and breath sounds normal. No respiratory distress.  Abdominal: Soft. She exhibits no distension. Bowel sounds are decreased. There is tenderness in the periumbilical area, left upper quadrant and left lower  quadrant. There is no guarding.  Musculoskeletal: She exhibits no edema.  Lymphadenopathy:    She has no cervical adenopathy.  Neurological: She is alert.  Skin: Skin is warm and dry. She is not diaphoretic.  Psychiatric: She has a normal mood and affect. Her behavior is normal.  Nursing note and vitals reviewed.    ED Treatments / Results  Labs (all labs ordered are listed, but only abnormal results are displayed) Labs Reviewed  COMPREHENSIVE METABOLIC PANEL - Abnormal; Notable for the following:       Result Value   Glucose, Bld 140 (*)    All other components within normal limits  CBC - Abnormal; Notable for the following:    WBC 10.6 (*)    Platelets 444 (*)    All other components within normal limits  URINALYSIS, ROUTINE W REFLEX MICROSCOPIC  I-STAT BETA HCG BLOOD, ED ([facility], [facility], [facility] ONLY)    EKG  EKG Interpretation None       Radiology No results found.  Procedures Procedures (including critical care time)  Medications Ordered in ED Medications   iopamidol (ISOVUE-300) 61 % injection (not administered)  sodium chloride 0.9 % bolus 1,000 mL (1,000 mLs Intravenous New Bag/Given 06/26/16 1412)    Followed by  sodium chloride 0.9 % bolus 1,000 mL (1,000 mLs Intravenous New Bag/Given 06/26/16 1412)  ondansetron (ZOFRAN) injection 4 mg (4 mg Intravenous Given 06/26/16 1412)  morphine 4 MG/ML injection 4 mg (4 mg Intravenous Given 06/26/16 1413)  morphine 4 MG/ML injection 4 mg (4 mg Intravenous Given 06/26/16 1508)     Initial Impression / Assessment and Plan / ED Course  I have reviewed the triage vital signs and the nursing notes.  Pertinent labs & imaging results that were available during my care of the patient were reviewed by me and considered in my medical decision making (see chart for details).      Patient presents with abdominal pain and constipation. Suspicion for SBO.    End of shift patient care handoff report given to Linwood DibblesJon Knapp, MD.  Plan: Pending abdominal CT. Evaluate for SBO or other abnormality. If negative, discharge with bowel regimen for constipation, including fiber and hydration. PCP vs GI follow up.     Vitals:   06/26/16 1201 06/26/16 1413 06/26/16 1510  BP: 111/70 125/75 117/64  Pulse: 77 71 77  Resp: 18 18 20   Temp: 98.6 F (37 C)    TempSrc: Oral    SpO2: 100% 100% 100%  Weight: 99.8 kg    Height: 5\' 2"  (1.575 m)       Final Clinical Impressions(s) / ED Diagnoses   Final diagnoses:  None    New Prescriptions New Prescriptions   No medications on file     Concepcion LivingShawn C Falon Flinchum, PA-C 06/26/16 1610    Marily MemosJason Mesner, MD 06/27/16 1437

## 2016-06-26 NOTE — Discharge Instructions (Signed)
Take the bentyl and zofran as needed for cramping and nausea and vomiting, follow up with your doctor to make sure you are improving. Monitor for fever, worsening symptoms

## 2016-06-26 NOTE — ED Provider Notes (Signed)
CT scan without acute findings.  Possible enteritis.    Reviewed labs with patient.  I do not see a lipase ordered but there is no inflammation noted on the CT scan around the pancrease.  I do not think the patient needs to be held in the ED longer at this time to order a lipase.   Linwood DibblesJon Rashel Okeefe, MD 06/26/16 [phone]

## 2016-06-26 NOTE — ED Notes (Signed)
Patient transported to CT 

## 2016-06-26 NOTE — ED Notes (Signed)
Two attempts made for IV access IVT consult placed

## 2016-07-04 MED FILL — tiZANidine HCL 4 MG TABS: 4 | 20 days supply | Qty: 60 | Fill #0

## 2016-07-04 MED FILL — DULoxetine HCL 60 MG CPEP: 60 | 30 days supply | Qty: 30 | Fill #3

## 2016-07-11 MED FILL — QUETIAPINE FUMARATE 50 MG T: 50 | 30 days supply | Qty: 90 | Fill #0

## 2016-07-25 MED FILL — METFORMIN HCL ER 500 MG TAB: 500 | 30 days supply | Qty: 30 | Fill #3

## 2016-07-25 MED FILL — tiZANidine HCL 4 MG TABS: 4 | 20 days supply | Qty: 60 | Fill #1

## 2016-08-12 DIAGNOSIS — K5900 Constipation, unspecified: Secondary | ICD-10-CM | POA: Diagnosis not present

## 2016-08-12 DIAGNOSIS — R51 Headache: Secondary | ICD-10-CM | POA: Diagnosis not present

## 2016-08-12 DIAGNOSIS — Z7984 Long term (current) use of oral hypoglycemic drugs: Secondary | ICD-10-CM | POA: Diagnosis not present

## 2016-08-12 DIAGNOSIS — R000 Tachycardia, unspecified: Secondary | ICD-10-CM | POA: Diagnosis not present

## 2016-08-12 DIAGNOSIS — R109 Unspecified abdominal pain: Secondary | ICD-10-CM | POA: Diagnosis not present

## 2016-08-12 DIAGNOSIS — R42 Dizziness and giddiness: Secondary | ICD-10-CM | POA: Diagnosis not present

## 2016-08-12 DIAGNOSIS — R112 Nausea with vomiting, unspecified: Secondary | ICD-10-CM | POA: Diagnosis not present

## 2016-08-12 DIAGNOSIS — Z6841 Body Mass Index (BMI) 40.0 and over, adult: Secondary | ICD-10-CM | POA: Diagnosis not present

## 2016-08-12 DIAGNOSIS — Z885 Allergy status to narcotic agent status: Secondary | ICD-10-CM | POA: Diagnosis not present

## 2016-08-12 DIAGNOSIS — R6883 Chills (without fever): Secondary | ICD-10-CM | POA: Diagnosis not present

## 2016-08-13 DIAGNOSIS — R112 Nausea with vomiting, unspecified: Secondary | ICD-10-CM | POA: Diagnosis not present

## 2016-08-13 DIAGNOSIS — R42 Dizziness and giddiness: Secondary | ICD-10-CM | POA: Diagnosis not present

## 2016-08-13 DIAGNOSIS — R51 Headache: Secondary | ICD-10-CM | POA: Diagnosis not present

## 2016-08-13 DIAGNOSIS — R6883 Chills (without fever): Secondary | ICD-10-CM | POA: Diagnosis not present

## 2016-08-13 DIAGNOSIS — R1111 Vomiting without nausea: Secondary | ICD-10-CM | POA: Diagnosis not present

## 2016-08-13 DIAGNOSIS — Z7984 Long term (current) use of oral hypoglycemic drugs: Secondary | ICD-10-CM | POA: Diagnosis not present

## 2016-08-13 DIAGNOSIS — Z885 Allergy status to narcotic agent status: Secondary | ICD-10-CM | POA: Diagnosis not present

## 2016-08-13 DIAGNOSIS — R109 Unspecified abdominal pain: Secondary | ICD-10-CM | POA: Diagnosis not present

## 2016-08-13 DIAGNOSIS — K5900 Constipation, unspecified: Secondary | ICD-10-CM | POA: Diagnosis not present

## 2016-08-15 DIAGNOSIS — G43909 Migraine, unspecified, not intractable, without status migrainosus: Secondary | ICD-10-CM | POA: Diagnosis not present

## 2016-08-15 DIAGNOSIS — G43A0 Cyclical vomiting, not intractable: Secondary | ICD-10-CM | POA: Diagnosis not present

## 2016-08-15 DIAGNOSIS — R000 Tachycardia, unspecified: Secondary | ICD-10-CM | POA: Diagnosis not present

## 2016-08-15 DIAGNOSIS — R1084 Generalized abdominal pain: Secondary | ICD-10-CM | POA: Diagnosis not present

## 2016-08-15 DIAGNOSIS — E860 Dehydration: Secondary | ICD-10-CM | POA: Diagnosis not present

## 2016-08-15 DIAGNOSIS — R112 Nausea with vomiting, unspecified: Secondary | ICD-10-CM | POA: Diagnosis not present

## 2016-08-15 DIAGNOSIS — E876 Hypokalemia: Secondary | ICD-10-CM | POA: Diagnosis not present

## 2016-08-15 DIAGNOSIS — R7303 Prediabetes: Secondary | ICD-10-CM | POA: Diagnosis not present

## 2016-08-15 DIAGNOSIS — Z452 Encounter for adjustment and management of vascular access device: Secondary | ICD-10-CM | POA: Diagnosis not present

## 2016-08-15 DIAGNOSIS — F329 Major depressive disorder, single episode, unspecified: Secondary | ICD-10-CM | POA: Diagnosis not present

## 2016-08-15 DIAGNOSIS — R109 Unspecified abdominal pain: Secondary | ICD-10-CM | POA: Diagnosis not present

## 2016-08-16 DIAGNOSIS — F329 Major depressive disorder, single episode, unspecified: Secondary | ICD-10-CM | POA: Diagnosis not present

## 2016-08-16 DIAGNOSIS — G43909 Migraine, unspecified, not intractable, without status migrainosus: Secondary | ICD-10-CM | POA: Diagnosis not present

## 2016-08-16 DIAGNOSIS — G43A0 Cyclical vomiting, not intractable: Secondary | ICD-10-CM | POA: Diagnosis not present

## 2016-08-16 DIAGNOSIS — E876 Hypokalemia: Secondary | ICD-10-CM | POA: Diagnosis not present

## 2016-08-16 DIAGNOSIS — R109 Unspecified abdominal pain: Secondary | ICD-10-CM | POA: Diagnosis not present

## 2016-08-16 DIAGNOSIS — R000 Tachycardia, unspecified: Secondary | ICD-10-CM | POA: Diagnosis not present

## 2016-08-16 DIAGNOSIS — R1084 Generalized abdominal pain: Secondary | ICD-10-CM | POA: Diagnosis not present

## 2016-08-16 DIAGNOSIS — E860 Dehydration: Secondary | ICD-10-CM | POA: Diagnosis not present

## 2016-08-16 DIAGNOSIS — R7303 Prediabetes: Secondary | ICD-10-CM | POA: Diagnosis not present

## 2016-08-17 DIAGNOSIS — G43A0 Cyclical vomiting, not intractable: Secondary | ICD-10-CM | POA: Diagnosis not present

## 2016-08-17 DIAGNOSIS — R109 Unspecified abdominal pain: Secondary | ICD-10-CM | POA: Diagnosis not present

## 2016-08-17 DIAGNOSIS — R1084 Generalized abdominal pain: Secondary | ICD-10-CM | POA: Diagnosis not present

## 2016-08-17 DIAGNOSIS — R000 Tachycardia, unspecified: Secondary | ICD-10-CM | POA: Diagnosis not present

## 2016-08-17 DIAGNOSIS — E876 Hypokalemia: Secondary | ICD-10-CM | POA: Diagnosis not present

## 2016-08-17 DIAGNOSIS — G43909 Migraine, unspecified, not intractable, without status migrainosus: Secondary | ICD-10-CM | POA: Diagnosis not present

## 2016-08-17 DIAGNOSIS — E860 Dehydration: Secondary | ICD-10-CM | POA: Diagnosis not present

## 2016-08-17 DIAGNOSIS — R7303 Prediabetes: Secondary | ICD-10-CM | POA: Diagnosis not present

## 2016-08-17 DIAGNOSIS — F329 Major depressive disorder, single episode, unspecified: Secondary | ICD-10-CM | POA: Diagnosis not present

## 2016-08-17 MED FILL — QUETIAPINE FUMARATE 50 MG T: 50 | 30 days supply | Qty: 90 | Fill #1

## 2016-08-17 MED FILL — DULoxetine HCL 60 MG CPEP: 60 | 30 days supply | Qty: 30 | Fill #4

## 2016-08-18 MED FILL — PROPRANOLOL ER 80 MG CAP: 80 | 30 days supply | Qty: 30 | Fill #0

## 2016-08-22 MED FILL — tiZANidine HCL 4 MG TABS: 4 | 20 days supply | Qty: 60 | Fill #0

## 2016-08-30 ENCOUNTER — Telehealth: Payer: Self-pay | Admitting: Neurology

## 2016-08-30 NOTE — Telephone Encounter (Signed)
Left message for patient to call back regarding her headaches.

## 2016-08-30 NOTE — Telephone Encounter (Signed)
Patient needs to talk to someone about her headache they are getting worst and not getting better with the medication

## 2016-08-30 NOTE — Telephone Encounter (Signed)
PT returned your call/Dawn °

## 2016-08-31 DIAGNOSIS — N3091 Cystitis, unspecified with hematuria: Secondary | ICD-10-CM | POA: Diagnosis not present

## 2016-08-31 DIAGNOSIS — N23 Unspecified renal colic: Secondary | ICD-10-CM | POA: Diagnosis not present

## 2016-08-31 DIAGNOSIS — H5213 Myopia, bilateral: Secondary | ICD-10-CM | POA: Diagnosis not present

## 2016-08-31 DIAGNOSIS — R7303 Prediabetes: Secondary | ICD-10-CM | POA: Diagnosis not present

## 2016-09-01 NOTE — Telephone Encounter (Signed)
Unable to leave message for patient to call back about her headache issue.

## 2016-09-03 ENCOUNTER — Encounter: Payer: Self-pay | Admitting: Neurology

## 2016-09-04 ENCOUNTER — Other Ambulatory Visit: Payer: Self-pay | Admitting: *Deleted

## 2016-09-04 DIAGNOSIS — G43611 Persistent migraine aura with cerebral infarction, intractable, with status migrainosus: Secondary | ICD-10-CM

## 2016-09-04 DIAGNOSIS — I639 Cerebral infarction, unspecified: Principal | ICD-10-CM

## 2016-09-04 MED ORDER — KETOPROFEN 75 MG PO CAPS: 75.0000 mg | ORAL_CAPSULE | Freq: Three times a day (TID) | ORAL | 1 refills | Status: DC | PRN

## 2016-09-04 MED ORDER — PROCHLORPERAZINE MALEATE 10 MG PO TABS: 10.0000 mg | ORAL_TABLET | Freq: Three times a day (TID) | ORAL | 1 refills | Status: DC | PRN

## 2016-09-05 ENCOUNTER — Telehealth: Payer: Self-pay | Admitting: Neurology

## 2016-09-05 MED ORDER — CHLORPROMAZINE HCL 10 MG PO TABS: 10.0000 mg | ORAL_TABLET | Freq: Three times a day (TID) | ORAL | 0 refills | Status: DC

## 2016-09-05 NOTE — Telephone Encounter (Signed)
I don't prescribe her those medications (imipramine and tizanidine).  As couple of days ago, I recommended increasing propranolol.  I advised to stop promethazine and instead take ketoprofen and Compazine for acute migraine attacks.

## 2016-09-05 NOTE — Telephone Encounter (Signed)
I see that she is allergic to Compazine.  Instead of Compazine, we can try chlorpromazine  along with the ketoprofen.

## 2016-09-05 NOTE — Telephone Encounter (Signed)
Chlorpromazine rx sent to pharmacy.

## 2016-09-05 NOTE — Telephone Encounter (Signed)
Received a call from [facility] regarding medication: 2 prescriptions that they received for PT that Dr Everlena Cooper prescribed they say she has allergies to both. CB# [phone]   Patient needs a refill of medication.   Patient having side effects from medication.   Patient calling to update [place] on medication.

## 2016-09-05 NOTE — Telephone Encounter (Signed)
Patient has allergic interation to both of these medications Tizanidine and imipramine, please advise as to what to do next.

## 2016-09-06 ENCOUNTER — Telehealth: Payer: Self-pay

## 2016-09-06 ENCOUNTER — Telehealth: Payer: Self-pay | Admitting: *Deleted

## 2016-09-06 ENCOUNTER — Other Ambulatory Visit: Payer: Self-pay

## 2016-09-06 DIAGNOSIS — G43809 Other migraine, not intractable, without status migrainosus: Secondary | ICD-10-CM

## 2016-09-06 NOTE — Telephone Encounter (Signed)
Opened in error

## 2016-09-06 NOTE — Telephone Encounter (Signed)
Helmut Muster with [place] [facility] has called to verify medications.  Per patient chart she cannot tolerate compazine or ketoprofen.  Pharmacy has tried to reach patient to see if she would still like to try these medications.  Per Dr Everlena Cooper these are the only options he can offer or patient can be referred to headache specialist.  Mychart message sent to patient.  Pharmacy has also tried to contact patient with no response.

## 2016-09-08 MED FILL — METFORMIN HCL ER 500 MG TAB: 500 | 30 days supply | Qty: 30 | Fill #0

## 2016-09-11 MED FILL — DULoxetine HCL 60 MG CPEP: 60 | 30 days supply | Qty: 30 | Fill #5

## 2016-09-11 MED FILL — QUETIAPINE FUMARATE 50 MG T: 50 | 30 days supply | Qty: 90 | Fill #2

## 2016-09-11 MED FILL — tiZANidine HCL 4 MG TABS: 4 | 20 days supply | Qty: 60 | Fill #1

## 2016-09-12 ENCOUNTER — Ambulatory Visit: Payer: 59 | Admitting: Neurology

## 2016-09-28 ENCOUNTER — Ambulatory Visit: Payer: 59 | Admitting: Neurology

## 2016-10-02 ENCOUNTER — Encounter (HOSPITAL_COMMUNITY): Payer: Self-pay | Admitting: Emergency Medicine

## 2016-10-02 ENCOUNTER — Emergency Department (HOSPITAL_COMMUNITY): Payer: 59

## 2016-10-02 ENCOUNTER — Other Ambulatory Visit: Payer: Self-pay

## 2016-10-02 ENCOUNTER — Emergency Department (HOSPITAL_COMMUNITY)
Admission: EM | Admit: 2016-10-02 | Discharge: 2016-10-03 | Disposition: A | Discharge status: Home / Self Care | Payer: 59 | Attending: Emergency Medicine | Admitting: Emergency Medicine

## 2016-10-02 DIAGNOSIS — R0789 Other chest pain: Secondary | ICD-10-CM | POA: Diagnosis not present

## 2016-10-02 DIAGNOSIS — R079 Chest pain, unspecified: Secondary | ICD-10-CM | POA: Diagnosis not present

## 2016-10-02 DIAGNOSIS — Z79899 Other long term (current) drug therapy: Secondary | ICD-10-CM | POA: Diagnosis not present

## 2016-10-02 DIAGNOSIS — Z7984 Long term (current) use of oral hypoglycemic drugs: Secondary | ICD-10-CM | POA: Insufficient documentation

## 2016-10-02 LAB — BASIC METABOLIC PANEL
ANION GAP: 13 (ref 5–15)
BUN: 11 mg/dL (ref 6–20)
CHLORIDE: 102 mmol/L (ref 101–111)
CO2: 18 mmol/L — AB (ref 22–32)
Calcium: 9.3 mg/dL (ref 8.9–10.3)
Creatinine, Ser: 0.79 mg/dL (ref 0.44–1.00)
GFR calc Af Amer: >60 mL/min (ref >60)
GFR calc non Af Amer: >60 mL/min (ref >60)
GLUCOSE: 308 mg/dL — AB (ref 65–99)
POTASSIUM: 3.8 mmol/L (ref 3.5–5.1)
Sodium: 133 mmol/L — ABNORMAL LOW (ref 135–145)

## 2016-10-02 LAB — I-STAT TROPONIN, ED
TROPONIN I, POC: 0 ng/mL (ref 0.00–0.08)
Troponin i, poc: 0 ng/mL (ref 0.00–0.08)

## 2016-10-02 LAB — CBC
HEMATOCRIT: 39.6 % (ref 36.0–46.0)
HEMOGLOBIN: 13.8 g/dL (ref 12.0–15.0)
MCH: 31.5 pg (ref 26.0–34.0)
MCHC: 34.8 g/dL (ref 30.0–36.0)
MCV: 90.4 fL (ref 78.0–100.0)
Platelets: 366 10*3/uL (ref 150–400)
RBC: 4.38 MIL/uL (ref 3.87–5.11)
RDW: 13 % (ref 11.5–15.5)
WBC: 9.6 10*3/uL (ref 4.0–10.5)

## 2016-10-02 LAB — D-DIMER, QUANTITATIVE: D-Dimer, Quant: 0.51 ug/mL-FEU — ABNORMAL HIGH (ref 0.00–0.50)

## 2016-10-02 MED ORDER — IBUPROFEN 200 MG PO TABS
600.0000 mg | ORAL_TABLET | Freq: Once | ORAL | Status: AC
  Administered 2016-10-02: 600 mg via ORAL
  Filled 2016-10-02: qty 1

## 2016-10-02 MED ORDER — ACETAMINOPHEN 500 MG PO TABS
1000.0000 mg | ORAL_TABLET | Freq: Once | ORAL | Status: AC
  Administered 2016-10-02: 1000 mg via ORAL
  Filled 2016-10-02: qty 2

## 2016-10-02 MED ORDER — IOPAMIDOL (ISOVUE-370) INJECTION 76%
INTRAVENOUS | Status: AC
  Administered 2016-10-03: 100 mL
  Filled 2016-10-02: qty 100

## 2016-10-02 MED ORDER — ONDANSETRON HCL 4 MG PO TABS
4.0000 mg | ORAL_TABLET | Freq: Once | ORAL | Status: AC
  Administered 2016-10-02: 4 mg via ORAL
  Filled 2016-10-02: qty 1

## 2016-10-02 NOTE — ED Provider Notes (Signed)
[facility]Y DEPT Provider Note   CSN: [id number] Arrival date & time: 10/02/16  2010     History   Chief Complaint Chief Complaint  Patient presents with  . Chest Pain    HPI Loretta Alexander is a 36 y.o. female.  Patient reports over the last 4 days, she has had intermittent left-sided chest pain. Pressure nature. Exertional. Endorses associated shortness of breath, nausea, diaphoresis. His been getting progressively worse over the last 4 days, so presented here. She has no history of diabetes, cholesterol, hypertension. No history of cigarette use. She does have a strong family history of heart attack. She denies fevers or chills. No infectious symptoms.   The history is provided by the patient.  Illness  This is a new problem. Episode onset: 4 days. The problem occurs constantly. The problem has not changed since onset.Associated symptoms include chest pain and shortness of breath. Pertinent negatives include no abdominal pain and no headaches. She has tried nothing for the symptoms.    Past Medical History:  Diagnosis Date  . Complication of anesthesia    Difficult IV stick with Central line placement, TACHY 180-190 S/P COLON SURGERY (resolved with treatment for pain)   . Depression   . Drug-seeking behavior   . Dysrhythmia    INCREASED HR AT TIMES  . Eye infection    within last 2 weeks  . Gastritis 03/2011   Per EGD  . GERD (gastroesophageal reflux disease)    OCC TUMS  . Intestinal obstruction (HCC)   . Migraines   . Obesity   . Ovarian cyst, right    Hx of oophorectomy for adhesions  . Pneumonia    04/2010  . Renal disorder    Kidney Stones  . Thyroid nodule   . UTI (lower urinary tract infection)     Patient Active Problem List   Diagnosis Date Noted  . Vaginal discharge 12/18/2015  . Bowel obstruction (HCC) 12/17/2015  . Dysphagia 05/09/2015  . Neck swelling 05/08/2015  . S/P cervical spinal fusion 04/28/2015  . Migraine headache 01/20/2013  .  Diplopia 01/20/2013  . Left-sided weakness 01/20/2013  . Right sided abdominal pain 01/11/2012  . Hyperglycemia 01/11/2012  . Drug-seeking behavior 01/11/2012  . Obesity 01/11/2012  . Constipation 01/11/2012  . Depression 08/31/2011  . Gastritis 04/06/2011  . Abdominal pain 04/03/2011  . Foreign body 04/03/2011  . Nausea with vomiting 04/03/2011    Past Surgical History:  Procedure Laterality Date  . ABDOMINAL ADHESION SURGERY  2006   BIL FALLOPIAN TUBES REMOVED  . ANTERIOR CERVICAL DECOMP/DISCECTOMY FUSION N/A 04/28/2015   Procedure: Cervical five-six, Cervical six - seven Anterior cervical decompression/diskectomy/fusion;  Surgeon: Tia Alert, MD;  Location: [facility] NEURO ORS;  Service: Neurosurgery;  Laterality: N/A;  C5-6 C6-7 Anterior cervical decompression/diskectomy/fusion  . APPENDECTOMY  1992  . CHOLECYSTECTOMY  2003  . COLON SURGERY     MAY 2015     BOWEL OBST s/p partial bowel resection [facility])  . ESOPHAGOGASTRODUODENOSCOPY  04/05/2011   Procedure: ESOPHAGOGASTRODUODENOSCOPY (EGD);  Surgeon: Charna Elizabeth, MD;  Location: [facility] ENDOSCOPY;  Service: Endoscopy;  Laterality: N/A;  . HERNIA REPAIR     umbilical  . KNEE ARTHROSCOPY  2001, 2010   left knee x 2  . LAPAROSCOPIC GASTRIC BANDING    . NASAL SEPTOPLASTY W/ TURBINOPLASTY  03/16/2011   Procedure: NASAL SEPTOPLASTY WITH TURBINATE REDUCTION;  Surgeon: Susy Frizzle, MD;  Location: [facility] OR;  Service: ENT;  Laterality: Bilateral;  . PILONIDAL  CYST EXCISION    . removal of lap band    . right ovary removed  2006  . TONSILLECTOMY  1991    OB History    No data available       Home Medications    Prior to Admission medications   Medication Sig Start Date End Date Taking? Authorizing Provider  acidophilus (RISAQUAD) CAPS capsule Take 1 capsule by mouth daily.    [provider]  chlorproMAZINE (THORAZINE) 10 MG tablet Take 1 tablet (10 mg total) by mouth 3 (three) times daily. 09/05/16   Drema DallasJaffe, Adam R, DO    dicyclomine (BENTYL) 20 MG tablet Take 1 tablet (20 mg total) by mouth 2 (two) times daily. 06/26/16   Linwood DibblesKnapp, Jon, MD  DULoxetine (CYMBALTA) 60 MG capsule Take 60 mg by mouth daily.     [provider]  ketoprofen (ORUDIS) 75 MG capsule Take 1 capsule (75 mg total) by mouth 3 (three) times daily as needed. 09/04/16   Drema DallasJaffe, Adam R, DO  levonorgestrel (MIRENA) 20 MCG/24HR IUD 1 each by Intrauterine route once. 2010    [provider]  metFORMIN (GLUCOPHAGE-XR) 500 MG 24 hr tablet Take 500 mg by mouth daily with breakfast.    [provider]  ondansetron (ZOFRAN ODT) 8 MG disintegrating tablet Take 1 tablet (8 mg total) by mouth every 8 (eight) hours as needed for nausea or vomiting. 06/26/16   Linwood DibblesKnapp, Jon, MD  polyethylene glycol [facility](MIRALAX / Ethelene HalGLYCOLAX) packet Take 17 g by mouth daily.    [provider]  prochlorperazine (COMPAZINE) 10 MG tablet Take 1 tablet (10 mg total) by mouth 3 (three) times daily as needed for nausea or vomiting. 09/04/16   Drema DallasJaffe, Adam R, DO  propranolol ER (INDERAL LA) 80 MG 24 hr capsule Take 1 capsule (80 mg total) by mouth daily. 06/16/16   Everlena CooperJaffe, Adam R, DO  QUEtiapine (SEROQUEL) 50 MG tablet Take 150 mg by mouth at bedtime.    [provider]  sennosides-docusate sodium (SENOKOT-S) 8.6-50 MG tablet Take 1 tablet by mouth daily.    [provider]  tiZANidine (ZANAFLEX) 4 MG tablet Take 4 mg by mouth every 6 (six) hours as needed for muscle spasms.    [provider]  traMADol-acetaminophen (ULTRACET) 37.5-325 MG tablet Take 1-2 tablets by mouth every 6 (six) hours as needed. 06/16/16   Drema DallasJaffe, Adam R, DO    Family History No family history on file.  Social History Social History  Substance Use Topics  . Smoking status: Never Smoker  . Smokeless tobacco: Never Used  . Alcohol use Yes     Comment: rarely     Allergies   Prochlorperazine maleate; Dilaudid [hydromorphone hcl]; Compazine [prochlorperazine  maleate]; Haloperidol and related; Reglan [metoclopramide hcl]; and Toradol [ketorolac tromethamine]   Review of Systems Review of Systems  Constitutional: Negative for chills and fever.  Respiratory: Positive for shortness of breath. Negative for cough.   Cardiovascular: Positive for chest pain. Negative for palpitations and leg swelling.  Gastrointestinal: Negative for abdominal pain, diarrhea, nausea and vomiting.  Musculoskeletal: Negative for back pain.  Neurological: Negative for light-headedness and headaches.     Physical Exam Updated Vital Signs BP 123/60   Pulse 65   Temp 98.4 F (36.9 C) (Oral)   Resp 16   Ht 5\' 2"  (1.575 m)   Wt 97.5 kg (215 lb)   SpO2 100%   BMI 39.32 kg/m   Physical Exam  Constitutional: She appears well-developed  and well-nourished. No distress.  HENT:  Head: Normocephalic and atraumatic.  Eyes: Conjunctivae are normal.  Neck: Neck supple.  Cardiovascular: Normal rate and regular rhythm.   No murmur heard. Pulmonary/Chest: Effort normal and breath sounds normal. No respiratory distress. She has no wheezes. She has no rhonchi.  Abdominal: Soft. There is no tenderness.  Musculoskeletal: She exhibits no edema.       Right lower leg: She exhibits no swelling and no edema.       Left lower leg: She exhibits no swelling and no edema.  Neurological: She is alert.  Skin: Skin is warm and dry.  Psychiatric: She has a normal mood and affect.  Nursing note and vitals reviewed.    ED Treatments / Results  Labs (all labs ordered are listed, but only abnormal results are displayed) Labs Reviewed  BASIC METABOLIC PANEL - Abnormal; Notable for the following:       Result Value   Sodium 133 (*)    CO2 18 (*)    Glucose, Bld 308 (*)    All other components within normal limits  D-DIMER, QUANTITATIVE (NOT AT [facility]) - Abnormal; Notable for the following:    D-Dimer, Quant 0.51 (*)    All other components within normal limits  CBC  I-STAT  TROPOININ, ED  I-STAT TROPOININ, ED  Rosezena Sensor, ED    EKG  EKG Interpretation  Date/Time:  Monday Oct 02 2016 20:18:00 EDT Ventricular Rate:  79 PR Interval:  144 QRS Duration: 90 QT Interval:  370 QTC Calculation: 424 R Axis:   -10 Text Interpretation:  Normal sinus rhythm Possible Anterior infarct , age undetermined Abnormal ECG No significant change was found Confirmed by Azalia Bilis ([id number]) on 10/02/2016 11:15:26 PM       Radiology Dg Chest 2 View  Result Date: 10/02/2016 CLINICAL DATA:  Midsternal chest pain. EXAM: CHEST  2 VIEW COMPARISON:  April 10, 2016 FINDINGS: The heart size and mediastinal contours are within normal limits. Both lungs are clear. The visualized skeletal structures are unremarkable. IMPRESSION: No active cardiopulmonary disease. Electronically Signed   By: Gerome Sam III M.D   On: 10/02/2016 21:01   Ct Angio Chest Pe W Or Wo Contrast  Result Date: 10/03/2016 CLINICAL DATA:  Acute onset of shortness of breath and generalized chest pain. Initial encounter. EXAM: CT ANGIOGRAPHY CHEST WITH CONTRAST TECHNIQUE: Multidetector CT imaging of the chest was performed using the standard protocol during bolus administration of intravenous contrast. Multiplanar CT image reconstructions and MIPs were obtained to evaluate the vascular anatomy. CONTRAST:  100 mL of Isovue 370 IV contrast COMPARISON:  Chest radiograph performed 10/02/2016, and CTA of the chest performed 01/03/2006 FINDINGS: Cardiovascular:  There is no evidence of pulmonary embolus. The heart is normal in size. The thoracic aorta is unremarkable in appearance. No calcific atherosclerotic disease is seen. The great vessels are grossly unremarkable in appearance. Mediastinum/Nodes: No mediastinal lymphadenopathy is seen. Trace pericardial fluid remains within normal limits. The thyroid gland is unremarkable. No axillary lymphadenopathy is appreciated. Lungs/Pleura: Minimal bibasilar atelectasis is  noted. No pleural effusion or pneumothorax is seen. No masses are identified. Upper Abdomen: The visualized portions of the liver and spleen are grossly unremarkable. The patient is status post cholecystectomy, with clips noted at the gallbladder fossa. The visualized portions of the pancreas adrenal glands are within normal limits. Musculoskeletal: No acute osseous abnormalities are identified. Cervical spinal fusion hardware is partially imaged. The visualized musculature is unremarkable in appearance. Review of the  MIP images confirms the above findings. IMPRESSION: 1. No evidence of pulmonary embolus. 2. Minimal bibasilar atelectasis noted.  Lungs otherwise clear. Electronically Signed   By: Roanna Raider M.D.   On: 10/03/2016 00:57    Procedures Procedures (including critical care time)  Medications Ordered in ED Medications  oxyCODONE-acetaminophen (PERCOCET/ROXICET) 5-325 MG per tablet 1 tablet (not administered)  acetaminophen (TYLENOL) tablet 1,000 mg (1,000 mg Oral Given 10/02/16 2213)  ibuprofen (ADVIL,MOTRIN) tablet 600 mg (600 mg Oral Given 10/02/16 2213)  ondansetron (ZOFRAN) tablet 4 mg (4 mg Oral Given 10/02/16 2213)  iopamidol (ISOVUE-370) 76 % injection (100 mLs  Contrast Given 10/03/16 0019)  diphenhydrAMINE (BENADRYL) 50 MG/ML injection (25 mg  Given 10/03/16 0035)     Initial Impression / Assessment and Plan / ED Course  I have reviewed the triage vital signs and the nursing notes.  Pertinent labs & imaging results that were available during my care of the patient were reviewed by me and considered in my medical decision making (see chart for details).     EKG here without ischemic changes or interval abnormalities. She is a low heart score. Delta troponin negative. Doubt ACS. Chest x-ray with evidence of pneumothorax or pneumonia. Narrow mediastinum. Description of symptoms not consistent with dissection. Patient did have a d-dimer ordered. Mildly elevated. CT pulmonary  embolism study neg.  . Doubt PE. Scan also did not show any evidence of acute pathology such as dissection or pneumonia.  Unclear of the patient's cause of her symptoms. The fact that she does have a history of seasonal allergies and she is reporting a sensation of having a difficult time taking in deep breath, possible this could be allergy related. She also has some component of chest wall pain. Gave her some pain medications here. Encouraged her to continue to take her at home allergy medications. Gave her a prescription for albuterol. Told to take this every 2 hours scheduled for the next few days. Told her to follow up with her cardiologist within the next few days for reevaluation and possibly have another stress test.  Final diagnoses:  Chest pain    New Prescriptions New Prescriptions   No medications on file     Lindalou Hose, MD 10/03/16 1610    Azalia Bilis, MD 10/03/16 2330

## 2016-10-02 NOTE — ED Triage Notes (Signed)
Reports having central cp that radiates to left chest and down left arm.  Started 2-3 days ago.  Reports that she had severed left leg pain 4 days ago.  Traveled oot over the weekend.  C/o sob, dizziness, diaphoresis, ans nausea.

## 2016-10-03 ENCOUNTER — Emergency Department (HOSPITAL_COMMUNITY): Payer: 59

## 2016-10-03 DIAGNOSIS — Z79899 Other long term (current) drug therapy: Secondary | ICD-10-CM | POA: Diagnosis not present

## 2016-10-03 DIAGNOSIS — R0602 Shortness of breath: Secondary | ICD-10-CM | POA: Diagnosis not present

## 2016-10-03 DIAGNOSIS — Z7984 Long term (current) use of oral hypoglycemic drugs: Secondary | ICD-10-CM | POA: Diagnosis not present

## 2016-10-03 DIAGNOSIS — R0789 Other chest pain: Secondary | ICD-10-CM | POA: Diagnosis not present

## 2016-10-03 MED ORDER — ALBUTEROL SULFATE HFA 108 (90 BASE) MCG/ACT IN AERS: 1.0000 | INHALATION_SPRAY | Freq: Four times a day (QID) | RESPIRATORY_TRACT | 0 refills | Status: DC | PRN

## 2016-10-03 MED ORDER — OXYCODONE-ACETAMINOPHEN 5-325 MG PO TABS
1.0000 | ORAL_TABLET | Freq: Once | ORAL | Status: AC
  Administered 2016-10-03: 1 via ORAL
  Filled 2016-10-03: qty 1

## 2016-10-03 MED ORDER — DIPHENHYDRAMINE HCL 50 MG/ML IJ SOLN
INTRAMUSCULAR | Status: AC
  Administered 2016-10-03: 25 mg
  Filled 2016-10-03: qty 1

## 2016-10-03 NOTE — ED Notes (Signed)
Patient returned from CT complaining of itching all over, some shortness of breath.  MD notified.  New orders per Dr Carolan Clines'Rourke.

## 2016-10-03 NOTE — ED Notes (Signed)
Patient transported to CT 

## 2016-10-11 MED FILL — QUETIAPINE FUMARATE 50 MG T: 50 | 30 days supply | Qty: 90 | Fill #3

## 2016-10-11 MED FILL — DULoxetine HCL 60 MG CPEP: 60 | 30 days supply | Qty: 30 | Fill #0

## 2016-10-11 MED FILL — METFORMIN HCL ER 500 MG TAB: 500 | 30 days supply | Qty: 30 | Fill #1

## 2016-10-12 MED FILL — tiZANidine HCL 4 MG TABS: 4 | 20 days supply | Qty: 60 | Fill #0

## 2016-10-27 DIAGNOSIS — R112 Nausea with vomiting, unspecified: Secondary | ICD-10-CM | POA: Diagnosis not present

## 2016-10-27 DIAGNOSIS — R1033 Periumbilical pain: Secondary | ICD-10-CM | POA: Diagnosis not present

## 2016-10-27 DIAGNOSIS — K5900 Constipation, unspecified: Secondary | ICD-10-CM | POA: Diagnosis not present

## 2016-10-27 DIAGNOSIS — E860 Dehydration: Secondary | ICD-10-CM | POA: Diagnosis not present

## 2016-10-27 DIAGNOSIS — R197 Diarrhea, unspecified: Secondary | ICD-10-CM | POA: Diagnosis not present

## 2016-10-27 DIAGNOSIS — R1110 Vomiting, unspecified: Secondary | ICD-10-CM | POA: Diagnosis not present

## 2016-10-28 DIAGNOSIS — E860 Dehydration: Secondary | ICD-10-CM | POA: Diagnosis not present

## 2016-10-28 DIAGNOSIS — R1033 Periumbilical pain: Secondary | ICD-10-CM | POA: Diagnosis not present

## 2016-10-28 DIAGNOSIS — R112 Nausea with vomiting, unspecified: Secondary | ICD-10-CM | POA: Diagnosis not present

## 2016-11-10 MED FILL — DULoxetine HCL 60 MG CPEP: 60 | 30 days supply | Qty: 30 | Fill #0

## 2016-11-10 MED FILL — tiZANidine HCL 4 MG TABS: 4 | 20 days supply | Qty: 60 | Fill #1

## 2016-11-10 MED FILL — QUETIAPINE FUMARATE 50 MG T: 50 | 30 days supply | Qty: 90 | Fill #0

## 2016-11-10 MED FILL — METFORMIN HCL ER 500 MG TAB: 500 | 30 days supply | Qty: 30 | Fill #0

## 2016-12-07 DIAGNOSIS — M25532 Pain in left wrist: Secondary | ICD-10-CM | POA: Diagnosis not present

## 2016-12-07 DIAGNOSIS — M654 Radial styloid tenosynovitis [de Quervain]: Secondary | ICD-10-CM | POA: Diagnosis not present

## 2016-12-08 MED FILL — tiZANidine HCL 4 MG TABS: 4 | 20 days supply | Qty: 60 | Fill #0

## 2016-12-08 MED FILL — PANTOPRAZOLE SOD DR 40 MG T: 40 | 30 days supply | Qty: 30 | Fill #0

## 2016-12-18 DIAGNOSIS — R000 Tachycardia, unspecified: Secondary | ICD-10-CM | POA: Diagnosis not present

## 2016-12-18 DIAGNOSIS — R112 Nausea with vomiting, unspecified: Secondary | ICD-10-CM | POA: Diagnosis not present

## 2016-12-18 DIAGNOSIS — Z7984 Long term (current) use of oral hypoglycemic drugs: Secondary | ICD-10-CM | POA: Diagnosis not present

## 2016-12-18 DIAGNOSIS — G43909 Migraine, unspecified, not intractable, without status migrainosus: Secondary | ICD-10-CM | POA: Diagnosis not present

## 2016-12-18 DIAGNOSIS — H53149 Visual discomfort, unspecified: Secondary | ICD-10-CM | POA: Diagnosis not present

## 2016-12-18 DIAGNOSIS — F329 Major depressive disorder, single episode, unspecified: Secondary | ICD-10-CM | POA: Diagnosis not present

## 2016-12-18 DIAGNOSIS — E1165 Type 2 diabetes mellitus with hyperglycemia: Secondary | ICD-10-CM | POA: Diagnosis not present

## 2016-12-18 DIAGNOSIS — Z6841 Body Mass Index (BMI) 40.0 and over, adult: Secondary | ICD-10-CM | POA: Diagnosis not present

## 2016-12-18 DIAGNOSIS — R079 Chest pain, unspecified: Secondary | ICD-10-CM | POA: Diagnosis not present

## 2016-12-18 DIAGNOSIS — R200 Anesthesia of skin: Secondary | ICD-10-CM | POA: Diagnosis not present

## 2016-12-18 DIAGNOSIS — R739 Hyperglycemia, unspecified: Secondary | ICD-10-CM | POA: Diagnosis not present

## 2016-12-18 DIAGNOSIS — R4781 Slurred speech: Secondary | ICD-10-CM | POA: Diagnosis not present

## 2016-12-18 DIAGNOSIS — R51 Headache: Secondary | ICD-10-CM | POA: Diagnosis not present

## 2016-12-29 MED FILL — tiZANidine HCL 4 MG TABS: 4 | 20 days supply | Qty: 60 | Fill #1

## 2017-01-02 ENCOUNTER — Observation Stay (HOSPITAL_COMMUNITY)
Admission: EM | Admit: 2017-01-02 | Discharge: 2017-01-03 | Disposition: A | Discharge status: Home / Self Care | Payer: 59 | Attending: Family Medicine | Admitting: Family Medicine

## 2017-01-02 ENCOUNTER — Encounter (HOSPITAL_COMMUNITY): Payer: Self-pay | Admitting: Emergency Medicine

## 2017-01-02 DIAGNOSIS — R202 Paresthesia of skin: Secondary | ICD-10-CM | POA: Diagnosis not present

## 2017-01-02 DIAGNOSIS — K219 Gastro-esophageal reflux disease without esophagitis: Secondary | ICD-10-CM | POA: Insufficient documentation

## 2017-01-02 DIAGNOSIS — F329 Major depressive disorder, single episode, unspecified: Secondary | ICD-10-CM | POA: Insufficient documentation

## 2017-01-02 DIAGNOSIS — R0600 Dyspnea, unspecified: Secondary | ICD-10-CM | POA: Diagnosis not present

## 2017-01-02 DIAGNOSIS — T782XXA Anaphylactic shock, unspecified, initial encounter: Secondary | ICD-10-CM | POA: Diagnosis present

## 2017-01-02 DIAGNOSIS — M79604 Pain in right leg: Secondary | ICD-10-CM | POA: Diagnosis not present

## 2017-01-02 DIAGNOSIS — R0789 Other chest pain: Secondary | ICD-10-CM | POA: Insufficient documentation

## 2017-01-02 DIAGNOSIS — L299 Pruritus, unspecified: Secondary | ICD-10-CM | POA: Insufficient documentation

## 2017-01-02 DIAGNOSIS — E669 Obesity, unspecified: Secondary | ICD-10-CM | POA: Insufficient documentation

## 2017-01-02 DIAGNOSIS — Z9884 Bariatric surgery status: Secondary | ICD-10-CM | POA: Diagnosis not present

## 2017-01-02 DIAGNOSIS — R109 Unspecified abdominal pain: Secondary | ICD-10-CM | POA: Diagnosis not present

## 2017-01-02 DIAGNOSIS — E1165 Type 2 diabetes mellitus with hyperglycemia: Secondary | ICD-10-CM | POA: Diagnosis not present

## 2017-01-02 DIAGNOSIS — R000 Tachycardia, unspecified: Secondary | ICD-10-CM | POA: Insufficient documentation

## 2017-01-02 DIAGNOSIS — G43909 Migraine, unspecified, not intractable, without status migrainosus: Secondary | ICD-10-CM | POA: Diagnosis not present

## 2017-01-02 DIAGNOSIS — Z7984 Long term (current) use of oral hypoglycemic drugs: Secondary | ICD-10-CM | POA: Insufficient documentation

## 2017-01-02 DIAGNOSIS — M79671 Pain in right foot: Principal | ICD-10-CM | POA: Insufficient documentation

## 2017-01-02 DIAGNOSIS — F419 Anxiety disorder, unspecified: Secondary | ICD-10-CM | POA: Diagnosis not present

## 2017-01-02 DIAGNOSIS — Z79899 Other long term (current) drug therapy: Secondary | ICD-10-CM | POA: Insufficient documentation

## 2017-01-02 DIAGNOSIS — R739 Hyperglycemia, unspecified: Secondary | ICD-10-CM | POA: Diagnosis present

## 2017-01-02 DIAGNOSIS — E119 Type 2 diabetes mellitus without complications: Secondary | ICD-10-CM

## 2017-01-02 HISTORY — DX: Type 2 diabetes mellitus without complications: E11.9

## 2017-01-02 MED ORDER — IPRATROPIUM-ALBUTEROL 0.5-2.5 (3) MG/3ML IN SOLN
3.0000 mL | Freq: Once | RESPIRATORY_TRACT | Status: AC
  Administered 2017-01-02: 3 mL via RESPIRATORY_TRACT
  Filled 2017-01-02: qty 3

## 2017-01-02 MED ORDER — METHYLPREDNISOLONE SODIUM SUCC 125 MG IJ SOLR
125.0000 mg | Freq: Once | INTRAMUSCULAR | Status: AC
  Administered 2017-01-02: 125 mg via INTRAVENOUS
  Filled 2017-01-02: qty 2

## 2017-01-02 MED ORDER — FAMOTIDINE IN NACL 20-0.9 MG/50ML-% IV SOLN
20.0000 mg | Freq: Once | INTRAVENOUS | Status: AC
  Administered 2017-01-02: 20 mg via INTRAVENOUS
  Filled 2017-01-02: qty 50

## 2017-01-02 MED ORDER — SODIUM CHLORIDE 0.9 % IV BOLUS (SEPSIS)
1000.0000 mL | Freq: Once | INTRAVENOUS | Status: AC
  Administered 2017-01-02: 1000 mL via INTRAVENOUS

## 2017-01-02 MED ORDER — ONDANSETRON HCL 4 MG/2ML IJ SOLN
4.0000 mg | Freq: Once | INTRAMUSCULAR | Status: AC
  Administered 2017-01-02: 4 mg via INTRAVENOUS
  Filled 2017-01-02: qty 2

## 2017-01-02 MED ORDER — ONDANSETRON 4 MG PO TBDP
ORAL_TABLET | ORAL | Status: AC
  Filled 2017-01-02: qty 1

## 2017-01-02 MED ORDER — METHOCARBAMOL 1000 MG/10ML IJ SOLN: 500.0000 mg | Freq: Once | INTRAMUSCULAR | Status: DC

## 2017-01-02 MED ORDER — DIPHENHYDRAMINE HCL 50 MG/ML IJ SOLN
25.0000 mg | Freq: Once | INTRAMUSCULAR | Status: AC
  Administered 2017-01-02: 25 mg via INTRAVENOUS
  Filled 2017-01-02: qty 1

## 2017-01-02 MED ORDER — MORPHINE SULFATE (PF) 4 MG/ML IV SOLN
4.0000 mg | Freq: Once | INTRAVENOUS | Status: AC
  Administered 2017-01-02: 4 mg via INTRAVENOUS
  Filled 2017-01-02: qty 1

## 2017-01-02 MED ORDER — LORAZEPAM 2 MG/ML IJ SOLN
1.0000 mg | Freq: Once | INTRAMUSCULAR | Status: AC
  Administered 2017-01-03: 1 mg via INTRAVENOUS
  Filled 2017-01-02: qty 1

## 2017-01-02 MED ORDER — METHOCARBAMOL 1000 MG/10ML IJ SOLN
500.0000 mg | Freq: Once | INTRAVENOUS | Status: AC
  Administered 2017-01-02: 500 mg via INTRAVENOUS
  Filled 2017-01-02: qty 5

## 2017-01-02 MED ORDER — EPINEPHRINE 0.3 MG/0.3ML IJ SOAJ
0.3000 mg | Freq: Once | INTRAMUSCULAR | Status: AC
  Administered 2017-01-02: 0.3 mg via INTRAMUSCULAR
  Filled 2017-01-02: qty 0.3

## 2017-01-02 NOTE — ED Provider Notes (Signed)
[facility]-EMERGENCY DEPT Provider Note   CSN: [id number] Arrival date & time: 01/02/17  2147     History   Chief Complaint Chief Complaint  Patient presents with  . Insect Bite  . Allergic Reaction    HPI Loretta Alexander is a 36 y.o. female with history of bee venom allergy as a child who presents following a suspected insect bite. Patient is a nurse in the emergency department and believes a bug got in her shoe and that her foot. This happened around 9 PM. Patient progressively began having numbness in her lips and spasm-type pain radiating from her right foot to up her leg. She's had difficulty breathing, abdominal pain, nausea, and vomiting. Patient reports a known bee venom allergy as a child, however is she knows of no allergies at this time. No medications taken prior to my evaluation. Patient denies any chest pain, swelling of her lips, tongue, throat.  HPI  Past Medical History:  Diagnosis Date  . Complication of anesthesia    Difficult IV stick with Central line placement, TACHY 180-190 S/P COLON SURGERY (resolved with treatment for pain)   . Depression   . Drug-seeking behavior   . Dysrhythmia    INCREASED HR AT TIMES  . Eye infection    within last 2 weeks  . Gastritis 03/2011   Per EGD  . GERD (gastroesophageal reflux disease)    OCC TUMS  . Intestinal obstruction (HCC)   . Migraines   . Obesity   . Ovarian cyst, right    Hx of oophorectomy for adhesions  . Pneumonia    04/2010  . Renal disorder    Kidney Stones  . Thyroid nodule   . UTI (lower urinary tract infection)     Patient Active Problem List   Diagnosis Date Noted  . Anaphylaxis 01/03/2017  . Vaginal discharge 12/18/2015  . Bowel obstruction (HCC) 12/17/2015  . Dysphagia 05/09/2015  . Neck swelling 05/08/2015  . S/P cervical spinal fusion 04/28/2015  . Migraine headache 01/20/2013  . Diplopia 01/20/2013  . Left-sided weakness 01/20/2013  . Right sided abdominal pain 01/11/2012  . Hyperglycemia  01/11/2012  . Drug-seeking behavior 01/11/2012  . Obesity 01/11/2012  . Constipation 01/11/2012  . Depression 08/31/2011  . Gastritis 04/06/2011  . Abdominal pain 04/03/2011  . Foreign body 04/03/2011  . Nausea with vomiting 04/03/2011    Past Surgical History:  Procedure Laterality Date  . ABDOMINAL ADHESION SURGERY  2006   BIL FALLOPIAN TUBES REMOVED  . ANTERIOR CERVICAL DECOMP/DISCECTOMY FUSION N/A 04/28/2015   Procedure: Cervical five-six, Cervical six - seven Anterior cervical decompression/diskectomy/fusion;  Surgeon: Tia Alert, MD;  Location: [facility];  Service: Neurosurgery;  Laterality: N/A;  C5-6 C6-7 Anterior cervical decompression/diskectomy/fusion  . APPENDECTOMY  1992  . CHOLECYSTECTOMY  2003  . COLON SURGERY     MAY 2015     BOWEL OBST s/p partial bowel resection [facility])  . ESOPHAGOGASTRODUODENOSCOPY  04/05/2011   Procedure: ESOPHAGOGASTRODUODENOSCOPY (EGD);  Surgeon: Charna Elizabeth, MD;  Location: [facility];  Service: Endoscopy;  Laterality: N/A;  . HERNIA REPAIR     umbilical  . KNEE ARTHROSCOPY  2001, 2010   left knee x 2  . LAPAROSCOPIC GASTRIC BANDING    . NASAL SEPTOPLASTY W/ TURBINOPLASTY  03/16/2011   Procedure: NASAL SEPTOPLASTY WITH TURBINATE REDUCTION;  Surgeon: Susy Frizzle, MD;  Location: [facility] OR;  Service: ENT;  Laterality: Bilateral;  . PILONIDAL CYST EXCISION    . removal of lap  band    . right ovary removed  2006  . TONSILLECTOMY  1991    OB History    No data available       Home Medications    Prior to Admission medications   Medication Sig Start Date End Date Taking? Authorizing Provider  DULoxetine (CYMBALTA) 60 MG capsule Take 60 mg by mouth daily.    Yes [provider]  levonorgestrel (MIRENA) 20 MCG/24HR IUD 1 each by Intrauterine route once. 2010   Yes [provider]  metFORMIN (GLUCOPHAGE-XR) 500 MG 24 hr tablet Take 500 mg by mouth daily with breakfast.   Yes [provider]    propranolol ER (INDERAL LA) 80 MG 24 hr capsule Take 1 capsule (80 mg total) by mouth daily. 06/16/16  Yes Jaffe, Adam R, DO  QUEtiapine (SEROQUEL) 50 MG tablet Take 150 mg by mouth at bedtime.   Yes [provider]  albuterol (PROVENTIL HFA;VENTOLIN HFA) 108 (90 Base) MCG/ACT inhaler Inhale 1-2 puffs into the lungs every 6 (six) hours as needed for wheezing or shortness of breath. Patient not taking: Reported on 01/03/2017 10/03/16   Lindalou Hose, MD  chlorproMAZINE (THORAZINE) 10 MG tablet Take 1 tablet (10 mg total) by mouth 3 (three) times daily. Patient not taking: Reported on 01/03/2017 09/05/16   Drema Dallas, DO  dicyclomine (BENTYL) 20 MG tablet Take 1 tablet (20 mg total) by mouth 2 (two) times daily. Patient not taking: Reported on 01/03/2017 06/26/16   Linwood Dibbles, MD  ketoprofen (ORUDIS) 75 MG capsule Take 1 capsule (75 mg total) by mouth 3 (three) times daily as needed. Patient not taking: Reported on 01/03/2017 09/04/16   Drema Dallas, DO  ondansetron (ZOFRAN ODT) 8 MG disintegrating tablet Take 1 tablet (8 mg total) by mouth every 8 (eight) hours as needed for nausea or vomiting. Patient not taking: Reported on 01/03/2017 06/26/16   Linwood Dibbles, MD  prochlorperazine (COMPAZINE) 10 MG tablet Take 1 tablet (10 mg total) by mouth 3 (three) times daily as needed for nausea or vomiting. Patient not taking: Reported on 01/03/2017 09/04/16   Drema Dallas, DO  traMADol-acetaminophen (ULTRACET) 37.5-325 MG tablet Take 1-2 tablets by mouth every 6 (six) hours as needed. Patient not taking: Reported on 01/03/2017 06/16/16   Drema Dallas, DO    Family History Family History  Problem Relation Age of Onset  . Stomach cancer Mother   . Heart attack Father 90  . Diabetes Maternal Grandmother   . Heart disease Maternal Grandmother     Social History Social History  Substance Use Topics  . Smoking status: Never Smoker  . Smokeless tobacco: Never Used  . Alcohol use Yes     Comment: rarely      Allergies   Prochlorperazine maleate; Dilaudid [hydromorphone hcl]; Compazine [prochlorperazine maleate]; Haloperidol and related; Reglan [metoclopramide hcl]; and Toradol [ketorolac tromethamine]   Review of Systems Review of Systems  Constitutional: Negative for chills and fever.  HENT: Negative for facial swelling and sore throat.   Respiratory: Positive for shortness of breath.   Cardiovascular: Negative for chest pain.  Gastrointestinal: Positive for abdominal pain, nausea and vomiting.  Genitourinary: Negative for dysuria.  Musculoskeletal: Negative for back pain.  Skin: Negative for rash and wound.  Neurological: Positive for numbness (lips) and headaches.  Psychiatric/Behavioral: The patient is not nervous/anxious.      Physical Exam Updated Vital Signs BP (!) 117/59   Pulse (!) 132   Temp 98.1 F (  36.7 C) (Oral)   Resp 15   Ht 5\' 2"  (1.575 m)   Wt 99.8 kg (220 lb)   SpO2 98%   BMI 40.24 kg/m   Physical Exam  Constitutional: She appears well-developed and well-nourished. No distress.  HENT:  Head: Normocephalic and atraumatic.  Mouth/Throat: Oropharynx is clear and moist. No oropharyngeal exudate.  No swelling of the lips, tongue; cannot visualize posterior oropharynx  Eyes: Pupils are equal, round, and reactive to light. Conjunctivae are normal. Right eye exhibits no discharge. Left eye exhibits no discharge. No scleral icterus.  Neck: Normal range of motion. Neck supple. No thyromegaly present.  Cardiovascular: Normal rate, regular rhythm, normal heart sounds and intact distal pulses.  Exam reveals no gallop and no friction rub.   No murmur heard. Pulmonary/Chest: Effort normal and breath sounds normal. No stridor. No respiratory distress. She has no wheezes. She has no rales.  Abdominal: Soft. Bowel sounds are normal. She exhibits no distension. There is tenderness in the left upper quadrant. There is no rebound and no guarding.    Musculoskeletal: She  exhibits no edema.       Feet:  Lymphadenopathy:    She has no cervical adenopathy.  Neurological: She is alert. Coordination normal.  5/5 strength to lower extremities, pain with movement of the right leg  Skin: Skin is warm and dry. No rash noted. She is not diaphoretic. No pallor.  Psychiatric: She has a normal mood and affect.  Nursing note and vitals reviewed.    ED Treatments / Results  Labs (all labs ordered are listed, but only abnormal results are displayed) Labs Reviewed  BASIC METABOLIC PANEL - Abnormal; Notable for the following:       Result Value   CO2 20 (*)    Glucose, Bld 222 (*)    All other components within normal limits  CBC WITH DIFFERENTIAL/PLATELET  TROPONIN I  HIV ANTIBODY (ROUTINE TESTING)  I-STAT TROPONIN, ED    EKG  EKG Interpretation None       Radiology No results found.  Procedures Procedures (including critical care time)  CRITICAL CARE Performed by: Emi Holes   Total critical care time: 30 minutes  Critical care time was exclusive of separately billable procedures and treating other patients.  Critical care was necessary to treat or prevent imminent or life-threatening deterioration.  Critical care was time spent personally by me on the following activities: development of treatment plan with patient and/or surrogate as well as nursing, discussions with consultants, evaluation of patient's response to treatment, examination of patient, obtaining history from patient or surrogate, ordering and performing treatments and interventions, ordering and review of laboratory studies, ordering and review of radiographic studies, pulse oximetry and re-evaluation of patient's condition.   Medications Ordered in ED Medications  ondansetron (ZOFRAN-ODT) 4 MG disintegrating tablet (not administered)  acetaminophen (TYLENOL) tablet 1,000 mg (not administered)  diazepam (VALIUM) tablet 5 mg (not administered)  0.9 %  sodium chloride  infusion (not administered)  morphine 4 MG/ML injection 4 mg (not administered)  LORazepam (ATIVAN) injection 1 mg (not administered)  promethazine (PHENERGAN) tablet 25 mg (not administered)    Or  promethazine (PHENERGAN) injection 12.5 mg (not administered)    Or  promethazine (PHENERGAN) suppository 25 mg (not administered)  propranolol ER (INDERAL LA) 24 hr capsule 80 mg (not administered)  metFORMIN (GLUCOPHAGE-XR) 24 hr tablet 500 mg (not administered)  QUEtiapine (SEROQUEL) tablet 150 mg (not administered)  DULoxetine (CYMBALTA) DR capsule 60 mg (not  administered)  enoxaparin (LOVENOX) injection 40 mg (not administered)  insulin aspart (novoLOG) injection 0-9 Units (not administered)  insulin aspart (novoLOG) injection 0-5 Units (not administered)  methylPREDNISolone sodium succinate (SOLU-MEDROL) 125 mg/2 mL injection 60 mg (not administered)  famotidine (PEPCID) tablet 20 mg (not administered)  diphenhydrAMINE (BENADRYL) capsule 25 mg (not administered)  ondansetron (ZOFRAN) injection 4 mg (not administered)  methylPREDNISolone sodium succinate (SOLU-MEDROL) 125 mg/2 mL injection 125 mg (125 mg Intravenous Given 01/02/17 2313)  diphenhydrAMINE (BENADRYL) injection 25 mg (25 mg Intravenous Given 01/02/17 2313)  famotidine (PEPCID) IVPB 20 mg premix (0 mg Intravenous Stopped 01/03/17 0031)  EPINEPHrine (EPI-PEN) injection 0.3 mg (0.3 mg Intramuscular Given 01/02/17 2237)  sodium chloride 0.9 % bolus 1,000 mL (1,000 mLs Intravenous New Bag/Given 01/02/17 2313)  methocarbamol (ROBAXIN) 500 mg in dextrose 5 % 50 mL IVPB (0 mg Intravenous Stopped 01/03/17 0031)  ipratropium-albuterol (DUONEB) 0.5-2.5 (3) MG/3ML nebulizer solution 3 mL (3 mLs Nebulization Given 01/02/17 2341)  morphine 4 MG/ML injection 4 mg (4 mg Intravenous Given 01/02/17 2347)  ondansetron (ZOFRAN) injection 4 mg (4 mg Intravenous Given 01/02/17 2346)  LORazepam (ATIVAN) injection 1 mg (1 mg Intravenous Given 01/03/17 0024)    diphenhydrAMINE (BENADRYL) injection 25 mg (25 mg Intravenous Given 01/03/17 0024)  diphenhydrAMINE (BENADRYL) injection 25 mg (25 mg Intravenous Given 01/03/17 0114)  EPINEPHrine (EPI-PEN) injection 0.3 mg (0.3 mg Intramuscular Given 01/03/17 0114)     Initial Impression / Assessment and Plan / ED Course  I have reviewed the triage vital signs and the nursing notes.  Pertinent labs & imaging results that were available during my care of the patient were reviewed by me and considered in my medical decision making (see chart for details).     10:20p Patient presenting with several system involvement following suspected insect bite, concern for anaphylaxis, indication for epinephrine. We'll also initiate Benadryl, Solu-Medrol, Pepcid, fluids. Patient also given Robaxin for associated spasming of right leg.  11:10p On reevaluation after epinephrine, patient complaining of chest pressure. Patient with clear breath sounds, no wheezing. No signs of respiratory distress. Patient does appear anxious, however we will send CBC, BMP, troponin, repeat EKG.  12:45a Patient's heart rate is in the 130s despite above medications and Ativan. She continues to report waxing and waning episodes of shortness of breath, chest tightness, abdominal cramping, severe itching, and right leg pain. Will reorder epinephrine and proceed to hospital admission.  Patient with continued symptoms despite second epinephrine and more Benadryl. Labs are unremarkable. I consulted [facility] and spoke with Dr. Maryfrances Bunnell who will admit the patient for further evaluation and treatment. Patient understands and agrees with plan. Patient also evaluated by Dr. Erma Heritage regarding the patient's management and agrees with plan.  Final Clinical Impressions(s) / ED Diagnoses   Final diagnoses:  Anaphylaxis, initial encounter    New Prescriptions New Prescriptions   No medications on file     Verdis Prime 01/03/17 0245     Shaune Pollack, MD 01/03/17 [phone]

## 2017-01-02 NOTE — ED Triage Notes (Signed)
Pt. suspects insect bite at right lateral foot this evening with generalized skin itching , burning sensation at right leg and nausea at triage .

## 2017-01-03 ENCOUNTER — Observation Stay (HOSPITAL_COMMUNITY): Payer: 59

## 2017-01-03 ENCOUNTER — Encounter (HOSPITAL_COMMUNITY): Payer: Self-pay | Admitting: Family Medicine

## 2017-01-03 DIAGNOSIS — T782XXA Anaphylactic shock, unspecified, initial encounter: Secondary | ICD-10-CM | POA: Diagnosis not present

## 2017-01-03 DIAGNOSIS — R0600 Dyspnea, unspecified: Secondary | ICD-10-CM | POA: Diagnosis not present

## 2017-01-03 DIAGNOSIS — E118 Type 2 diabetes mellitus with unspecified complications: Secondary | ICD-10-CM

## 2017-01-03 DIAGNOSIS — K219 Gastro-esophageal reflux disease without esophagitis: Secondary | ICD-10-CM | POA: Diagnosis not present

## 2017-01-03 DIAGNOSIS — M79671 Pain in right foot: Secondary | ICD-10-CM | POA: Diagnosis not present

## 2017-01-03 DIAGNOSIS — R0602 Shortness of breath: Secondary | ICD-10-CM | POA: Diagnosis not present

## 2017-01-03 DIAGNOSIS — T782XXD Anaphylactic shock, unspecified, subsequent encounter: Secondary | ICD-10-CM

## 2017-01-03 DIAGNOSIS — R739 Hyperglycemia, unspecified: Secondary | ICD-10-CM

## 2017-01-03 DIAGNOSIS — R202 Paresthesia of skin: Secondary | ICD-10-CM | POA: Diagnosis not present

## 2017-01-03 DIAGNOSIS — F329 Major depressive disorder, single episode, unspecified: Secondary | ICD-10-CM | POA: Diagnosis not present

## 2017-01-03 DIAGNOSIS — R109 Unspecified abdominal pain: Secondary | ICD-10-CM | POA: Diagnosis not present

## 2017-01-03 DIAGNOSIS — R0789 Other chest pain: Secondary | ICD-10-CM | POA: Diagnosis not present

## 2017-01-03 DIAGNOSIS — M79604 Pain in right leg: Secondary | ICD-10-CM | POA: Diagnosis not present

## 2017-01-03 DIAGNOSIS — E669 Obesity, unspecified: Secondary | ICD-10-CM | POA: Diagnosis not present

## 2017-01-03 DIAGNOSIS — E119 Type 2 diabetes mellitus without complications: Secondary | ICD-10-CM

## 2017-01-03 HISTORY — DX: Type 2 diabetes mellitus without complications: E11.9

## 2017-01-03 LAB — BASIC METABOLIC PANEL
Anion gap: 11 (ref 5–15)
BUN: 13 mg/dL (ref 6–20)
CO2: 20 mmol/L — ABNORMAL LOW (ref 22–32)
Calcium: 9.3 mg/dL (ref 8.9–10.3)
Chloride: 106 mmol/L (ref 101–111)
Creatinine, Ser: 0.69 mg/dL (ref 0.44–1.00)
GFR calc Af Amer: >60 mL/min (ref >60)
GFR calc non Af Amer: >60 mL/min (ref >60)
Glucose, Bld: 222 mg/dL — ABNORMAL HIGH (ref 65–99)
Potassium: 3.6 mmol/L (ref 3.5–5.1)
Sodium: 137 mmol/L (ref 135–145)

## 2017-01-03 LAB — CBC WITH DIFFERENTIAL/PLATELET
Basophils Absolute: 0 10*3/uL (ref 0.0–0.1)
Basophils Relative: 0 %
Eosinophils Absolute: 0.1 10*3/uL (ref 0.0–0.7)
Eosinophils Relative: 1 %
HCT: 39.1 % (ref 36.0–46.0)
Hemoglobin: 13.2 g/dL (ref 12.0–15.0)
Lymphocytes Relative: 35 %
Lymphs Abs: 3.6 10*3/uL (ref 0.7–4.0)
MCH: 29.7 pg (ref 26.0–34.0)
MCHC: 33.8 g/dL (ref 30.0–36.0)
MCV: 87.9 fL (ref 78.0–100.0)
Monocytes Absolute: 0.9 10*3/uL (ref 0.1–1.0)
Monocytes Relative: 9 %
Neutro Abs: 5.6 10*3/uL (ref 1.7–7.7)
Neutrophils Relative %: 55 %
Platelets: 318 10*3/uL (ref 150–400)
RBC: 4.45 MIL/uL (ref 3.87–5.11)
RDW: 13.4 % (ref 11.5–15.5)
WBC: 10.3 10*3/uL (ref 4.0–10.5)

## 2017-01-03 LAB — TROPONIN I: Troponin I: <0.03 ng/mL (ref <0.03)

## 2017-01-03 LAB — HIV ANTIBODY (ROUTINE TESTING W REFLEX): HIV SCREEN 4TH GENERATION: NONREACTIVE

## 2017-01-03 LAB — GLUCOSE, CAPILLARY: GLUCOSE-CAPILLARY: 310 mg/dL — AB (ref 65–99)

## 2017-01-03 LAB — I-STAT TROPONIN, ED: Troponin i, poc: 0 ng/mL (ref 0.00–0.08)

## 2017-01-03 LAB — D-DIMER, QUANTITATIVE (NOT AT ARMC): D DIMER QUANT: 0.64 ug{FEU}/mL — AB (ref 0.00–0.50)

## 2017-01-03 MED ORDER — METFORMIN HCL ER 500 MG PO TB24
1000.0000 mg | ORAL_TABLET | Freq: Two times a day (BID) | ORAL | 0 refills | Status: DC
Start: 2017-01-03 — End: 2017-04-14

## 2017-01-03 MED ORDER — DULOXETINE HCL 60 MG PO CPEP
60.0000 mg | ORAL_CAPSULE | Freq: Every day | ORAL | Status: DC
Start: 2017-01-03 — End: 2017-01-03
  Filled 2017-01-03: qty 1

## 2017-01-03 MED ORDER — METFORMIN HCL ER 500 MG PO TB24
500.0000 mg | ORAL_TABLET | Freq: Every day | ORAL | Status: DC
  Administered 2017-01-03: 500 mg via ORAL
  Filled 2017-01-03: qty 1

## 2017-01-03 MED ORDER — PROPRANOLOL HCL ER 80 MG PO CP24
80.0000 mg | ORAL_CAPSULE | Freq: Every day | ORAL | Status: DC
  Administered 2017-01-03: 80 mg via ORAL
  Filled 2017-01-03: qty 1

## 2017-01-03 MED ORDER — TRAMADOL HCL 50 MG PO TABS
50.0000 mg | ORAL_TABLET | Freq: Two times a day (BID) | ORAL | Status: DC | PRN
  Administered 2017-01-03: 50 mg via ORAL
  Filled 2017-01-03: qty 1

## 2017-01-03 MED ORDER — QUETIAPINE FUMARATE 50 MG PO TABS: 150.0000 mg | ORAL_TABLET | Freq: Every day | ORAL | Status: DC

## 2017-01-03 MED ORDER — EPINEPHRINE 0.3 MG/0.3ML IJ SOAJ
0.3000 mg | Freq: Once | INTRAMUSCULAR | Status: AC
  Administered 2017-01-03: 0.3 mg via INTRAMUSCULAR
  Filled 2017-01-03: qty 0.3

## 2017-01-03 MED ORDER — PROMETHAZINE HCL 25 MG/ML IJ SOLN
12.5000 mg | Freq: Four times a day (QID) | INTRAMUSCULAR | Status: DC | PRN
  Administered 2017-01-03: 12.5 mg via INTRAVENOUS
  Filled 2017-01-03: qty 1

## 2017-01-03 MED ORDER — ENOXAPARIN SODIUM 40 MG/0.4ML ~~LOC~~ SOLN: 40.0000 mg | SUBCUTANEOUS | Status: DC

## 2017-01-03 MED ORDER — DIPHENHYDRAMINE HCL 25 MG PO CAPS
25.0000 mg | ORAL_CAPSULE | Freq: Three times a day (TID) | ORAL | Status: DC
  Administered 2017-01-03: 25 mg via ORAL
  Filled 2017-01-03: qty 1

## 2017-01-03 MED ORDER — LORAZEPAM 2 MG/ML IJ SOLN
1.0000 mg | Freq: Once | INTRAMUSCULAR | Status: AC
  Administered 2017-01-03: 1 mg via INTRAVENOUS
  Filled 2017-01-03: qty 1

## 2017-01-03 MED ORDER — PROMETHAZINE HCL 25 MG PO TABS: 25.0000 mg | ORAL_TABLET | Freq: Four times a day (QID) | ORAL | Status: DC | PRN

## 2017-01-03 MED ORDER — INSULIN ASPART 100 UNIT/ML ~~LOC~~ SOLN
0.0000 [IU] | Freq: Three times a day (TID) | SUBCUTANEOUS | Status: DC
  Administered 2017-01-03: 7 [IU] via SUBCUTANEOUS

## 2017-01-03 MED ORDER — ACETAMINOPHEN 500 MG PO TABS
1000.0000 mg | ORAL_TABLET | Freq: Four times a day (QID) | ORAL | Status: DC | PRN
  Administered 2017-01-03: 1000 mg via ORAL
  Filled 2017-01-03 (×2): qty 2

## 2017-01-03 MED ORDER — ONDANSETRON HCL 4 MG/2ML IJ SOLN: 4.0000 mg | Freq: Once | INTRAMUSCULAR | Status: DC

## 2017-01-03 MED ORDER — METHYLPREDNISOLONE SODIUM SUCC 125 MG IJ SOLR
60.0000 mg | Freq: Every day | INTRAMUSCULAR | Status: DC
  Filled 2017-01-03: qty 2

## 2017-01-03 MED ORDER — MORPHINE SULFATE (PF) 4 MG/ML IV SOLN
4.0000 mg | Freq: Once | INTRAVENOUS | Status: AC | PRN
  Administered 2017-01-03: 4 mg via INTRAVENOUS
  Filled 2017-01-03: qty 1

## 2017-01-03 MED ORDER — FAMOTIDINE 20 MG PO TABS: 20.0000 mg | ORAL_TABLET | Freq: Two times a day (BID) | ORAL | 0 refills | Status: DC

## 2017-01-03 MED ORDER — DIPHENHYDRAMINE HCL 50 MG/ML IJ SOLN
25.0000 mg | Freq: Once | INTRAMUSCULAR | Status: AC
  Administered 2017-01-03: 25 mg via INTRAVENOUS
  Filled 2017-01-03: qty 1

## 2017-01-03 MED ORDER — IOPAMIDOL (ISOVUE-370) INJECTION 76%
INTRAVENOUS | Status: AC
  Administered 2017-01-03: 100 mL
  Filled 2017-01-03: qty 100

## 2017-01-03 MED ORDER — DIPHENHYDRAMINE HCL 25 MG PO CAPS: 25.0000 mg | ORAL_CAPSULE | Freq: Two times a day (BID) | ORAL | Status: DC

## 2017-01-03 MED ORDER — INSULIN ASPART 100 UNIT/ML ~~LOC~~ SOLN: 0.0000 [IU] | Freq: Every day | SUBCUTANEOUS | Status: DC

## 2017-01-03 MED ORDER — PROMETHAZINE HCL 25 MG RE SUPP: 25.0000 mg | Freq: Four times a day (QID) | RECTAL | Status: DC | PRN

## 2017-01-03 MED ORDER — FAMOTIDINE 20 MG PO TABS
20.0000 mg | ORAL_TABLET | Freq: Two times a day (BID) | ORAL | Status: DC
  Administered 2017-01-03 (×2): 20 mg via ORAL
  Filled 2017-01-03 (×2): qty 1

## 2017-01-03 MED ORDER — SODIUM CHLORIDE 0.9 % IV SOLN
INTRAVENOUS | Status: AC
  Administered 2017-01-03: 03:00:00 via INTRAVENOUS

## 2017-01-03 MED ORDER — DIAZEPAM 5 MG PO TABS
5.0000 mg | ORAL_TABLET | Freq: Three times a day (TID) | ORAL | Status: DC | PRN
  Administered 2017-01-03: 5 mg via ORAL
  Filled 2017-01-03 (×2): qty 1

## 2017-01-03 MED ORDER — CETIRIZINE HCL 10 MG PO CAPS: 10.0000 mg | ORAL_CAPSULE | Freq: Every day | ORAL | 0 refills | Status: DC

## 2017-01-03 MED ORDER — EPINEPHRINE 0.3 MG/0.3ML IJ SOAJ: 0.3000 mg | Freq: Once | INTRAMUSCULAR | 1 refills | Status: AC

## 2017-01-03 MED FILL — METFORMIN HCL ER 500 MG TAB: 500 | 30 days supply | Qty: 120 | Fill #0

## 2017-01-03 MED FILL — EPIPEN 2-PAK 0.3 MG AUTO-IN: 0.3 | 30 days supply | Qty: 2 | Fill #0

## 2017-01-03 MED FILL — FAMOTIDINE 20 MG TABLET: 20 | 30 days supply | Qty: 60 | Fill #0

## 2017-01-03 NOTE — ED Notes (Signed)
Patient transported to X-ray 

## 2017-01-03 NOTE — ED Notes (Signed)
Attempted to call report

## 2017-01-03 NOTE — ED Notes (Signed)
Pt states that the pain starts in the area that was bitten then shoots up the leg and to the whole body. Pt is c/o itching and chest pain and short of breath.

## 2017-01-03 NOTE — Discharge Summary (Signed)
Physician Discharge Summary  Loretta Alexander Crum ZOX:[id number] DOB: 12/03/1980 DOA: 01/02/2017  PCP: Wilfrid LundBecker, Anna G, PA  Admit date: 01/02/2017 Discharge date: 01/03/2017  Admitted From: HOME  Disposition: HOME   Recommendations for Outpatient Follow-up:  1. Follow up with PCP in 3-5 days 2. Please obtain BMP/CBC in one week 3. Please titrate diabetes care treatment plan for better glycemic control 4. Please refer to licensed certified allergist   Discharge Condition: STABLE   CODE STATUS: FULL    Brief Hospitalization Summary: Please see all hospital notes, images, labs for full details of the hospitalization. HPI: Loretta Alexander Mom is a 36 y.o. female with a past medical history significant for migraines who presents with acute hives, itching, and chest discomfort.  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  She kicked her shoe off when she was out of the room and thought she saw a redness, but saw no insect.  Within about 20 minutes, she started to have red splotches on her arms and chest, generalized itching, nausea, abdominal cramps, chest tightness and dyspnea, so she left work and checked herself into the ER as a patient.  She recalls being told she was "allergic" to beestings as a child, has not been stung since.  She denies other new meds or ingestions.  ED course: -Afebrile, heart rate 120, respirations and pulse ox normal, BP 145/99 -Na 137, Alexander 3.6, Cr 0.69, WBC 10.3K, Hgb 13.2 -ECG sinus tachycardia -She was given epinephrine IM, famotidine/diphenhydramine, Solu-medrol -She was given albuterol, lorazepam, Robaxin, Ondasetron, and IV fluids -She appeared to have minimal improvement with these treatments and TRH were asked to evaluate for anaphylaxis with simmering symptoms (nausea,  cramps, chest tightness/dyspnea but also leg pain, lip tingling) despite epinephrine  1. Possible anaphylaxis:  Overall her symptom constellation is nonspecific, there is no evidence for an inciting factor (there was no insect seen, not likely to have been a stinging insect in the ER, and no evidence of a sting on her foot), and her presentation is atypical for anaphylaxis (to me she primarily complains of pain). The symptom set is probably also shared by opiate withdrawal (nausea, generalized skin tingling, pain, tachycardia) or panic attacks (chest tightness, dyspnea, tingling, nausea).  Given patient's employment, she is understandably guarded about the opiate use and denies.   EDP felt patient was having anaphylactic reaction warranting two doses of IM epinephrine. In light of that, although I am somewhat doubtful that she had any type of allergic reaction to anything, I agree it is warranted to observe overnight.  There is no leukocytosis, electrolyte disturbance or evidence of cardiac ischemia that are concerning.  Reviewing her chart suggests the possibility of something like the former DSM-IV dx of somatization disorder, or somatic symptom disorder (frequent somatic complaints, in disparate systems, without objective diagnoses, causing significant distress).   -Observe overnight in stepdown for signs of anaphylaxis -Continue steroid for 24 hours -Could continue famotidine and diphenhydramine for 24-48 hours -Will discharge with two auto-injectors and emergency action plan  -Patient counseled to get Allergy/Imm referral from PCP at discharge   Pt discharged on famotidine and cetirizine.  Follow up with PCP for recheck in 3-5 days.  Return to ED if symptoms recur.  Epi-Pen prescribed at discharge.  2. Diabetes:  -Increased home glucophage XR to 1000 mg po BID with meals. Follow up with PCP for further titration.   3. Other medications:  -Continue duloxetine -Continue Seroquel -Continue  propranolol for migraines  4.  Sinus tachycardia - I suspect this is from steroids and epi injections that were given.  The d dimer was elevated and a CTA chest was negative for acute PE.  Follow up with PCP for recheck.     Discharge Diagnoses:  Principal Problem:   Anaphylaxis Active Problems:   Hyperglycemia   Type 2 diabetes mellitus (HCC)  Discharge Instructions: Discharge Instructions    Call MD for:  difficulty breathing, headache or visual disturbances    Complete by:  As directed    Call MD for:  extreme fatigue    Complete by:  As directed    Call MD for:  hives    Complete by:  As directed    Call MD for:  persistant dizziness or light-headedness    Complete by:  As directed    Call MD for:  persistant nausea and vomiting    Complete by:  As directed    Call MD for:  severe uncontrolled pain    Complete by:  As directed    Call MD for:  temperature >100.4    Complete by:  As directed    Diet - low sodium heart healthy    Complete by:  As directed    Increase activity slowly    Complete by:  As directed      Allergies as of 01/03/2017      Reactions   Prochlorperazine Maleate Other (See Comments)   Patient can tolerate phenergan Major anxiety attack   Dilaudid [hydromorphone Hcl] Itching   Ok with benadryl   Compazine [prochlorperazine Maleate] Anxiety, Other (See Comments)   dizziness   Haloperidol And Related Anxiety, Other (See Comments)   dizziness   Reglan [metoclopramide Hcl] Anxiety, Other (See Comments)   dizziness   Toradol [ketorolac Tromethamine] Nausea And Vomiting      Medication List    STOP taking these medications   albuterol 108 (90 Base) MCG/ACT inhaler Commonly known as:  PROVENTIL HFA;VENTOLIN HFA   chlorproMAZINE 10 MG tablet Commonly known as:  THORAZINE   dicyclomine 20 MG tablet Commonly known as:  BENTYL   ketoprofen 75 MG capsule Commonly known as:  ORUDIS   ondansetron 8 MG disintegrating tablet Commonly known as:   ZOFRAN ODT   prochlorperazine 10 MG tablet Commonly known as:  COMPAZINE   traMADol-acetaminophen 37.5-325 MG tablet Commonly known as:  ULTRACET     TAKE these medications   Cetirizine HCl 10 MG Caps Take 1 capsule (10 mg total) by mouth daily.   DULoxetine 60 MG capsule Commonly known as:  CYMBALTA Take 60 mg by mouth daily.   EPINEPHrine 0.3 mg/0.3 mL Soaj injection Commonly known as:  EPIPEN 2-PAK Inject 0.3 mLs (0.3 mg total) into the muscle once.   famotidine 20 MG tablet Commonly known as:  PEPCID Take 1 tablet (20 mg total) by mouth 2 (two) times daily.   levonorgestrel 20 MCG/24HR IUD Commonly known as:  MIRENA 1 each by Intrauterine route once. 2010   metFORMIN 500 MG 24 hr tablet Commonly known as:  GLUCOPHAGE-XR Take 2 tablets (1,000 mg total) by mouth 2 (two) times daily with a meal. What changed:  how much to take  when to take this   propranolol ER 80 MG 24 hr  capsule Commonly known as:  INDERAL LA Take 1 capsule (80 mg total) by mouth daily.   QUEtiapine 50 MG tablet Commonly known as:  SEROQUEL Take 150 mg by mouth at bedtime.            Discharge Care Instructions        Start     Ordered   01/03/17 0000  famotidine (PEPCID) 20 MG tablet  2 times daily     01/03/17 1127   01/03/17 0000  metFORMIN (GLUCOPHAGE-XR) 500 MG 24 hr tablet  2 times daily with meals     01/03/17 1127   01/03/17 0000  Cetirizine HCl 10 MG CAPS  Daily     01/03/17 1127   01/03/17 0000  EPINEPHrine (EPIPEN 2-PAK) 0.3 mg/0.3 mL IJ SOAJ injection   Once     01/03/17 1127   01/03/17 0000  Increase activity slowly     01/03/17 1127   01/03/17 0000  Diet - low sodium heart healthy     01/03/17 1127   01/03/17 0000  Call MD for:  temperature >100.4     01/03/17 1127   01/03/17 0000  Call MD for:  persistant nausea and vomiting     01/03/17 1127   01/03/17 0000  Call MD for:  severe uncontrolled pain     01/03/17 1127   01/03/17 0000  Call MD for:  difficulty  breathing, headache or visual disturbances     01/03/17 1127   01/03/17 0000  Call MD for:  hives     01/03/17 1127   01/03/17 0000  Call MD for:  persistant dizziness or light-headedness     01/03/17 1127   01/03/17 0000  Call MD for:  extreme fatigue     01/03/17 1127     Follow-up Information    Wilfrid Lund, PA. Schedule an appointment as soon as possible for a visit in 5 week(s).   Specialty:  Family Medicine Why:  Hospital Follow Up  Contact information: [address] [place] [address] [phone]          Allergies  Allergen Reactions  . Prochlorperazine Maleate Other (See Comments)    Patient can tolerate phenergan Major anxiety attack  . Dilaudid [Hydromorphone Hcl] Itching    Ok with benadryl  . Compazine [Prochlorperazine Maleate] Anxiety and Other (See Comments)    dizziness  . Haloperidol And Related Anxiety and Other (See Comments)    dizziness  . Reglan [Metoclopramide Hcl] Anxiety and Other (See Comments)    dizziness  . Toradol [Ketorolac Tromethamine] Nausea And Vomiting   Current Discharge Medication List    START taking these medications   Details  Cetirizine HCl 10 MG CAPS Take 1 capsule (10 mg total) by mouth daily. Qty: 30 capsule, Refills: 0    EPINEPHrine (EPIPEN 2-PAK) 0.3 mg/0.3 mL IJ SOAJ injection Inject 0.3 mLs (0.3 mg total) into the muscle once. Qty: 1 Device, Refills: 1    famotidine (PEPCID) 20 MG tablet Take 1 tablet (20 mg total) by mouth 2 (two) times daily. Qty: 60 tablet, Refills: 0      CONTINUE these medications which have CHANGED   Details  metFORMIN (GLUCOPHAGE-XR) 500 MG 24 hr tablet Take 2 tablets (1,000 mg total) by mouth 2 (two) times daily with a meal. Qty: 120 tablet, Refills: 0      CONTINUE these medications which have NOT CHANGED   Details  DULoxetine (CYMBALTA) 60 MG capsule Take 60  mg by mouth daily.     levonorgestrel (MIRENA) 20 MCG/24HR IUD 1 each by Intrauterine route once. 2010     propranolol ER (INDERAL LA) 80 MG 24 hr capsule Take 1 capsule (80 mg total) by mouth daily. Qty: 30 capsule, Refills: 2    QUEtiapine (SEROQUEL) 50 MG tablet Take 150 mg by mouth at bedtime.      STOP taking these medications     albuterol (PROVENTIL HFA;VENTOLIN HFA) 108 (90 Base) MCG/ACT inhaler      chlorproMAZINE (THORAZINE) 10 MG tablet      dicyclomine (BENTYL) 20 MG tablet      ketoprofen (ORUDIS) 75 MG capsule      ondansetron (ZOFRAN ODT) 8 MG disintegrating tablet      prochlorperazine (COMPAZINE) 10 MG tablet      traMADol-acetaminophen (ULTRACET) 37.5-325 MG tablet         Procedures/Studies: Ct Angio Chest Pe W Or Wo Contrast  Result Date: 01/03/2017 CLINICAL DATA:  35 YOF. States she was bitten last night by a bug and had shortness of breathe, nausea and vomiting after. History of bee venom allergy. Denies chest pains or history of DVT or pulmonary embolus. Symptoms resolved at time of scan. EXAM: CT ANGIOGRAPHY CHEST WITH CONTRAST TECHNIQUE: Multidetector CT imaging of the chest was performed using the standard protocol during bolus administration of intravenous contrast. Multiplanar CT image reconstructions and MIPs were obtained to evaluate the vascular anatomy. CONTRAST:  100 mL of Isovue 370 intravenous contrast COMPARISON:  Chest radiograph, 12/18/2016.  Chest CTA, 10/03/2016. FINDINGS: Cardiovascular: Contrast opacification of the pulmonary arteries is mildly suboptimal, limiting assessment of the segmental and subsegmental vessels for pulmonary emboli. Allowing for this mild limitation, there is no evidence of a pulmonary embolism. Heart is normal in size and configuration. There are no significant coronary artery calcifications. The great vessels normal in caliber. No aortic dissection or atherosclerosis. Mediastinum/Nodes: No enlarged mediastinal, hilar, or axillary lymph nodes. Thyroid gland, trachea, and esophagus demonstrate no significant findings.  Lungs/Pleura: Lungs are clear. No pleural effusion or pneumothorax. Upper Abdomen: No acute abnormality. Musculoskeletal: No chest wall abnormality. No acute or significant osseous findings. Review of the MIP images confirms the above findings. IMPRESSION: 1. No evidence of a pulmonary embolism. 2. No acute findings. Electronically Signed   By: Amie Portland M.D.   On: 01/03/2017 09:54   Dg Foot 2 Views Right  Result Date: 01/03/2017 CLINICAL DATA:  Nontraumatic right lateral foot and ankle pain. Question recent insect bite EXAM: RIGHT FOOT - 2 VIEW COMPARISON:  None. FINDINGS: No fracture or other acute bone abnormality. No soft tissue foreign body. No soft tissue gas. No arthropathic changes. IMPRESSION: Negative. Electronically Signed   By: Ellery Plunk M.D.   On: 01/03/2017 04:07      Subjective: Pt says she feels much better and wants to discharge home.  She has no cp or SOB. Edema has resolved, she is eating and drinking well.    Discharge Exam: Vitals:   01/03/17 0500 01/03/17 0747  BP: (!) 102/49 136/90  Pulse: (!) 163 (!) 129  Resp:  18  Temp: 98 F (36.7 C) 98.2 F (36.8 C)  SpO2: (!) 89% 98%   Vitals:   01/03/17 0230 01/03/17 0437 01/03/17 0500 01/03/17 0747  BP: (!) 103/46 126/71 (!) 102/49 136/90  Pulse: (!) 133 (!) 128 (!) 163 (!) 129  Resp: (!) 23 (!) 22  18  Temp:   98 F (36.7 C) 98.2 F (  36.8 C)  TempSrc:   Oral Oral  SpO2: 95% 99% (!) 89% 98%  Weight:   106.1 kg (233 lb 14.4 oz)   Height:   5\' 2"  (1.575 m)    General: Pt is alert, awake, not in acute distress Cardiovascular: RRR, S1/S2 +, no rubs, no gallops Respiratory: CTA bilaterally, no wheezing, no rhonchi Abdominal: Soft, NT, ND, bowel sounds + Extremities: no edema, no cyanosis   The results of significant diagnostics from this hospitalization (including imaging, microbiology, ancillary and laboratory) are listed below for reference.    Microbiology: No results found for this or any previous  visit (from the past 240 hour(s)).   Labs: BNP (last 3 results) No results for input(s): BNP in the last 8760 hours. Basic Metabolic Panel:  Recent Labs Lab 01/02/17 0013  NA 137  Alexander 3.6  CL 106  CO2 20*  GLUCOSE 222*  BUN 13  CREATININE 0.69  CALCIUM 9.3   Liver Function Tests: No results for input(s): AST, ALT, ALKPHOS, BILITOT, PROT, ALBUMIN in the last 168 hours. No results for input(s): LIPASE, AMYLASE in the last 168 hours. No results for input(s): AMMONIA in the last 168 hours. CBC:  Recent Labs Lab 01/02/17 0013  WBC 10.3  NEUTROABS 5.6  HGB 13.2  HCT 39.1  MCV 87.9  PLT 318   Cardiac Enzymes:  Recent Labs Lab 01/03/17 0214  TROPONINI <0.03   BNP: Invalid input(s): POCBNP CBG:  Recent Labs Lab 01/03/17 0821  GLUCAP 310*   D-Dimer  Recent Labs  01/03/17 0656  DDIMER 0.64*   Hgb A1c No results for input(s): HGBA1C in the last 72 hours. Lipid Profile No results for input(s): CHOL, HDL, LDLCALC, TRIG, CHOLHDL, LDLDIRECT in the last 72 hours. Thyroid function studies No results for input(s): TSH, T4TOTAL, T3FREE, THYROIDAB in the last 72 hours.  Invalid input(s): FREET3 Anemia work up No results for input(s): VITAMINB12, FOLATE, FERRITIN, TIBC, IRON, RETICCTPCT in the last 72 hours. Urinalysis    Component Value Date/Time   COLORURINE YELLOW 06/26/2016 1306   APPEARANCEUR CLEAR 06/26/2016 1306   APPEARANCEUR Cloudy 12/29/2013 1640   LABSPEC 1.014 06/26/2016 1306   LABSPEC 1.015 12/29/2013 1640   PHURINE 7.0 06/26/2016 1306   GLUCOSEU NEGATIVE 06/26/2016 1306   GLUCOSEU Negative 12/29/2013 1640   HGBUR NEGATIVE 06/26/2016 1306   BILIRUBINUR NEGATIVE 06/26/2016 1306   BILIRUBINUR Negative 12/29/2013 1640   KETONESUR NEGATIVE 06/26/2016 1306   PROTEINUR NEGATIVE 06/26/2016 1306   UROBILINOGEN 1.0 04/13/2014 1646   NITRITE NEGATIVE 06/26/2016 1306   LEUKOCYTESUR NEGATIVE 06/26/2016 1306   LEUKOCYTESUR Negative 12/29/2013 1640    Sepsis Labs Invalid input(s): PROCALCITONIN,  WBC,  LACTICIDVEN Microbiology No results found for this or any previous visit (from the past 240 hour(s)).  Time coordinating discharge: 35 minutes  SIGNED:  Standley Dakins, MD  [facility] 01/03/2017, 11:28 AM Pager [phone]  If 7PM-7AM, please contact night-coverage [email or web address] Password [email or web address]

## 2017-01-03 NOTE — H&P (Signed)
History and Physical  Patient Name: Loretta Alexander     RVI:[id number]    DOB: October 21, 1980    DOA: 01/02/2017 PCP: Wilfrid Lund, PA  Patient coming from: Work  Chief Complaint: Leg pain, hives/itching, nausea/vomiting, dyspnea and chest discomfort      HPI: Loretta Alexander is a 36 y.o. female with a past medical history significant for migraines who presents with acute hives, itching, and chest discomfort.  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  She kicked her shoe off when she was out of the room and thought she saw a redness, but saw no insect.  Within about 20 minutes, she started to have red splotches on her arms and chest, generalized itching, nausea, abdominal cramps, chest tightness and dyspnea, so she left work and checked herself into the ER as a patient.  She recalls being told she was "allergic" to beestings as a child, has not been stung since.  She denies other new meds or ingestions.  ED course: -Afebrile, heart rate 120, respirations and pulse ox normal, BP 145/99 -Na 137, K 3.6, Cr 0.69, WBC 10.3K, Hgb 13.2 -ECG sinus tachycardia -She was given epinephrine IM, famotidine/diphenhydramine, Solu-medrol -She was given albuterol, lorazepam, Robaxin, Ondasetron, and IV fluids -She appeared to have minimal improvement with these treatments and TRH were asked to evaluate for anaphylaxis with simmering symptoms (nausea, cramps, chest tightness/dyspnea but also leg pain, lip tingling) despite epinephrine     ROS: Review of Systems  Respiratory: Positive for shortness of breath. Negative for wheezing.   Cardiovascular: Positive for chest pain.  Gastrointestinal: Positive for abdominal pain, nausea and vomiting.  Musculoskeletal:       Right low back pain, radiating to right  lateral leg down to right foot  Neurological: Positive for tingling.  Psychiatric/Behavioral: The patient is nervous/anxious.   All other systems reviewed and are negative.         Past Medical History:  Diagnosis Date  . Complication of anesthesia    Difficult IV stick with Central line placement, TACHY 180-190 S/P COLON SURGERY (resolved with treatment for pain)   . Depression   . Drug-seeking behavior   . Dysrhythmia    INCREASED HR AT TIMES  . Eye infection    within last 2 weeks  . Gastritis 03/2011   Per EGD  . GERD (gastroesophageal reflux disease)    OCC TUMS  . Intestinal obstruction (HCC)   . Migraines   . Obesity   . Ovarian cyst, right    Hx of oophorectomy for adhesions  . Pneumonia    04/2010  . Renal disorder    Kidney Stones  . Thyroid nodule   . UTI (lower urinary tract infection)     Past Surgical History:  Procedure Laterality Date  . ABDOMINAL ADHESION SURGERY  2006   BIL FALLOPIAN TUBES REMOVED  . ANTERIOR CERVICAL DECOMP/DISCECTOMY FUSION N/A 04/28/2015   Procedure: Cervical five-six, Cervical six - seven Anterior cervical decompression/diskectomy/fusion;  Surgeon: Tia Alert, MD;  Location: [facility];  Service: Neurosurgery;  Laterality: N/A;  C5-6 C6-7 Anterior cervical decompression/diskectomy/fusion  . APPENDECTOMY  1992  . CHOLECYSTECTOMY  2003  . COLON SURGERY     MAY 2015  BOWEL OBST s/p partial bowel resection [facility])  . ESOPHAGOGASTRODUODENOSCOPY  04/05/2011   Procedure: ESOPHAGOGASTRODUODENOSCOPY (EGD);  Surgeon: Charna Elizabeth, MD;  Location: [facility];  Service: Endoscopy;  Laterality: N/A;  . HERNIA REPAIR     umbilical  . KNEE ARTHROSCOPY  2001, 2010   left knee x 2  . LAPAROSCOPIC GASTRIC BANDING    . NASAL SEPTOPLASTY W/ TURBINOPLASTY  03/16/2011   Procedure: NASAL SEPTOPLASTY WITH TURBINATE REDUCTION;  Surgeon: Susy Frizzle, MD;  Location: [facility] OR;  Service: ENT;  Laterality: Bilateral;  . PILONIDAL CYST  EXCISION    . removal of lap band    . right ovary removed  2006  . TONSILLECTOMY  1991    Social History: Patient works as a Engineer, civil (consulting).  The patient walks unassisted.  Nonsmoker.  Allergies  Allergen Reactions  . Prochlorperazine Maleate Other (See Comments)    Patient can tolerate phenergan Major anxiety attack  . Dilaudid [Hydromorphone Hcl] Itching    Ok with benadryl  . Compazine [Prochlorperazine Maleate] Anxiety and Other (See Comments)    dizziness  . Haloperidol And Related Anxiety and Other (See Comments)    dizziness  . Reglan [Metoclopramide Hcl] Anxiety and Other (See Comments)    dizziness  . Toradol [Ketorolac Tromethamine] Nausea And Vomiting    Family history: family history includes Diabetes in her maternal grandmother; Heart attack (age of onset: 24) in her father; Heart disease in her maternal grandmother; Stomach cancer in her mother.  Prior to Admission medications   Medication Sig Start Date End Date Taking? Authorizing Provider  DULoxetine (CYMBALTA) 60 MG capsule Take 60 mg by mouth daily.    Yes [provider]  levonorgestrel (MIRENA) 20 MCG/24HR IUD 1 each by Intrauterine route once. 2010   Yes [provider]  metFORMIN (GLUCOPHAGE-XR) 500 MG 24 hr tablet Take 500 mg by mouth daily with breakfast.   Yes [provider]  propranolol ER (INDERAL LA) 80 MG 24 hr capsule Take 1 capsule (80 mg total) by mouth daily. 06/16/16  Yes Jaffe, Adam R, DO  QUEtiapine (SEROQUEL) 50 MG tablet Take 150 mg by mouth at bedtime.   Yes [provider]  albuterol (PROVENTIL HFA;VENTOLIN HFA) 108 (90 Base) MCG/ACT inhaler Inhale 1-2 puffs into the lungs every 6 (six) hours as needed for wheezing or shortness of breath. Patient not taking: Reported on 01/03/2017 10/03/16   Lindalou Hose, MD  chlorproMAZINE (THORAZINE) 10 MG tablet Take 1 tablet (10 mg total) by mouth 3 (three) times daily. Patient not taking: Reported on 01/03/2017 09/05/16   Drema Dallas, DO  dicyclomine (BENTYL) 20 MG tablet Take 1 tablet (20 mg total) by mouth 2 (two) times daily. Patient not taking: Reported on 01/03/2017 06/26/16   Linwood Dibbles, MD  ketoprofen (ORUDIS) 75 MG capsule Take 1 capsule (75 mg total) by mouth 3 (three) times daily as needed. Patient not taking: Reported on 01/03/2017 09/04/16   Drema Dallas, DO  ondansetron (ZOFRAN ODT) 8 MG disintegrating tablet Take 1 tablet (8 mg total) by mouth every 8 (eight) hours as needed for nausea or vomiting. Patient not taking: Reported on 01/03/2017 06/26/16   Linwood Dibbles, MD  prochlorperazine (COMPAZINE) 10 MG tablet Take 1 tablet (10 mg total) by mouth 3 (three) times daily as needed for nausea or vomiting. Patient not taking: Reported on 01/03/2017 09/04/16   Drema Dallas, DO  traMADol-acetaminophen (ULTRACET) 37.5-325 MG tablet Take 1-2 tablets by mouth every 6 (  six) hours as needed. Patient not taking: Reported on 01/03/2017 06/16/16   Drema Dallas, DO       Physical Exam: BP (!) 117/59   Pulse (!) 132   Temp 98.1 F (36.7 C) (Oral)   Resp 15   Ht 5\' 2"  (1.575 m)   Wt 99.8 kg (220 lb)   SpO2 98%   BMI 40.24 kg/m  General appearance: Well-developed, obese adult female, alert and in moderate distress from anxiety.   Eyes: Anicteric, conjunctiva pink, lids and lashes normal. PERRL.    ENT: No nasal deformity, discharge, epistaxis.  Hearing normal. OP moist without lesions.   No angioedema. Neck: No neck masses.  Trachea midline.  No thyromegaly/tenderness. Lymph: No cervical or supraclavicular lymphadenopathy. Skin: Warm and dry.  No jaundice.  No suspicious rashes or lesions.  No evidence of bite on foot (she indicates roughly the lateral 5th MTP joint as the site of her bite/sting):   Cardiac: Tachycardic, regular, nl S1-S2, no murmurs appreciated.  Capillary refill is brisk.  JVP not visible.  No LE edema.  Radial and DP pulses 2+ and symmetric.  No carotid bruits. Respiratory: Normal respiratory rate  and rhythm.  CTAB without rales or wheezes. Abdomen: Abdomen soft.  Mild nonfocal TTP without guarding or reboubd. No ascites, distension, hepatosplenomegaly.   MSK: No deformities or effusions.  No cyanosis or clubbing.  The left foot is normal, the right foot appears normal, but has tenderness to light palpation throughout, as well as tenderness to palpation of the lateral leg (without redness or induration or swelling of skin) all the way up to the hip.  This is worse with movement, but not localized to the knee, hip or even IT band. Neuro: Cranial nerves normal.  Sensation intact to light touch. Speech is fluent.  Muscle strength normal.    Psych: Sensorium intact and responding to questions, attention normal.  Behavior appropriate.  Affect anxious.  Judgment and insight appear normal.     Labs on Admission:  I have personally reviewed following labs and imaging studies: CBC:  Recent Labs Lab 01/02/17 0013  WBC 10.3  NEUTROABS 5.6  HGB 13.2  HCT 39.1  MCV 87.9  PLT 318   Basic Metabolic Panel:  Recent Labs Lab 01/02/17 0013  NA 137  K 3.6  CL 106  CO2 20*  GLUCOSE 222*  BUN 13  CREATININE 0.69  CALCIUM 9.3   GFR: Estimated Creatinine Clearance: 108.5 mL/min (by C-G formula based on SCr of 0.69 mg/dL).  Liver Function Tests: No results for input(s): AST, ALT, ALKPHOS, BILITOT, PROT, ALBUMIN in the last 168 hours. No results for input(s): LIPASE, AMYLASE in the last 168 hours. No results for input(s): AMMONIA in the last 168 hours. Coagulation Profile: No results for input(s): INR, PROTIME in the last 168 hours. Cardiac Enzymes: No results for input(s): CKTOTAL, CKMB, CKMBINDEX, TROPONINI in the last 168 hours. BNP (last 3 results) No results for input(s): PROBNP in the last 8760 hours. HbA1C: No results for input(s): HGBA1C in the last 72 hours. CBG: No results for input(s): GLUCAP in the last 168 hours. Lipid Profile: No results for input(s): CHOL, HDL,  LDLCALC, TRIG, CHOLHDL, LDLDIRECT in the last 72 hours. Thyroid Function Tests: No results for input(s): TSH, T4TOTAL, FREET4, T3FREE, THYROIDAB in the last 72 hours. Anemia Panel: No results for input(s): VITAMINB12, FOLATE, FERRITIN, TIBC, IRON, RETICCTPCT in the last 72 hours. Sepsis Labs: Invalid input(s): PROCALCITONIN, LACTICIDVEN No results found for this  or any previous visit (from the past 240 hour(s)).        EKG: Independently reviewed. ECG shows rate 121, sinus tachycardia.    Assessment/Plan  1. Possible anaphylaxis:  Overall her symptom constellation is nonspecific, there is no evidence for an inciting factor (there was no insect seen, not likely to have been a stinging insect in the ER, and no evidence of a sting on her foot), and her presentation is atypical for anaphylaxis (to me she primarily complains of pain). The symptom set is probably also shared by opiate withdrawal (nausea, generalized skin tingling, pain, tachycardia) or panic attacks (chest tightness, dyspnea, tingling, nausea).  Given patient's employment, she is understandably guarded about the opiate use and denies.   EDP felt patient was having anaphylactic reaction warranting two doses of IM epinephrine. In light of that, although I am somewhat doubtful that she had any type of allergic reaction to anything, I agree it is warranted to observe overnight.  There is no leukocytosis, electrolyte disturbance or evidence of cardiac ischemia that are concerning.  Reviewing her chart suggests the possibility of something like the former DSM-IV dx of somatization disorder, or somatic symptom disorder (frequent somatic complaints, in disparate systems, without objective diagnoses, causing significant distress).   -Observe overnight in stepdown for signs of anaphylaxis -Continue steroid for 24 hours -Could continue famotidine and diphenhydramine for 24-48 hours -Will discharge with two auto-injectors and emergency action  plan (i.e. [email or web address] ) -Patient counseled to get Allergy/Imm referral from PCP at discharge to confirm diagnosis of anaphylaxis   2. Diabetes:  -Continue metformin -SSI ordered  3. Other medications:  -Continue duloxetine -Continue Seroquel -Continue propranolol for migraines      DVT prophylaxis: Lovenox  Code Status: FULL  Family Communication: None present  Disposition Plan: Anticipate IV steroids, continue H1RA and H2RA for 24 hours.  Monitor this morning for resolution of symptoms.  Lorazepam for anxiety.  Avoid further narcotics.  If improving in AM, discharge with auto-injectors and plan for outpatient All/Imm referral. Consults called: None Admission status: OBS At the point of initial evaluation, it is my clinical opinion that admission for OBSERVATION is reasonable and necessary because the patient's presenting complaints in the context of their chronic conditions represent sufficient risk of deterioration or significant morbidity to constitute reasonable grounds for close observation in the hospital setting, but that the patient may be medically stable for discharge from the hospital within 24 to 48 hours.    Medical decision making: Patient seen at 1:20 AM on 01/03/2017.  The patient was discussed with Glenford Bayley, PA-C.  What exists of the patient's chart was reviewed in depth and summarized above.  Clinical condition: stable.        Alberteen Sam [facility] Pager [phone]

## 2017-01-03 NOTE — ED Notes (Signed)
Pt still feels itchy and nauseous at this.

## 2017-01-03 NOTE — Progress Notes (Signed)
Inpatient Diabetes Program Recommendations  AACE/ADA: New Consensus Statement on Inpatient Glycemic Control (2015)  Target Ranges:  Prepandial:   less than 140 mg/dL      Peak postprandial:   less than 180 mg/dL (1-2 hours)      Critically ill patients:  140 - 180 mg/dL   Lab Results  Component Value Date   GLUCAP 310 (H) 01/03/2017    Review of Glycemic Control  Noted A1c 9.2 @ [facility] on 12/18/16. Prior dx of diabetes not noted in records. Will follow.  Thank you, Billy FischerJudy E. Leshaun Biebel, RN, MSN, CDE  Diabetes Coordinator Inpatient Glycemic Control Team Team Pager [phone] (8am-5pm) 01/03/2017 9:35 AM

## 2017-01-03 NOTE — Discharge Instructions (Signed)
Anaphylaxis Emergency Action Plan Patient Name: ____________________________________________________________ Age: _______________ Allergies: ____________________________________________________________________________________ Asthma Yes (high risk for severe reaction) No Additional health problems besides anaphylaxis: ___________________________________________________ _____________________________________________________________________________________________ Concurrent medications: _______________________________________________________________________ _____________________________________________________________________________________________ Symptoms of Anaphylaxis MOUTH itching, swelling of lips and/or tongue THROAT* itching, tightness/closure, hoarseness SKIN itching, hives, redness, swelling GUT vomiting, diarrhea, cramps LUNG* shortness of breath, cough, wheeze HEART* weak pulse, dizziness, passing out Only a few symptoms may be present. Severity of symptoms can change quickly. *Some symptoms can be life-threatening. ACT FAST! Emergency Action Steps - DO NOT HESITATE TO GIVE EPINEPHRINE! 1. Inject epinephrine in thigh using (check one): Adrenaclick (0.15 mg) Adrenaclick (0.3 mg) Auvi-Q (0.15 mg) Auvi-Q (0.3 mg) EpiPen Jr (0.15 mg) EpiPen (0.3 mg) Epinephrine Injection, USP Auto-injector- authorized generic (0.15 mg) (0.3 mg) Other (0.15 mg) Other (0.3 mg) Specify others: ______________________________________________________________________________ IMPORTANT: ASTHMA INHALERS AND/OR ANTIHISTAMINES CANT BE DEPENDED ON IN ANAPHYLAXIS. 2. Call 911 or rescue squad (before calling contact) 3. Emergency contact #1: home__________________ work__________________ cell_________________ Emergency contact #2: home__________________ work__________________ cell_________________ Emergency contact #3: home__________________ work__________________ cell_________________ Comments:  ________________________________________________________________________________ _______________________________________________________________________ _______________________________________________________________________ Doctors Signature/Date/Phone Number __________________________________________________________________________________________ Parents Signature (for individuals under age 47 yrs)/Date This information is for general purposes and is not intended to replace the advice of a qualified health professional. For more information, visit [email or web address].  2017 American Academy of Allergy, Asthma & Immunology 08/2015  Follow with Primary MD  Wilfrid Lund, PA  and other consultant's as instructed your Hospitalist MD  Please get a complete blood count and chemistry panel checked by your Primary MD at your next visit, and again as instructed by your Primary MD.  Get Medicines reviewed and adjusted: Please take all your medications with you for your next visit with your Primary MD  Laboratory/radiological data: Please request your Primary MD to go over all hospital tests and procedure/radiological results at the follow up, please ask your Primary MD to get all Hospital records sent to his/her office.  In some cases, they will be blood work, cultures and biopsy results pending at the time of your discharge. Please request that your primary care M.D. follows up on these results.  Also Note the following: If you experience worsening of your admission symptoms, develop shortness of breath, life threatening emergency, suicidal or homicidal thoughts you must seek medical attention immediately by calling 911 or calling your MD immediately  if symptoms less severe.  You must read complete instructions/literature along with all the possible adverse reactions/side effects for all the Medicines you take and that have been prescribed to you. Take any new Medicines after you have completely  understood and accpet all the possible adverse reactions/side effects.   Do not drive when taking Pain medications or sleeping medications (Benzodaizepines)  Do not take more than prescribed Pain, Sleep and Anxiety Medications. It is not advisable to combine anxiety,sleep and pain medications without talking with your primary care practitioner  Special Instructions: If you have smoked or chewed Tobacco  in the last 2 yrs please stop smoking, stop any regular Alcohol  and or any Recreational drug use.  Wear Seat belts while driving.  Please note: You were cared for by a hospitalist during your hospital stay. Once you are discharged, your primary care physician will handle any further medical issues. Please note that NO REFILLS for any discharge medications will be authorized once you are discharged, as it is imperative that you return to your primary care physician (or establish a relationship with a primary care physician if you do not  have one) for your post hospital discharge needs so that they can reassess your need for medications and monitor your lab values.

## 2017-01-04 DIAGNOSIS — M79631 Pain in right forearm: Secondary | ICD-10-CM | POA: Diagnosis not present

## 2017-01-04 DIAGNOSIS — R000 Tachycardia, unspecified: Secondary | ICD-10-CM | POA: Diagnosis not present

## 2017-01-04 DIAGNOSIS — R112 Nausea with vomiting, unspecified: Secondary | ICD-10-CM | POA: Diagnosis not present

## 2017-01-04 DIAGNOSIS — M7989 Other specified soft tissue disorders: Secondary | ICD-10-CM | POA: Diagnosis not present

## 2017-01-04 DIAGNOSIS — G629 Polyneuropathy, unspecified: Secondary | ICD-10-CM | POA: Diagnosis not present

## 2017-01-04 DIAGNOSIS — I517 Cardiomegaly: Secondary | ICD-10-CM | POA: Diagnosis not present

## 2017-01-04 DIAGNOSIS — R0602 Shortness of breath: Secondary | ICD-10-CM | POA: Diagnosis not present

## 2017-01-04 DIAGNOSIS — F419 Anxiety disorder, unspecified: Secondary | ICD-10-CM | POA: Diagnosis not present

## 2017-01-04 DIAGNOSIS — R9431 Abnormal electrocardiogram [ECG] [EKG]: Secondary | ICD-10-CM | POA: Diagnosis not present

## 2017-01-04 DIAGNOSIS — R0789 Other chest pain: Secondary | ICD-10-CM | POA: Diagnosis not present

## 2017-01-04 DIAGNOSIS — L299 Pruritus, unspecified: Secondary | ICD-10-CM | POA: Diagnosis not present

## 2017-01-05 DIAGNOSIS — G43009 Migraine without aura, not intractable, without status migrainosus: Secondary | ICD-10-CM | POA: Diagnosis not present

## 2017-01-05 DIAGNOSIS — T782XXD Anaphylactic shock, unspecified, subsequent encounter: Secondary | ICD-10-CM | POA: Diagnosis not present

## 2017-01-05 DIAGNOSIS — E1165 Type 2 diabetes mellitus with hyperglycemia: Secondary | ICD-10-CM | POA: Diagnosis not present

## 2017-01-05 DIAGNOSIS — R413 Other amnesia: Secondary | ICD-10-CM | POA: Diagnosis not present

## 2017-01-05 DIAGNOSIS — F329 Major depressive disorder, single episode, unspecified: Secondary | ICD-10-CM | POA: Diagnosis not present

## 2017-01-05 DIAGNOSIS — R112 Nausea with vomiting, unspecified: Secondary | ICD-10-CM | POA: Diagnosis not present

## 2017-01-05 DIAGNOSIS — R21 Rash and other nonspecific skin eruption: Secondary | ICD-10-CM | POA: Diagnosis not present

## 2017-01-05 DIAGNOSIS — K660 Peritoneal adhesions (postprocedural) (postinfection): Secondary | ICD-10-CM | POA: Diagnosis not present

## 2017-01-05 MED FILL — QUETIAPINE FUMARATE 50 MG T: 50 | 90 days supply | Qty: 270 | Fill #0

## 2017-01-05 MED FILL — DULoxetine HCL 60 MG CPEP: 60 | 90 days supply | Qty: 90 | Fill #0

## 2017-01-05 MED FILL — PROPRANOLOL ER 80 MG CAP: 80 | 30 days supply | Qty: 30 | Fill #0

## 2017-01-10 ENCOUNTER — Encounter: Payer: Self-pay | Admitting: Gastroenterology

## 2017-02-01 ENCOUNTER — Ambulatory Visit: Payer: 59 | Admitting: Allergy and Immunology

## 2017-02-06 DIAGNOSIS — N3091 Cystitis, unspecified with hematuria: Secondary | ICD-10-CM | POA: Diagnosis not present

## 2017-02-06 DIAGNOSIS — R109 Unspecified abdominal pain: Secondary | ICD-10-CM | POA: Diagnosis not present

## 2017-02-06 MED FILL — tiZANidine HCL 4 MG TABS: 4 | 30 days supply | Qty: 90 | Fill #0

## 2017-02-19 ENCOUNTER — Encounter: Payer: Self-pay | Admitting: Allergy and Immunology

## 2017-02-26 MED FILL — METFORMIN HCL ER 500 MG TAB: 500 | 90 days supply | Qty: 180 | Fill #0

## 2017-03-01 ENCOUNTER — Ambulatory Visit: Payer: 59 | Admitting: Neurology

## 2017-03-07 MED FILL — tiZANidine HCL 4 MG TABS: 4 | 30 days supply | Qty: 90 | Fill #0

## 2017-03-08 DIAGNOSIS — Z9889 Other specified postprocedural states: Secondary | ICD-10-CM | POA: Diagnosis not present

## 2017-03-08 DIAGNOSIS — R319 Hematuria, unspecified: Secondary | ICD-10-CM | POA: Diagnosis not present

## 2017-03-08 DIAGNOSIS — E860 Dehydration: Secondary | ICD-10-CM | POA: Diagnosis not present

## 2017-03-08 DIAGNOSIS — K529 Noninfective gastroenteritis and colitis, unspecified: Secondary | ICD-10-CM | POA: Diagnosis not present

## 2017-03-08 DIAGNOSIS — R197 Diarrhea, unspecified: Secondary | ICD-10-CM | POA: Diagnosis not present

## 2017-03-08 DIAGNOSIS — R7303 Prediabetes: Secondary | ICD-10-CM | POA: Diagnosis not present

## 2017-03-08 DIAGNOSIS — R1110 Vomiting, unspecified: Secondary | ICD-10-CM | POA: Diagnosis not present

## 2017-03-08 DIAGNOSIS — R112 Nausea with vomiting, unspecified: Secondary | ICD-10-CM | POA: Diagnosis not present

## 2017-03-08 DIAGNOSIS — R002 Palpitations: Secondary | ICD-10-CM | POA: Diagnosis not present

## 2017-03-08 DIAGNOSIS — R509 Fever, unspecified: Secondary | ICD-10-CM | POA: Diagnosis not present

## 2017-03-08 DIAGNOSIS — R109 Unspecified abdominal pain: Secondary | ICD-10-CM | POA: Diagnosis not present

## 2017-03-08 DIAGNOSIS — R1033 Periumbilical pain: Secondary | ICD-10-CM | POA: Diagnosis not present

## 2017-03-09 ENCOUNTER — Other Ambulatory Visit: Payer: Self-pay | Admitting: Student

## 2017-03-09 DIAGNOSIS — M5412 Radiculopathy, cervical region: Secondary | ICD-10-CM

## 2017-03-19 ENCOUNTER — Encounter: Payer: Self-pay | Admitting: Gastroenterology

## 2017-03-19 ENCOUNTER — Other Ambulatory Visit: Payer: 59

## 2017-03-19 ENCOUNTER — Ambulatory Visit: Payer: 59 | Admitting: Gastroenterology

## 2017-03-19 VITALS — BP 132/92 | HR 112 | Ht 61.25 in | Wt 235.1 lb

## 2017-03-19 DIAGNOSIS — K219 Gastro-esophageal reflux disease without esophagitis: Secondary | ICD-10-CM

## 2017-03-19 DIAGNOSIS — R109 Unspecified abdominal pain: Secondary | ICD-10-CM

## 2017-03-19 DIAGNOSIS — R194 Change in bowel habit: Secondary | ICD-10-CM | POA: Diagnosis not present

## 2017-03-19 DIAGNOSIS — R112 Nausea with vomiting, unspecified: Secondary | ICD-10-CM | POA: Diagnosis not present

## 2017-03-19 DIAGNOSIS — Z8719 Personal history of other diseases of the digestive system: Secondary | ICD-10-CM

## 2017-03-19 MED ORDER — PANTOPRAZOLE SODIUM 40 MG PO TBEC: 40.0000 mg | DELAYED_RELEASE_TABLET | Freq: Every day | ORAL | 1 refills | Status: DC

## 2017-03-19 MED ORDER — PROMETHAZINE HCL 12.5 MG PO TABS: 12.5000 mg | ORAL_TABLET | Freq: Four times a day (QID) | ORAL | 1 refills | Status: AC | PRN

## 2017-03-19 MED FILL — PANTOPRAZOLE SOD DR 40 MG T: 40 | 45 days supply | Qty: 90 | Fill #0

## 2017-03-19 MED FILL — PROMETHAZINE 12.5 MG TABLET: 12.5 | 8 days supply | Qty: 30 | Fill #0

## 2017-03-19 NOTE — H&P (View-Only) (Signed)
HPI :  36 y/o female with a history of GERD, small bowel obstructions due to adhesions, DM, referredhere by Horton MarshallAnna Becker PA for a new patient visit regarding multiple bowel symptoms.   She reports a remote history of cholecystectomy and appendectomy. She states she has had multiple small bowel obstructions in the past. She required hospitalization and surgery removed portion of her small intestine and colon in 2015. She states her last admission for an obstruction was in August 2017 at which time she responded to conservative therapy, no operation required.   She reports she has been having sporadic nausea and vomiting intermittently for a long time. She states she is nauseated frequently and vomits about 4 times per week. She endorses early satiety and worsening nausea after she eats. She denies any weight loss. She uses Zofran as needed although this doesn't help much at all, also using Pepcid. She recently started low-dose Protonix, not sure if this helped much. She endorses abdominal pain in her mid abdomen which radiates to her lower abdomen abdomen at times. She states this can often be relieved with vomiting, and is preceded by eating. She was seen in the ER on November 1 for these symptoms, and there was no obvious evidence of bowel obstruction, other findings of some free fluid in the pelvis questioned a ruptured ovarian cyst. Prior CT scan in February showed a small bowel anastomosis with air-fluid levels.  She states she has reflux which is bothering her. She denies any dysphagia. She otherwise endorses changes in her bowel habits, having a bowel movement about 5 times per day, she thinks she sees mucus, not sure if she may see blood or not. She reports her bowel changes have been going on for 5-6 weeks. She previously was constipated prior to change in bowel habits, and endorses some low-grade temperatures at night. 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 Her mother was  recently diagnosed with esophageal and gastric cancer at the age of 36.   On review of systems she endorses periodic headaches, as well as occasionally developing hives, of unclear etiology, also going on for several months. Her aunt has celiac disease.  Prior workup: CT 03/08/17 - [facility] - free fluid in pelvis, ? Ruptured ovarian cyst,  CT abdomen / pelvis - 06/16/2016 - left adnexal cyst, small bowel anastomosis with air fluid levels EGD 04/06/2011 - mild esophagitis   Past Medical History:  Diagnosis Date  . Anxiety   . Complication of anesthesia    Difficult IV stick with Central line placement, TACHY 180-190 S/P COLON SURGERY (resolved with treatment for pain)   . Depression   . Drug-seeking behavior   . Dysrhythmia    INCREASED HR AT TIMES  . Esophagitis determined by endoscopy    03-2011 EGD  . Eye infection    within last 2 weeks  . Gastritis 03/2011   Per EGD  . GERD (gastroesophageal reflux disease)    OCC TUMS  . Intestinal obstruction (HCC)   . Kidney stones    Kidney Stones  . Migraines   . Obesity   . Ovarian cyst, right    Hx of oophorectomy for adhesions  . Pneumonia    04/2010  . Thyroid nodule   . Type 2 diabetes mellitus (HCC) 01/03/2017  . UTI (lower urinary tract infection)      Past Surgical History:  Procedure Laterality Date  . ABDOMINAL ADHESION SURGERY  2006   BIL FALLOPIAN  TUBES REMOVED  . APPENDECTOMY  1992  . CHOLECYSTECTOMY  2003  . COLON SURGERY     MAY 2015     BOWEL OBST s/p partial bowel resection [facility])  . KNEE ARTHROSCOPY  2001, 2010   left knee x 2  . LAPAROSCOPIC GASTRIC BANDING    . PILONIDAL CYST EXCISION    . removal of lap band    . right ovary removed  2006  . TONSILLECTOMY  1991  . UMBILICAL HERNIA REPAIR     Family History  Problem Relation Age of Onset  . Stomach cancer Mother   . Esophageal cancer Mother   . Colon polyps Mother   . Heart attack Father 61  . Diabetes Maternal Grandmother   .  Heart disease Maternal Grandmother   . Celiac disease Paternal Aunt    Social History   Tobacco Use  . Smoking status: Never Smoker  . Smokeless tobacco: Never Used  Substance Use Topics  . Alcohol use: Yes    Comment: rarely  . Drug use: No   Current Outpatient Medications  Medication Sig Dispense Refill  . Cetirizine HCl 10 MG CAPS Take 1 capsule (10 mg total) by mouth daily. 30 capsule 0  . DULoxetine (CYMBALTA) 60 MG capsule Take 60 mg by mouth daily.     . famotidine (PEPCID) 20 MG tablet Take 1 tablet (20 mg total) by mouth 2 (two) times daily. 60 tablet 0  . levonorgestrel (MIRENA) 20 MCG/24HR IUD 1 each by Intrauterine route once. 2010    . metFORMIN (GLUCOPHAGE-XR) 500 MG 24 hr tablet Take 2 tablets (1,000 mg total) by mouth 2 (two) times daily with a meal. 120 tablet 0  . propranolol ER (INDERAL LA) 80 MG 24 hr capsule Take 1 capsule (80 mg total) by mouth daily. 30 capsule 2  . QUEtiapine (SEROQUEL) 50 MG tablet Take 150 mg by mouth at bedtime.    Marland Kitchen tiZANidine (ZANAFLEX) 4 MG tablet Take 1 tablet 3 (three) times daily as needed by mouth.  0  . EPIPEN 2-PAK 0.3 MG/0.3ML SOAJ injection as needed.  1  . ondansetron (ZOFRAN-ODT) 4 MG disintegrating tablet Take 4 mg every 8 (eight) hours as needed by mouth. for nausea  0  . pantoprazole (PROTONIX) 40 MG tablet Take 1 tablet (40 mg total) daily by mouth. Can increase to twice a day as needed 90 tablet 1  . promethazine (PHENERGAN) 12.5 MG tablet Take 1 tablet (12.5 mg total) every 6 (six) hours as needed by mouth for nausea or vomiting. 30 tablet 1   No current facility-administered medications for this visit.    Allergies  Allergen Reactions  . Prochlorperazine Maleate Other (See Comments)    Patient can tolerate phenergan Major anxiety attack  . Dilaudid [Hydromorphone Hcl] Itching    Ok with benadryl  . Compazine [Prochlorperazine Maleate] Anxiety and Other (See Comments)    dizziness  . Haloperidol And Related Anxiety  and Other (See Comments)    dizziness  . Reglan [Metoclopramide Hcl] Anxiety and Other (See Comments)    dizziness  . Toradol [Ketorolac Tromethamine] Nausea And Vomiting     Review of Systems: All systems reviewed and negative except where noted in HPI.   Lab Results  Component Value Date   WBC 10.3 01/02/2017   HGB 13.2 01/02/2017   HCT 39.1 01/02/2017   MCV 87.9 01/02/2017   PLT 318 01/02/2017    Lab Results  Component Value Date   CREATININE 0.69  01/02/2017   BUN 13 01/02/2017   NA 137 01/02/2017   K 3.6 01/02/2017   CL 106 01/02/2017   CO2 20 (L) 01/02/2017    Lab Results  Component Value Date   ALT 39 06/26/2016   AST 30 06/26/2016   ALKPHOS 70 06/26/2016   BILITOT 0.6 06/26/2016     Physical Exam: BP (!) 132/92 (BP Location: Left Arm, Patient Position: Sitting, Cuff Size: Normal)   Pulse (!) 112   Ht 5' 1.25" (1.556 m) Comment: height measured without shoes  Wt 235 lb 2 oz (106.7 kg)   BMI 44.06 kg/m  Constitutional: Pleasant,well-developed, female in no acute distress. HEENT: Normocephalic and atraumatic. Conjunctivae are normal. No scleral icterus. Neck supple.  Cardiovascular: Normal rate, regular rhythm.  Pulmonary/chest: Effort normal and breath sounds normal. No wheezing, rales or rhonchi. Abdominal: Soft, nondistended, nontender. . There are no masses palpable. No hepatomegaly. Extremities: no edema Lymphadenopathy: No cervical adenopathy noted. Neurological: Alert and oriented to person place and time. Skin: Skin is warm and dry. No rashes noted. Psychiatric: Normal mood and affect. Behavior is normal.   ASSESSMENT AND PLAN: 36 year old female with a history of prior bowel obstructions reportedly due to adhesive disease, here for new patient visit regarding the following issues:  Nausea / vomiting / abdominal pain - the question is given her history of obstructions, she having intermittent obstructive symptoms. Prior CT scan of February  suggested air fluid levels at the surgical anastomosis, recent CT scan did not show this or any bowel wall thickening / stenosis. Her last EGD was several years ago, she also has a history of diabetes and gastroparesis is possible as she also has early satiety. I think an EGD to start is reasonable given her worsening symptoms. If this is negative we'll proceed with gastric emptying study. We'll plan on testing for celiac disease with EGD. In the interim we'll increase her Protonix to 40 mg once daily to twice daily as needed for reflux. We will switch Zofran to Phenergan as needed for nausea. I otherwise recommend a low residual diet in light of her history of obstructions. If EGD and gastric emptying study is negative, may consider a follow-up MR enterography. She agreed with the plan. Of note, as below, we will wait for stool study to be negative for infection prior to scheduling EGD. I discussed risks and benefits of EGD and she wanted to proceed when able to.  Change in bowel habits / diarrhea - given her multiple sick contacts, will send GI pathogen panel to rule out C. difficile and other infectious etiologies. If this is negative given her dramatic change in symptoms may proceed with colonoscopy, rule out colitis. She agreed with the plan.  Ileene PatrickSteven Armbruster, MD [facility] Pager [phone]  CC: Wilfrid LundBecker, Anna G, [place]

## 2017-03-19 NOTE — Patient Instructions (Addendum)
If you are age 36 or older, your body mass index should be between 23-30. Your Body mass index is 44.06 kg/m. If this is out of the aforementioned range listed, please consider follow up with your Primary Care Provider.  If you are age 36 or younger, your body mass index should be between 19-25. Your Body mass index is 44.06 kg/m. If this is out of the aformentioned range listed, please consider follow up with your Primary Care Provider.   Your physician has requested that you go to the basement for the following lab work before leaving today: GI Pathogen panel  We have sent the following medications to your pharmacy for you to pick up at your convenience: Protonix, 40mg   Phenergan, 12.5mg   We have given you a Low Residue diet to follow.  Thank you.low

## 2017-03-19 NOTE — Progress Notes (Signed)
HPI :  36 y/o female with a history of GERD, small bowel obstructions due to adhesions, DM, referredhere by Horton MarshallAnna Becker PA for a new patient visit regarding multiple bowel symptoms.   She reports a remote history of cholecystectomy and appendectomy. She states she has had multiple small bowel obstructions in the past. She required hospitalization and surgery removed portion of her small intestine and colon in 2015. She states her last admission for an obstruction was in August 2017 at which time she responded to conservative therapy, no operation required.   She reports she has been having sporadic nausea and vomiting intermittently for a long time. She states she is nauseated frequently and vomits about 4 times per week. She endorses early satiety and worsening nausea after she eats. She denies any weight loss. She uses Zofran as needed although this doesn't help much at all, also using Pepcid. She recently started low-dose Protonix, not sure if this helped much. She endorses abdominal pain in her mid abdomen which radiates to her lower abdomen abdomen at times. She states this can often be relieved with vomiting, and is preceded by eating. She was seen in the ER on November 1 for these symptoms, and there was no obvious evidence of bowel obstruction, other findings of some free fluid in the pelvis questioned a ruptured ovarian cyst. Prior CT scan in February showed a small bowel anastomosis with air-fluid levels.  She states she has reflux which is bothering her. She denies any dysphagia. She otherwise endorses changes in her bowel habits, having a bowel movement about 5 times per day, she thinks she sees mucus, not sure if she may see blood or not. She reports her bowel changes have been going on for 5-6 weeks. She previously was constipated prior to change in bowel habits, and endorses some low-grade temperatures at night. 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 Her mother was  recently diagnosed with esophageal and gastric cancer at the age of 36.   On review of systems she endorses periodic headaches, as well as occasionally developing hives, of unclear etiology, also going on for several months. Her aunt has celiac disease.  Prior workup: CT 03/08/17 - [facility] - free fluid in pelvis, ? Ruptured ovarian cyst,  CT abdomen / pelvis - 06/16/2016 - left adnexal cyst, small bowel anastomosis with air fluid levels EGD 04/06/2011 - mild esophagitis   Past Medical History:  Diagnosis Date  . Anxiety   . Complication of anesthesia    Difficult IV stick with Central line placement, TACHY 180-190 S/P COLON SURGERY (resolved with treatment for pain)   . Depression   . Drug-seeking behavior   . Dysrhythmia    INCREASED HR AT TIMES  . Esophagitis determined by endoscopy    03-2011 EGD  . Eye infection    within last 2 weeks  . Gastritis 03/2011   Per EGD  . GERD (gastroesophageal reflux disease)    OCC TUMS  . Intestinal obstruction (HCC)   . Kidney stones    Kidney Stones  . Migraines   . Obesity   . Ovarian cyst, right    Hx of oophorectomy for adhesions  . Pneumonia    04/2010  . Thyroid nodule   . Type 2 diabetes mellitus (HCC) 01/03/2017  . UTI (lower urinary tract infection)      Past Surgical History:  Procedure Laterality Date  . ABDOMINAL ADHESION SURGERY  2006   BIL FALLOPIAN  TUBES REMOVED  . APPENDECTOMY  1992  . CHOLECYSTECTOMY  2003  . COLON SURGERY     MAY 2015     BOWEL OBST s/p partial bowel resection [facility])  . KNEE ARTHROSCOPY  2001, 2010   left knee x 2  . LAPAROSCOPIC GASTRIC BANDING    . PILONIDAL CYST EXCISION    . removal of lap band    . right ovary removed  2006  . TONSILLECTOMY  1991  . UMBILICAL HERNIA REPAIR     Family History  Problem Relation Age of Onset  . Stomach cancer Mother   . Esophageal cancer Mother   . Colon polyps Mother   . Heart attack Father 61  . Diabetes Maternal Grandmother   .  Heart disease Maternal Grandmother   . Celiac disease Paternal Aunt    Social History   Tobacco Use  . Smoking status: Never Smoker  . Smokeless tobacco: Never Used  Substance Use Topics  . Alcohol use: Yes    Comment: rarely  . Drug use: No   Current Outpatient Medications  Medication Sig Dispense Refill  . Cetirizine HCl 10 MG CAPS Take 1 capsule (10 mg total) by mouth daily. 30 capsule 0  . DULoxetine (CYMBALTA) 60 MG capsule Take 60 mg by mouth daily.     . famotidine (PEPCID) 20 MG tablet Take 1 tablet (20 mg total) by mouth 2 (two) times daily. 60 tablet 0  . levonorgestrel (MIRENA) 20 MCG/24HR IUD 1 each by Intrauterine route once. 2010    . metFORMIN (GLUCOPHAGE-XR) 500 MG 24 hr tablet Take 2 tablets (1,000 mg total) by mouth 2 (two) times daily with a meal. 120 tablet 0  . propranolol ER (INDERAL LA) 80 MG 24 hr capsule Take 1 capsule (80 mg total) by mouth daily. 30 capsule 2  . QUEtiapine (SEROQUEL) 50 MG tablet Take 150 mg by mouth at bedtime.    Marland Kitchen tiZANidine (ZANAFLEX) 4 MG tablet Take 1 tablet 3 (three) times daily as needed by mouth.  0  . EPIPEN 2-PAK 0.3 MG/0.3ML SOAJ injection as needed.  1  . ondansetron (ZOFRAN-ODT) 4 MG disintegrating tablet Take 4 mg every 8 (eight) hours as needed by mouth. for nausea  0  . pantoprazole (PROTONIX) 40 MG tablet Take 1 tablet (40 mg total) daily by mouth. Can increase to twice a day as needed 90 tablet 1  . promethazine (PHENERGAN) 12.5 MG tablet Take 1 tablet (12.5 mg total) every 6 (six) hours as needed by mouth for nausea or vomiting. 30 tablet 1   No current facility-administered medications for this visit.    Allergies  Allergen Reactions  . Prochlorperazine Maleate Other (See Comments)    Patient can tolerate phenergan Major anxiety attack  . Dilaudid [Hydromorphone Hcl] Itching    Ok with benadryl  . Compazine [Prochlorperazine Maleate] Anxiety and Other (See Comments)    dizziness  . Haloperidol And Related Anxiety  and Other (See Comments)    dizziness  . Reglan [Metoclopramide Hcl] Anxiety and Other (See Comments)    dizziness  . Toradol [Ketorolac Tromethamine] Nausea And Vomiting     Review of Systems: All systems reviewed and negative except where noted in HPI.   Lab Results  Component Value Date   WBC 10.3 01/02/2017   HGB 13.2 01/02/2017   HCT 39.1 01/02/2017   MCV 87.9 01/02/2017   PLT 318 01/02/2017    Lab Results  Component Value Date   CREATININE 0.69  01/02/2017   BUN 13 01/02/2017   NA 137 01/02/2017   K 3.6 01/02/2017   CL 106 01/02/2017   CO2 20 (L) 01/02/2017    Lab Results  Component Value Date   ALT 39 06/26/2016   AST 30 06/26/2016   ALKPHOS 70 06/26/2016   BILITOT 0.6 06/26/2016     Physical Exam: BP (!) 132/92 (BP Location: Left Arm, Patient Position: Sitting, Cuff Size: Normal)   Pulse (!) 112   Ht 5' 1.25" (1.556 m) Comment: height measured without shoes  Wt 235 lb 2 oz (106.7 kg)   BMI 44.06 kg/m  Constitutional: Pleasant,well-developed, female in no acute distress. HEENT: Normocephalic and atraumatic. Conjunctivae are normal. No scleral icterus. Neck supple.  Cardiovascular: Normal rate, regular rhythm.  Pulmonary/chest: Effort normal and breath sounds normal. No wheezing, rales or rhonchi. Abdominal: Soft, nondistended, nontender. . There are no masses palpable. No hepatomegaly. Extremities: no edema Lymphadenopathy: No cervical adenopathy noted. Neurological: Alert and oriented to person place and time. Skin: Skin is warm and dry. No rashes noted. Psychiatric: Normal mood and affect. Behavior is normal.   ASSESSMENT AND PLAN: 36 year old female with a history of prior bowel obstructions reportedly due to adhesive disease, here for new patient visit regarding the following issues:  Nausea / vomiting / abdominal pain - the question is given her history of obstructions, she having intermittent obstructive symptoms. Prior CT scan of February  suggested air fluid levels at the surgical anastomosis, recent CT scan did not show this or any bowel wall thickening / stenosis. Her last EGD was several years ago, she also has a history of diabetes and gastroparesis is possible as she also has early satiety. I think an EGD to start is reasonable given her worsening symptoms. If this is negative we'll proceed with gastric emptying study. We'll plan on testing for celiac disease with EGD. In the interim we'll increase her Protonix to 40 mg once daily to twice daily as needed for reflux. We will switch Zofran to Phenergan as needed for nausea. I otherwise recommend a low residual diet in light of her history of obstructions. If EGD and gastric emptying study is negative, may consider a follow-up MR enterography. She agreed with the plan. Of note, as below, we will wait for stool study to be negative for infection prior to scheduling EGD. I discussed risks and benefits of EGD and she wanted to proceed when able to.  Change in bowel habits / diarrhea - given her multiple sick contacts, will send GI pathogen panel to rule out C. difficile and other infectious etiologies. If this is negative given her dramatic change in symptoms may proceed with colonoscopy, rule out colitis. She agreed with the plan.  Ileene PatrickSteven Armbruster, MD [facility] Pager [phone]  CC: Wilfrid LundBecker, Anna G, [place]

## 2017-03-20 ENCOUNTER — Other Ambulatory Visit: Payer: Self-pay

## 2017-03-21 ENCOUNTER — Other Ambulatory Visit: Payer: 59

## 2017-03-21 DIAGNOSIS — Z8719 Personal history of other diseases of the digestive system: Secondary | ICD-10-CM

## 2017-03-21 DIAGNOSIS — R112 Nausea with vomiting, unspecified: Secondary | ICD-10-CM | POA: Diagnosis not present

## 2017-03-22 LAB — GASTROINTESTINAL PATHOGEN PANEL PCR
C. DIFFICILE TOX A/B, PCR: NOT DETECTED
Campylobacter, PCR: NOT DETECTED
Cryptosporidium, PCR: NOT DETECTED
E COLI (ETEC) LT/ST, PCR: NOT DETECTED
E COLI (STEC) STX1/STX2, PCR: NOT DETECTED
E coli 0157, PCR: NOT DETECTED
Giardia lamblia, PCR: NOT DETECTED
Norovirus, PCR: NOT DETECTED
ROTAVIRUS, PCR: NOT DETECTED
SALMONELLA, PCR: NOT DETECTED
Shigella, PCR: NOT DETECTED

## 2017-03-23 ENCOUNTER — Other Ambulatory Visit: Payer: Self-pay

## 2017-03-23 MED ORDER — NA SULFATE-K SULFATE-MG SULF 17.5-3.13-1.6 GM/177ML PO SOLN: 1.0000 | ORAL | 0 refills | Status: DC

## 2017-03-27 ENCOUNTER — Other Ambulatory Visit: Payer: Self-pay | Admitting: Student

## 2017-03-27 ENCOUNTER — Other Ambulatory Visit: Payer: 59

## 2017-03-27 ENCOUNTER — Ambulatory Visit
Admission: RE | Admit: 2017-03-27 | Discharge: 2017-03-27 | Disposition: A | Discharge status: Home / Self Care | Payer: 59 | Source: Ambulatory Visit | Attending: Student | Admitting: Student

## 2017-03-27 DIAGNOSIS — M50221 Other cervical disc displacement at C4-C5 level: Secondary | ICD-10-CM | POA: Diagnosis not present

## 2017-03-27 DIAGNOSIS — M5412 Radiculopathy, cervical region: Secondary | ICD-10-CM

## 2017-04-03 MED FILL — QUETIAPINE FUMARATE 50 MG T: 50 | 90 days supply | Qty: 270 | Fill #1

## 2017-04-03 MED FILL — DULoxetine HCL 60 MG CPEP: 60 | 90 days supply | Qty: 90 | Fill #1

## 2017-04-03 MED FILL — tiZANidine HCL 4 MG TABS: 4 | 30 days supply | Qty: 90 | Fill #0

## 2017-04-09 ENCOUNTER — Telehealth: Payer: Self-pay | Admitting: Gastroenterology

## 2017-04-09 NOTE — Telephone Encounter (Signed)
Called patient back, she is in the car on the way to the ED. She is having worsening and uncontrollable N/V and diarrhea.

## 2017-04-09 NOTE — Telephone Encounter (Signed)
Sorry to hear this. Her prior GI pathogen panel was negative. Currently scheduled for EGD / colonoscopy in about a month. Pending her ER course, if she can't wait that long, we can try to split up her procedures and get them done sooner. We can give zofran for nausea and immodium for diarrhea in the interim, await ER course, not sure if she will get imaging there

## 2017-04-13 ENCOUNTER — Emergency Department (HOSPITAL_COMMUNITY): Payer: 59

## 2017-04-13 ENCOUNTER — Observation Stay (HOSPITAL_COMMUNITY)
Admission: EM | Admit: 2017-04-13 | Discharge: 2017-04-14 | Disposition: A | Discharge status: Home / Self Care | Payer: 59 | Attending: Internal Medicine | Admitting: Internal Medicine

## 2017-04-13 ENCOUNTER — Encounter (HOSPITAL_COMMUNITY)
Admission: EM | Disposition: A | Discharge status: Home / Self Care | Payer: Self-pay | Source: Home / Self Care | Attending: Emergency Medicine

## 2017-04-13 ENCOUNTER — Other Ambulatory Visit: Payer: Self-pay

## 2017-04-13 ENCOUNTER — Observation Stay (HOSPITAL_COMMUNITY): Payer: 59

## 2017-04-13 ENCOUNTER — Encounter (HOSPITAL_COMMUNITY): Payer: Self-pay

## 2017-04-13 ENCOUNTER — Encounter: Payer: 59 | Admitting: Gastroenterology

## 2017-04-13 DIAGNOSIS — K2990 Gastroduodenitis, unspecified, without bleeding: Secondary | ICD-10-CM | POA: Diagnosis not present

## 2017-04-13 DIAGNOSIS — R1084 Generalized abdominal pain: Secondary | ICD-10-CM

## 2017-04-13 DIAGNOSIS — K920 Hematemesis: Principal | ICD-10-CM | POA: Diagnosis present

## 2017-04-13 DIAGNOSIS — Z800 Family history of malignant neoplasm of digestive organs: Secondary | ICD-10-CM | POA: Insufficient documentation

## 2017-04-13 DIAGNOSIS — Z9049 Acquired absence of other specified parts of digestive tract: Secondary | ICD-10-CM | POA: Diagnosis not present

## 2017-04-13 DIAGNOSIS — K560 Paralytic ileus: Secondary | ICD-10-CM | POA: Diagnosis not present

## 2017-04-13 DIAGNOSIS — R109 Unspecified abdominal pain: Secondary | ICD-10-CM | POA: Diagnosis present

## 2017-04-13 DIAGNOSIS — E669 Obesity, unspecified: Secondary | ICD-10-CM | POA: Diagnosis present

## 2017-04-13 DIAGNOSIS — F32A Depression, unspecified: Secondary | ICD-10-CM | POA: Diagnosis present

## 2017-04-13 DIAGNOSIS — K567 Ileus, unspecified: Secondary | ICD-10-CM | POA: Diagnosis not present

## 2017-04-13 DIAGNOSIS — Z8371 Family history of colonic polyps: Secondary | ICD-10-CM | POA: Diagnosis not present

## 2017-04-13 DIAGNOSIS — F419 Anxiety disorder, unspecified: Secondary | ICD-10-CM | POA: Diagnosis not present

## 2017-04-13 DIAGNOSIS — Z794 Long term (current) use of insulin: Secondary | ICD-10-CM | POA: Insufficient documentation

## 2017-04-13 DIAGNOSIS — R197 Diarrhea, unspecified: Secondary | ICD-10-CM | POA: Diagnosis not present

## 2017-04-13 DIAGNOSIS — F329 Major depressive disorder, single episode, unspecified: Secondary | ICD-10-CM | POA: Insufficient documentation

## 2017-04-13 DIAGNOSIS — K2970 Gastritis, unspecified, without bleeding: Secondary | ICD-10-CM

## 2017-04-13 DIAGNOSIS — K226 Gastro-esophageal laceration-hemorrhage syndrome: Secondary | ICD-10-CM | POA: Diagnosis not present

## 2017-04-13 DIAGNOSIS — E119 Type 2 diabetes mellitus without complications: Secondary | ICD-10-CM

## 2017-04-13 DIAGNOSIS — R112 Nausea with vomiting, unspecified: Secondary | ICD-10-CM | POA: Diagnosis present

## 2017-04-13 DIAGNOSIS — K5651 Intestinal adhesions [bands], with partial obstruction: Secondary | ICD-10-CM | POA: Insufficient documentation

## 2017-04-13 DIAGNOSIS — R1110 Vomiting, unspecified: Secondary | ICD-10-CM | POA: Diagnosis not present

## 2017-04-13 DIAGNOSIS — K210 Gastro-esophageal reflux disease with esophagitis: Secondary | ICD-10-CM | POA: Insufficient documentation

## 2017-04-13 DIAGNOSIS — Z87442 Personal history of urinary calculi: Secondary | ICD-10-CM | POA: Insufficient documentation

## 2017-04-13 DIAGNOSIS — Z79899 Other long term (current) drug therapy: Secondary | ICD-10-CM | POA: Insufficient documentation

## 2017-04-13 DIAGNOSIS — K2950 Unspecified chronic gastritis without bleeding: Secondary | ICD-10-CM | POA: Diagnosis not present

## 2017-04-13 HISTORY — DX: Paralytic ileus: K56.0

## 2017-04-13 HISTORY — PX: ESOPHAGOGASTRODUODENOSCOPY: SHX5428

## 2017-04-13 LAB — URINALYSIS, ROUTINE W REFLEX MICROSCOPIC
BACTERIA UA: NONE SEEN
BILIRUBIN URINE: NEGATIVE
Glucose, UA: >=500 mg/dL — AB
HGB URINE DIPSTICK: NEGATIVE
Ketones, ur: 5 mg/dL — AB
Leukocytes, UA: NEGATIVE
Nitrite: NEGATIVE
PROTEIN: NEGATIVE mg/dL
SPECIFIC GRAVITY, URINE: 1.028 (ref 1.005–1.030)
pH: 5 (ref 5.0–8.0)

## 2017-04-13 LAB — COMPREHENSIVE METABOLIC PANEL
ALBUMIN: 3.9 g/dL (ref 3.5–5.0)
ALT: 45 U/L (ref 14–54)
ANION GAP: 13 (ref 5–15)
AST: 40 U/L (ref 15–41)
Alkaline Phosphatase: 77 U/L (ref 38–126)
BUN: 10 mg/dL (ref 6–20)
CHLORIDE: 105 mmol/L (ref 101–111)
CO2: 18 mmol/L — AB (ref 22–32)
Calcium: 9.1 mg/dL (ref 8.9–10.3)
Creatinine, Ser: 0.67 mg/dL (ref 0.44–1.00)
GFR calc Af Amer: >60 mL/min (ref >60)
GFR calc non Af Amer: >60 mL/min (ref >60)
GLUCOSE: 298 mg/dL — AB (ref 65–99)
POTASSIUM: 3.7 mmol/L (ref 3.5–5.1)
SODIUM: 136 mmol/L (ref 135–145)
Total Bilirubin: 0.5 mg/dL (ref 0.3–1.2)
Total Protein: 6.6 g/dL (ref 6.5–8.1)

## 2017-04-13 LAB — GLUCOSE, CAPILLARY
Glucose-Capillary: 109 mg/dL — ABNORMAL HIGH (ref 65–99)
Glucose-Capillary: 144 mg/dL — ABNORMAL HIGH (ref 65–99)

## 2017-04-13 LAB — CBC
HEMATOCRIT: 39.6 % (ref 36.0–46.0)
HEMOGLOBIN: 13.7 g/dL (ref 12.0–15.0)
MCH: 30.9 pg (ref 26.0–34.0)
MCHC: 34.6 g/dL (ref 30.0–36.0)
MCV: 89.2 fL (ref 78.0–100.0)
Platelets: 361 10*3/uL (ref 150–400)
RBC: 4.44 MIL/uL (ref 3.87–5.11)
RDW: 13.4 % (ref 11.5–15.5)
WBC: 9.3 10*3/uL (ref 4.0–10.5)

## 2017-04-13 LAB — HEMOGLOBIN A1C
HEMOGLOBIN A1C: 6.9 % — AB (ref 4.8–5.6)
MEAN PLASMA GLUCOSE: 151.33 mg/dL

## 2017-04-13 LAB — LIPASE, BLOOD: LIPASE: 33 U/L (ref 11–51)

## 2017-04-13 LAB — TYPE AND SCREEN
ABO/RH(D): O POS
Antibody Screen: NEGATIVE

## 2017-04-13 LAB — PROTIME-INR
INR: 0.85
Prothrombin Time: 11.5 seconds (ref 11.4–15.2)

## 2017-04-13 LAB — ABO/RH: ABO/RH(D): O POS

## 2017-04-13 LAB — I-STAT BETA HCG BLOOD, ED (MC, WL, AP ONLY)

## 2017-04-13 SURGERY — EGD (ESOPHAGOGASTRODUODENOSCOPY)
Anesthesia: Moderate Sedation

## 2017-04-13 MED ORDER — DIPHENHYDRAMINE HCL 50 MG/ML IJ SOLN
INTRAMUSCULAR | Status: AC
  Filled 2017-04-13: qty 1

## 2017-04-13 MED ORDER — ENOXAPARIN SODIUM 40 MG/0.4ML ~~LOC~~ SOLN
40.0000 mg | SUBCUTANEOUS | Status: DC
  Administered 2017-04-13: 40 mg via SUBCUTANEOUS
  Filled 2017-04-13: qty 0.4

## 2017-04-13 MED ORDER — SODIUM CHLORIDE 0.9 % IV SOLN
INTRAVENOUS | Status: AC | PRN
  Administered 2017-04-13: 500 mL via INTRAVENOUS

## 2017-04-13 MED ORDER — SODIUM CHLORIDE 0.9 % IV SOLN
80.0000 mg | Freq: Two times a day (BID) | INTRAVENOUS | Status: DC
  Administered 2017-04-13 – 2017-04-14 (×3): 80 mg via INTRAVENOUS
  Filled 2017-04-13 (×4): qty 80

## 2017-04-13 MED ORDER — TIZANIDINE HCL 2 MG PO TABS
4.0000 mg | ORAL_TABLET | Freq: Three times a day (TID) | ORAL | Status: DC | PRN
  Administered 2017-04-13: 4 mg via ORAL
  Filled 2017-04-13: qty 2

## 2017-04-13 MED ORDER — INSULIN ASPART 100 UNIT/ML ~~LOC~~ SOLN: 0.0000 [IU] | Freq: Every day | SUBCUTANEOUS | Status: DC

## 2017-04-13 MED ORDER — DIPHENHYDRAMINE HCL 50 MG/ML IJ SOLN
50.0000 mg | Freq: Once | INTRAMUSCULAR | Status: AC
  Administered 2017-04-13: 50 mg via INTRAVENOUS

## 2017-04-13 MED ORDER — POTASSIUM CHLORIDE IN NACL 20-0.9 MEQ/L-% IV SOLN
INTRAVENOUS | Status: DC
Start: 2017-04-13 — End: 2017-04-14
  Administered 2017-04-13 – 2017-04-14 (×2): via INTRAVENOUS
  Filled 2017-04-13 (×3): qty 1000

## 2017-04-13 MED ORDER — ONDANSETRON HCL 4 MG/2ML IJ SOLN
4.0000 mg | Freq: Four times a day (QID) | INTRAMUSCULAR | Status: DC | PRN
  Administered 2017-04-13 – 2017-04-14 (×2): 4 mg via INTRAVENOUS
  Filled 2017-04-13 (×2): qty 2

## 2017-04-13 MED ORDER — MIDAZOLAM HCL 5 MG/ML IJ SOLN
INTRAMUSCULAR | Status: AC
  Filled 2017-04-13: qty 2

## 2017-04-13 MED ORDER — LIDOCAINE VISCOUS 2 % MT SOLN: 15.0000 mL | Freq: Once | OROMUCOSAL | Status: DC

## 2017-04-13 MED ORDER — FENTANYL CITRATE (PF) 100 MCG/2ML IJ SOLN
INTRAMUSCULAR | Status: DC | PRN
  Administered 2017-04-13 (×4): 25 ug via INTRAVENOUS

## 2017-04-13 MED ORDER — PROMETHAZINE HCL 25 MG/ML IJ SOLN
INTRAMUSCULAR | Status: AC
  Filled 2017-04-13: qty 1

## 2017-04-13 MED ORDER — IOPAMIDOL (ISOVUE-300) INJECTION 61%
INTRAVENOUS | Status: AC
  Administered 2017-04-13: 100 mL
  Filled 2017-04-13: qty 100

## 2017-04-13 MED ORDER — SODIUM CHLORIDE 0.9% FLUSH
3.0000 mL | Freq: Two times a day (BID) | INTRAVENOUS | Status: DC
  Administered 2017-04-13 – 2017-04-14 (×2): 3 mL via INTRAVENOUS

## 2017-04-13 MED ORDER — DIPHENHYDRAMINE HCL 50 MG/ML IJ SOLN
INTRAMUSCULAR | Status: DC | PRN
Start: 2017-04-13 — End: 2017-04-13
  Administered 2017-04-13: 25 mg via INTRAVENOUS

## 2017-04-13 MED ORDER — MORPHINE SULFATE (PF) 4 MG/ML IV SOLN
2.0000 mg | Freq: Once | INTRAVENOUS | Status: DC
Start: 2017-04-13 — End: 2017-04-13

## 2017-04-13 MED ORDER — SODIUM CHLORIDE 0.9 % IV BOLUS (SEPSIS)
1000.0000 mL | Freq: Once | INTRAVENOUS | Status: AC
  Administered 2017-04-13: 1000 mL via INTRAVENOUS

## 2017-04-13 MED ORDER — LORAZEPAM 2 MG/ML IJ SOLN
1.0000 mg | Freq: Once | INTRAMUSCULAR | Status: AC
  Administered 2017-04-13: 1 mg via INTRAVENOUS
  Filled 2017-04-13: qty 1

## 2017-04-13 MED ORDER — PROMETHAZINE HCL 25 MG/ML IJ SOLN
INTRAMUSCULAR | Status: DC | PRN
  Administered 2017-04-13: 25 mg via INTRAVENOUS

## 2017-04-13 MED ORDER — QUETIAPINE FUMARATE 50 MG PO TABS
125.0000 mg | ORAL_TABLET | Freq: Every day | ORAL | Status: DC
  Administered 2017-04-14: 125 mg via ORAL
  Filled 2017-04-13 (×2): qty 2

## 2017-04-13 MED ORDER — MORPHINE SULFATE (PF) 4 MG/ML IV SOLN
4.0000 mg | Freq: Once | INTRAVENOUS | Status: AC
  Administered 2017-04-13: 4 mg via INTRAVENOUS
  Filled 2017-04-13: qty 1

## 2017-04-13 MED ORDER — DIPHENHYDRAMINE HCL 25 MG PO CAPS: 25.0000 mg | ORAL_CAPSULE | Freq: Four times a day (QID) | ORAL | Status: DC | PRN

## 2017-04-13 MED ORDER — MIDAZOLAM HCL 10 MG/2ML IJ SOLN
INTRAMUSCULAR | Status: DC | PRN
  Administered 2017-04-13 (×5): 2 mg via INTRAVENOUS

## 2017-04-13 MED ORDER — PROMETHAZINE HCL 25 MG/ML IJ SOLN
12.5000 mg | Freq: Four times a day (QID) | INTRAMUSCULAR | Status: DC | PRN
  Administered 2017-04-13 – 2017-04-14 (×3): 25 mg via INTRAVENOUS
  Administered 2017-04-14: 12.5 mg via INTRAVENOUS
  Filled 2017-04-13 (×4): qty 1

## 2017-04-13 MED ORDER — DULOXETINE HCL 60 MG PO CPEP
60.0000 mg | ORAL_CAPSULE | Freq: Every day | ORAL | Status: DC
  Administered 2017-04-13 – 2017-04-14 (×2): 60 mg via ORAL
  Filled 2017-04-13 (×2): qty 1

## 2017-04-13 MED ORDER — HYDROCORTISONE NA SUCCINATE PF 100 MG IJ SOLR: 100.0000 mg | Freq: Once | INTRAMUSCULAR | Status: DC

## 2017-04-13 MED ORDER — MORPHINE SULFATE (PF) 4 MG/ML IV SOLN: 1.0000 mg | Freq: Once | INTRAVENOUS | Status: DC

## 2017-04-13 MED ORDER — DIPHENHYDRAMINE HCL 50 MG/ML IJ SOLN: 25.0000 mg | Freq: Once | INTRAMUSCULAR | Status: AC

## 2017-04-13 MED ORDER — FENTANYL CITRATE (PF) 100 MCG/2ML IJ SOLN
INTRAMUSCULAR | Status: AC
  Filled 2017-04-13: qty 4

## 2017-04-13 MED ORDER — INSULIN ASPART 100 UNIT/ML ~~LOC~~ SOLN: 0.0000 [IU] | Freq: Three times a day (TID) | SUBCUTANEOUS | Status: DC

## 2017-04-13 MED ORDER — ONDANSETRON HCL 4 MG/2ML IJ SOLN
INTRAMUSCULAR | Status: AC
  Filled 2017-04-13: qty 2

## 2017-04-13 MED ORDER — MORPHINE SULFATE (PF) 4 MG/ML IV SOLN
4.0000 mg | INTRAVENOUS | Status: DC | PRN
Start: 2017-04-13 — End: 2017-04-14
  Administered 2017-04-13 – 2017-04-14 (×6): 4 mg via INTRAVENOUS
  Filled 2017-04-13 (×6): qty 1

## 2017-04-13 NOTE — Interval H&P Note (Signed)
History and Physical Interval Note:  04/13/2017 2:54 PM  Loretta Alexander  has presented today for surgery, with the diagnosis of Hematemesis.  Chronic nausea and vomiting  The various methods of treatment have been discussed with the patient and family. After consideration of risks, benefits and other options for treatment, the patient has consented to  Procedure(s): ESOPHAGOGASTRODUODENOSCOPY (EGD) (N/A) as a surgical intervention .  The patient's history has been reviewed, patient examined, no change in status, stable for surgery.  I have reviewed the patient's chart and labs.  Questions were answered to the patient's satisfaction.     Rachael Feeaniel P Emmerich Cryer

## 2017-04-13 NOTE — ED Notes (Signed)
Patient transported to X-ray 

## 2017-04-13 NOTE — ED Provider Notes (Signed)
[facility] EMERGENCY DEPARTMENT Provider Note   CSN: [id number] Arrival date & time: 04/13/17  2671  History   Chief Complaint Chief Complaint  Patient presents with  . Hematemesis   HPI Loretta Alexander is a 36 y.o. female with a PMHx significant for partial SBO, GERD (esophagitis and gastritis per EGD in 2012), and multiple abdominal surgeries (appendectomy, cholecystectomy, right oophorectomy, adhesion takedown, gastric banding, and partial colonic resection) who presented to the ED with hematemesis x 4. This AM she woke up with N/V and severe retching, subsequently had 4 episodes of hematemesis (pictures on the patient's phone). For the past 12 weeks she has had daily N/V and diarrhea. This is the first time she has had hematemesis. She states she sees [facility] for work-up of chronic intermittent bowel obstructions. She states that her SOB typically presented with intractable N/V and either diarrhea or constipation. She describes her current diarrhea as fatty and foul smelling. Recent stool studies were negative for infectious causes. She has had multiple CT abdomens in the past and would like to avoid one today.   She endorses abdominal pain, decreased PO intake, and dizziness with standing. She denies excessive use of NSAIDs, history of liver dysfunction, excessive EtOH use, tobacco use, sick contact, blood stools, black/tary stools.   Family history significant for stomach cancer in her mother, colon cancer in her grandparents and Celiacs disease. She denies a family history of IBD.  Per GI note: Plans for an EGD and colonoscopy in January, testing for celiac disease, and gastric emptying study if negative EGD. If gastric emptying study is negative they would recommend MR enterography.   Past Medical History:  Diagnosis Date  . Anxiety   . Complication of anesthesia    Difficult IV stick with Central line placement, TACHY 180-190 S/P COLON SURGERY (resolved with  treatment for pain)   . Depression   . Drug-seeking behavior   . Dysrhythmia    INCREASED HR AT TIMES  . Esophagitis determined by endoscopy    03-2011 EGD  . Eye infection    within last 2 weeks  . Gastritis 03/2011   Per EGD  . GERD (gastroesophageal reflux disease)    OCC TUMS  . Intestinal obstruction ([place])   . Kidney stones    Kidney Stones  . Migraines   . Obesity   . Ovarian cyst, right    Hx of oophorectomy for adhesions  . Pneumonia    04/2010  . Thyroid nodule   . Type 2 diabetes mellitus ([place]) 01/03/2017  . UTI (lower urinary tract infection)    Patient Active Problem List   Diagnosis Date Noted  . Anaphylaxis 01/03/2017  . Type 2 diabetes mellitus ([place]) 01/03/2017  . Vaginal discharge 12/18/2015  . Bowel obstruction ([place]) 12/17/2015  . Dysphagia 05/09/2015  . Neck swelling 05/08/2015  . S/P cervical spinal fusion 04/28/2015  . Migraine headache 01/20/2013  . Diplopia 01/20/2013  . Left-sided weakness 01/20/2013  . Right sided abdominal pain 01/11/2012  . Hyperglycemia 01/11/2012  . Drug-seeking behavior 01/11/2012  . Obesity 01/11/2012  . Constipation 01/11/2012  . Depression 08/31/2011  . Gastritis 04/06/2011  . Abdominal pain 04/03/2011  . Foreign body 04/03/2011  . Nausea with vomiting 04/03/2011   Past Surgical History:  Procedure Laterality Date  . ABDOMINAL ADHESION SURGERY  2006   BIL FALLOPIAN TUBES REMOVED  . ANTERIOR CERVICAL DECOMP/DISCECTOMY FUSION N/A 04/28/2015   Procedure: Cervical five-six, Cervical six - seven Anterior cervical decompression/diskectomy/fusion;  Surgeon: Eustace Moore, MD;  Location: [facility] NEURO ORS;  Service: Neurosurgery;  Laterality: N/A;  C5-6 C6-7 Anterior cervical decompression/diskectomy/fusion  . APPENDECTOMY  1992  . CHOLECYSTECTOMY  2003  . COLON SURGERY     MAY 2015     BOWEL OBST s/p partial bowel resection ([place])  . ESOPHAGOGASTRODUODENOSCOPY  04/05/2011   Procedure: ESOPHAGOGASTRODUODENOSCOPY  (EGD);  Surgeon: Juanita Craver, MD;  Location: [facility] ENDOSCOPY;  Service: Endoscopy;  Laterality: N/A;  . KNEE ARTHROSCOPY  2001, 2010   left knee x 2  . LAPAROSCOPIC GASTRIC BANDING    . NASAL SEPTOPLASTY W/ TURBINOPLASTY  03/16/2011   Procedure: NASAL SEPTOPLASTY WITH TURBINATE REDUCTION;  Surgeon: Beckie Salts, MD;  Location: [facility];  Service: ENT;  Laterality: Bilateral;  . PILONIDAL CYST EXCISION    . removal of lap band    . right ovary removed  2006  . TONSILLECTOMY  1991  . UMBILICAL HERNIA REPAIR     OB History    No data available     Home Medications    Prior to Admission medications   Medication Sig Start Date End Date Taking? Authorizing Provider  Cetirizine HCl 10 MG CAPS Take 1 capsule (10 mg total) by mouth daily. 01/03/17   Johnson, Clanford L, MD  DULoxetine (CYMBALTA) 60 MG capsule Take 60 mg by mouth daily.     [provider]  EPIPEN 2-PAK 0.3 MG/0.3ML SOAJ injection as needed. 01/03/17   [provider]  famotidine (PEPCID) 20 MG tablet Take 1 tablet (20 mg total) by mouth 2 (two) times daily. 01/03/17 03/19/17  Murlean Iba, MD  levonorgestrel (MIRENA) 20 MCG/24HR IUD 1 each by Intrauterine route once. 2010    [provider]  metFORMIN (GLUCOPHAGE-XR) 500 MG 24 hr tablet Take 2 tablets (1,000 mg total) by mouth 2 (two) times daily with a meal. 01/03/17 03/19/17  Johnson, Clanford L, MD  Na Sulfate-K Sulfate-Mg Sulf 17.5-3.13-1.6 GM/177ML SOLN Take 1 kit as directed by mouth. 03/23/17   Armbruster, Carlota Raspberry, MD  pantoprazole (PROTONIX) 40 MG tablet Take 1 tablet (40 mg total) daily by mouth. Can increase to twice a day as needed 03/19/17   Armbruster, Carlota Raspberry, MD  promethazine (PHENERGAN) 12.5 MG tablet Take 1 tablet (12.5 mg total) every 6 (six) hours as needed by mouth for nausea or vomiting. 03/19/17   Armbruster, Carlota Raspberry, MD  propranolol ER (INDERAL LA) 80 MG 24 hr capsule Take 1 capsule (80 mg total) by mouth daily. 06/16/16   Tomi Likens,  Adam R, DO  QUEtiapine (SEROQUEL) 50 MG tablet Take 150 mg by mouth at bedtime.    [provider]  tiZANidine (ZANAFLEX) 4 MG tablet Take 1 tablet 3 (three) times daily as needed by mouth. 03/07/17   [provider]   Family History Family History  Problem Relation Age of Onset  . Stomach cancer Mother   . Esophageal cancer Mother   . Colon polyps Mother   . Heart attack Father 11  . Diabetes Maternal Grandmother   . Heart disease Maternal Grandmother   . Celiac disease Paternal Aunt    Social History Social History   Tobacco Use  . Smoking status: Never Smoker  . Smokeless tobacco: Never Used  Substance Use Topics  . Alcohol use: Yes    Comment: rarely  . Drug use: No   Allergies   Prochlorperazine maleate; Dilaudid [hydromorphone hcl]; Compazine [prochlorperazine maleate]; Haloperidol and related; Reglan [metoclopramide hcl]; and Toradol [  ketorolac tromethamine]  Review of Systems  All systems reviewed and are negative for acute change except as noted in the HPI.  Physical Exam Updated Vital Signs BP 127/83   Pulse (!) 117   Temp 98 F (36.7 C) (Oral)   Resp 16   Ht _0  (1.575 m)   Wt 97.5 kg (215 lb)   SpO2 99%   BMI 39.32 kg/m   General: Obese female in no acute distress HENT: Normocephalic, atraumatic, dry mucus membranes  Pulm: Good air movement with no wheezing or crackles  CV: Tachycardia with regular rhythm, no murmurs or rubs  Abdomen: Active bowel sounds, non-distended, tenderness to palpation of the LUQ and LLQ Extremities: No LE edema  Skin: Warm and dry  Neuro: Alert and oriented x 3  ED Treatments / Results  Labs (all labs ordered are listed, but only abnormal results are displayed) Labs Reviewed  COMPREHENSIVE METABOLIC PANEL - Abnormal; Notable for the following components:      Result Value   CO2 18 (*)    Glucose, Bld 298 (*)    All other components within normal limits  LIPASE, BLOOD  CBC  URINALYSIS, ROUTINE W  REFLEX MICROSCOPIC  PROTIME-INR  I-STAT BETA HCG BLOOD, ED ([facility], [facility], [facility] ONLY)  TYPE AND SCREEN   EKG  EKG Interpretation None      Radiology No results found.  Procedures Procedures (including critical care time)  Medications Ordered in ED Medications  promethazine (PHENERGAN) injection 12.5-25 mg (not administered)  pantoprazole (PROTONIX) 80 mg in sodium chloride 0.9 % 100 mL IVPB (not administered)  sodium chloride 0.9 % bolus 1,000 mL (not administered)  morphine 4 MG/ML injection 4 mg (not administered)   Initial Impression / Assessment and Plan / ED Course  I have reviewed the triage vital signs and the nursing notes.  Pertinent labs & imaging results that were available during my care of the patient were reviewed by me and considered in my medical decision making (see chart for details).    Patient presented with hematemesis after excessive vomiting/retching that has since resolved. This is most consistent with a Mallory-Weis tear. She was started on IVF with IV Protonix 80 mg BID, Phenergan for N/V, and Morphine for abdominal pain. Case was discussed with GI who recommended an abdominal series with admission for EGD and colonoscopy. She is currently hemodynamically stable. Case discussed with [facility].   Decision to admit and plan was discussed with the patient, who agreed. She voiced understanding and all questions/concerns were addressed.   Final Clinical Impressions(s) / ED Diagnoses   Final diagnoses:  Hematemesis with nausea   ED Discharge Orders    None       Ina Homes, MD 04/13/17 [id number]    Carmin Muskrat, MD 04/15/17 1058

## 2017-04-13 NOTE — ED Notes (Signed)
Attempted to call report to 5 M. Left name & phone number for floor RN to return call for report. Informed NS that patient has been taken to ENDO, and will then come to her room.

## 2017-04-13 NOTE — ED Triage Notes (Signed)
Per Pt, Pt is coming from home with complaints of vomiting blood that started this morning. Pt has hx of GI intermittent obstructions. Reports vomiting x 2 days and excessive diarrhea for multiple weeks.
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
## 2017-04-13 NOTE — H&P (Signed)
History and Physical    Loretta Alexander:[id number] DOB: 1980/12/16 DOA: 04/13/2017  PCP: Lois Huxley, PA  Patient coming from: Home  I have personally briefly reviewed patient's old medical records in [place]  Chief Complaint: Hematemesis with 4 episodes since this morning  HPI: Loretta Alexander is a 36 y.o. female with medical history significant  for partial SBO, GERD (esophagitis and gastritis per EGD in 2012), multiple abdominal surgeries (appendectomy, cholecystectomy, right oophorectomy, adhesion takedown, gastric banding,andpartial colonic resection), type 2 diabetes mellitus, depression, anxiety, obesity and a recent admission in August 2018 for possible anaphylaxis who presented to the ED with hematemesis x 4 (documented with photos on her cell phone). This AM she woke up with N/V and severe retching, subsequently had 4 episodes of hematemesis. For the past 12 weeks she has had daily N/V and diarrhea. This is the first time she has had hematemesis. She states she sees [facility] for work-up of chronic intermittent bowel obstructions. She denies excessive use of NSAIDs, history of liver dysfunction.  Multiple CT scans in the past and would prefer we avoid one.  SOB typically presented with intractable N/V and either diarrhea or constipation.  Family history of stomach cancer in her mother and colon cancer in her grandparents  Family history of [place] disease, no history of IBD Stools are loose and appear fatty, has had blood in her stool before  Recent stool studies negative for infectious cause   Per review of her record GI  plans EGD and colonoscopy in January, testing for celiac disease, and gastric emptying study if negative EGD. If gastric emptying study is negative they would recommend MR enterography.   ED Course: White blood cell count 13.7 with tachycardia.  She is not actually orthostatic as noted by the ED physician she has negative orthostatics per sitting  and standing criteria.  She does have a history of gastroesophageal reflux disease with esophagitis and gastritis takes Protonix twice daily.  Given conversation with GI it is likely she has a Mallory-Weiss tear and admission to the hospital with consultation to GI has been requested.  Review of Systems: As per HPI otherwise 10 point review of systems negative.   Past Medical History:  Diagnosis Date  . Anxiety   . Complication of anesthesia    Difficult IV stick with Central line placement, TACHY 180-190 S/P COLON SURGERY (resolved with treatment for pain)   . Depression   . Drug-seeking behavior   . Dysrhythmia    INCREASED HR AT TIMES  . Esophagitis determined by endoscopy    03-2011 EGD  . Eye infection    within last 2 weeks  . Gastritis 03/2011   Per EGD  . GERD (gastroesophageal reflux disease)    OCC TUMS  . Intestinal obstruction ([place])   . Kidney stones    Kidney Stones  . Migraines   . Obesity   . Ovarian cyst, right    Hx of oophorectomy for adhesions  . Pneumonia    04/2010  . Thyroid nodule   . Type 2 diabetes mellitus ([place]) 01/03/2017  . UTI (lower urinary tract infection)     Past Surgical History:  Procedure Laterality Date  . ABDOMINAL ADHESION SURGERY  2006   BIL FALLOPIAN TUBES REMOVED  . ANTERIOR CERVICAL DECOMP/DISCECTOMY FUSION N/A 04/28/2015   Procedure: Cervical five-six, Cervical six - seven Anterior cervical decompression/diskectomy/fusion;  Surgeon: Eustace Moore, MD;  Location: [facility];  Service: Neurosurgery;  Laterality:  N/A;  C5-6 C6-7 Anterior cervical decompression/diskectomy/fusion  . APPENDECTOMY  1992  . CHOLECYSTECTOMY  2003  . COLON SURGERY     MAY 2015     BOWEL OBST s/p partial bowel resection ([place])  . ESOPHAGOGASTRODUODENOSCOPY  04/05/2011   Procedure: ESOPHAGOGASTRODUODENOSCOPY (EGD);  Surgeon: Juanita Craver, MD;  Location: [facility];  Service: Endoscopy;  Laterality: N/A;  . KNEE ARTHROSCOPY  2001, 2010   left  knee x 2  . LAPAROSCOPIC GASTRIC BANDING    . NASAL SEPTOPLASTY W/ TURBINOPLASTY  03/16/2011   Procedure: NASAL SEPTOPLASTY WITH TURBINATE REDUCTION;  Surgeon: Beckie Salts, MD;  Location: [facility];  Service: ENT;  Laterality: Bilateral;  . PILONIDAL CYST EXCISION    . removal of lap band    . right ovary removed  2006  . TONSILLECTOMY  1991  . UMBILICAL HERNIA REPAIR       reports that  has never smoked. she has never used smokeless tobacco. She reports that she drinks alcohol. She reports that she does not use drugs.  Allergies  Allergen Reactions  . Prochlorperazine Maleate Other (See Comments)    Patient can tolerate phenergan Major anxiety attack  . Dilaudid [Hydromorphone Hcl] Itching    Ok with benadryl  . Compazine [Prochlorperazine Maleate] Anxiety and Other (See Comments)    dizziness  . Haloperidol And Related Anxiety and Other (See Comments)    dizziness  . Reglan [Metoclopramide Hcl] Anxiety and Other (See Comments)    dizziness  . Toradol [Ketorolac Tromethamine] Nausea And Vomiting    Family History  Problem Relation Age of Onset  . Stomach cancer Mother   . Esophageal cancer Mother   . Colon polyps Mother   . Heart attack Father 32  . Diabetes Maternal Grandmother   . Heart disease Maternal Grandmother   . Celiac disease Paternal Aunt     Prior to Admission medications   Medication Sig Start Date End Date Taking? Authorizing Provider  Cetirizine HCl 10 MG CAPS Take 1 capsule (10 mg total) by mouth daily. 01/03/17   Johnson, Clanford L, MD  DULoxetine (CYMBALTA) 60 MG capsule Take 60 mg by mouth daily.     [provider]  EPIPEN 2-PAK 0.3 MG/0.3ML SOAJ injection as needed. 01/03/17   [provider]  famotidine (PEPCID) 20 MG tablet Take 1 tablet (20 mg total) by mouth 2 (two) times daily. 01/03/17 03/19/17  Murlean Iba, MD  levonorgestrel (MIRENA) 20 MCG/24HR IUD 1 each by Intrauterine route once. 2010    [provider]    metFORMIN (GLUCOPHAGE-XR) 500 MG 24 hr tablet Take 2 tablets (1,000 mg total) by mouth 2 (two) times daily with a meal. 01/03/17 03/19/17  Johnson, Clanford L, MD  Na Sulfate-K Sulfate-Mg Sulf 17.5-3.13-1.6 GM/177ML SOLN Take 1 kit as directed by mouth. 03/23/17   Armbruster, Carlota Raspberry, MD  pantoprazole (PROTONIX) 40 MG tablet Take 1 tablet (40 mg total) daily by mouth. Can increase to twice a day as needed 03/19/17   Armbruster, Carlota Raspberry, MD  promethazine (PHENERGAN) 12.5 MG tablet Take 1 tablet (12.5 mg total) every 6 (six) hours as needed by mouth for nausea or vomiting. 03/19/17   Armbruster, Carlota Raspberry, MD  propranolol ER (INDERAL LA) 80 MG 24 hr capsule Take 1 capsule (80 mg total) by mouth daily. 06/16/16   Tomi Likens, Adam R, DO  QUEtiapine (SEROQUEL) 50 MG tablet Take 150 mg by mouth at bedtime.    [provider]  tiZANidine (  ZANAFLEX) 4 MG tablet Take 1 tablet 3 (three) times daily as needed by mouth. 03/07/17   [provider]    Physical Exam: Vitals:   04/13/17 0940 04/13/17 0942 04/13/17 0945 04/13/17 1000  BP: 121/75 130/89 (!) 136/91 127/83  Pulse:  (!) 130 (!) 125 (!) 117  Resp:      Temp:      TempSrc:      SpO2:  99% 99% 99%  Weight:      Height:        Constitutional: NAD, calm, comfortable Vitals:   04/13/17 0940 04/13/17 0942 04/13/17 0945 04/13/17 1000  BP: 121/75 130/89 (!) 136/91 127/83  Pulse:  (!) 130 (!) 125 (!) 117  Resp:      Temp:      TempSrc:      SpO2:  99% 99% 99%  Weight:      Height:       Eyes: PERRL, lids and conjunctivae normal ENMT: Mucous membranes are moist. Posterior pharynx clear of any exudate or lesions.Normal dentition.  Neck: normal, supple, no masses, no thyromegaly Respiratory: clear to auscultation bilaterally, no wheezing, no crackles. Normal respiratory effort. No accessory muscle use.  Cardiovascular: Regular rate and rhythm, no murmurs / rubs / gallops. No extremity edema. 2+ pedal pulses. No carotid bruits.   Abdomen: no tenderness, no masses palpated. No hepatosplenomegaly. Bowel sounds hypoactive, soft, slight rebound no guarding Musculoskeletal: no clubbing / cyanosis. No joint deformity upper and lower extremities. Good ROM, no contractures. Normal muscle tone.  Skin: no rashes, lesions, ulcers. No induration; multiple abdominal scars including horizontal and vertical midline scars Neurologic: CN 2-12 grossly intact. Sensation intact, DTR normal. Strength 5/5 in all 4.  Psychiatric: Normal judgment and insight. Alert and oriented x 3. Normal mood.    Labs on Admission: I have personally reviewed following labs and imaging studies  CBC: Recent Labs  Lab 04/13/17 0902  WBC 9.3  HGB 13.7  HCT 39.6  MCV 89.2  PLT 716   Basic Metabolic Panel: Recent Labs  Lab 04/13/17 0902  NA 136  K 3.7  CL 105  CO2 18*  GLUCOSE 298*  BUN 10  CREATININE 0.67  CALCIUM 9.1   GFR: Estimated Creatinine Clearance: 106 mL/min (by C-G formula based on SCr of 0.67 mg/dL). Liver Function Tests: Recent Labs  Lab 04/13/17 0902  AST 40  ALT 45  ALKPHOS 77  BILITOT 0.5  PROT 6.6  ALBUMIN 3.9   Recent Labs  Lab 04/13/17 0902  LIPASE 33   No results for input(s): AMMONIA in the last 168 hours. Coagulation Profile: No results for input(s): INR, PROTIME in the last 168 hours. Cardiac Enzymes: No results for input(s): CKTOTAL, CKMB, CKMBINDEX, TROPONINI in the last 168 hours. BNP (last 3 results) No results for input(s): PROBNP in the last 8760 hours. HbA1C: No results for input(s): HGBA1C in the last 72 hours. CBG: No results for input(s): GLUCAP in the last 168 hours. Lipid Profile: No results for input(s): CHOL, HDL, LDLCALC, TRIG, CHOLHDL, LDLDIRECT in the last 72 hours. Thyroid Function Tests: No results for input(s): TSH, T4TOTAL, FREET4, T3FREE, THYROIDAB in the last 72 hours. Anemia Panel: No results for input(s): VITAMINB12, FOLATE, FERRITIN, TIBC, IRON, RETICCTPCT in the last 72  hours. Urine analysis:    Component Value Date/Time   COLORURINE YELLOW 04/13/2017 0859   APPEARANCEUR HAZY (A) 04/13/2017 0859   APPEARANCEUR Cloudy 12/29/2013 1640   LABSPEC 1.028 04/13/2017 0859   LABSPEC 1.015  12/29/2013 1640   PHURINE 5.0 04/13/2017 0859   GLUCOSEU >=500 (A) 04/13/2017 0859   GLUCOSEU Negative 12/29/2013 1640   HGBUR NEGATIVE 04/13/2017 0859   BILIRUBINUR NEGATIVE 04/13/2017 0859   BILIRUBINUR Negative 12/29/2013 1640   KETONESUR 5 (A) 04/13/2017 0859   PROTEINUR NEGATIVE 04/13/2017 0859   UROBILINOGEN 1.0 04/13/2014 1646   NITRITE NEGATIVE 04/13/2017 0859   LEUKOCYTESUR NEGATIVE 04/13/2017 0859   LEUKOCYTESUR Negative 12/29/2013 1640    Radiological Exams on Admission: Dg Abdomen Acute W/chest  Result Date: 04/13/2017 CLINICAL DATA:  Abdominal pain.  Hematemesis.  Diarrhea. EXAM: DG ABDOMEN ACUTE W/ 1V CHEST COMPARISON:  CT 03/08/2017. FINDINGS: Mediastinum hilar structures normal. Mild basilar atelectasis. No pleural effusion or pneumothorax. Surgical clips right upper quadrant. IUD noted the pelvis. Scattered air-fluid levels are noted throughout the small and large bowel. Diarrheal illness and/or mild adynamic ileus could present in this fashion. No free air. No acute bony abnormality. IMPRESSION: Scattered air-fluid levels noted throughout the small large bowel. Diarrheal illness and/or mild adynamic ileus could present in this fashion. Electronically Signed   By: [place]   On: 04/13/2017 10:54    Assessment/Plan Principal Problem:   Adynamic ileus ([place]) Active Problems:   Hematemesis with nausea   Nausea with vomiting   Type 2 diabetes mellitus ([facility])   Mallory-Weiss tear   Depression   Obesity  1.  Adynamic ileus: Patient will be admitted into the hospital she has not had any significant nausea and vomiting in the past couple of hours.  We will try to avoid placing an NG tube.  If vomiting recurs and persists then will require placement of  NG tube.  GI is going to see the patient and perform upper and lower endoscopy.  Patient has requested that we avoid CT scanning as she has had so many in the past few years.  2.  Hematemesis with nausea: Patient has been under the care of GI and they have had concerns about her persistent nausea vomiting and diarrhea.  They had plan to do upper and lower endoscopy in January.  After discussion with Dr. [place] today plan is to accelerate EGD to this admission.  She may also require colonoscopy.  3.  Nausea with vomiting and associated diarrhea: Patient never had any symptoms prior to 12 weeks ago.  I doubt that this is any form of IBS or overgrowth syndrome.  She has been checked for C. difficile and it has been negative.  Suspect this may be intermittent bowel obstructions.  Acute abdominal series shows adynamic ileus.  Will recheck in a.m.  4.  Type 2 diabetes mellitus: Plan sliding scale insulin coverage as well as fingerstick blood glucoses.  We will hold metformin while she is in the hospital.  5.  Mallory-Weiss tear: This is a probable diagnosis pending esophagogastroduodenoscopy.  Highly suspected though given her multiple episodes of nausea and vomiting.  6.  Depression: Continue Seroquel and Cymbalta.   7.  Obesity: Weight loss is recommended.  This patient is acutely ill and requires evaluation in the hospital with possible full admission.  At this time her nausea and vomiting seems to have quiesced and she does not require placement of an NG tube.  Should she require placement of an NG tube she should be converted to inpatient stay.      DVT prophylaxis: Lovenox Code Status: Full Family Communication: Patient retains capacity no family present at time of admission Disposition Plan: Hopefully home in 24-72 hours Consults  called: [facility] via ED ( Dr. Ardis Hughs) Admission status: obs   Lady Deutscher MD FACP [facility] Pager [phone] If 7PM-7AM,  please contact night-coverage [email or web address] Password [email or web address]  04/13/2017, 11:15 AM

## 2017-04-13 NOTE — Op Note (Addendum)
[facility] Patient Name: Loretta CitronStacy Alexander Procedure Date : 04/13/2017 MRN: [id number] Attending MD: Rachael Feeaniel P Sohrab Keelan , MD Date of Birth: 07/29/1980 CSN: [id number] Age: 36 Admit Type: Outpatient Procedure:                Upper GI endoscopy Indications:              Hematemesis (minor, normal Hb), Chronic                            intermittent nausea with vomiting Providers:                Rachael Feeaniel P. Bandy Honaker, MD, Dwain SarnaPatricia Ford, RN, Harrington ChallengerHope                            Parker, Technician Referring MD:              Medicines:                Fentanyl 100 micrograms IV, Midazolam 10 mg IV,                            Diphenhydramine 25 mg IV Complications:            No immediate complications. Estimated blood loss:                            None. Estimated Blood Loss:     Estimated blood loss: none. Procedure:                Pre-Anesthesia Assessment:                           - Prior to the procedure, a History and Physical                            was performed, and patient medications and                            allergies were reviewed. The patient's tolerance of                            previous anesthesia was also reviewed. The risks                            and benefits of the procedure and the sedation                            options and risks were discussed with the patient.                            All questions were answered, and informed consent                            was obtained. Prior Anticoagulants: The patient has  taken no previous anticoagulant or antiplatelet                            agents. ASA Grade Assessment: II - A patient with                            mild systemic disease. After reviewing the risks                            and benefits, the patient was deemed in                            satisfactory condition to undergo the procedure.                           After obtaining informed consent, the endoscope  was                            passed under direct vision. Throughout the                            procedure, the patient's blood pressure, pulse, and                            oxygen saturations were monitored continuously. The                            Endoscope was introduced through the mouth, and                            advanced to the second part of duodenum. The upper                            GI endoscopy was accomplished without difficulty.                            The patient tolerated the procedure well. Scope In: Scope Out: Findings:      Slightly irregular Z line, non-nodular.      Mild inflammation characterized by erythema and friability was found in       the gastric antrum. Biopsies were taken with a cold forceps for       histology.      The examined duodenum was normal. Biopsies for histology were taken with       a cold forceps for evaluation of celiac disease. Impression:               - Mild non-specific gastritis, biopsied to check                            for H. pylori.                           - Normal examined duodenum. Biopsied to check for  celiac sprue changes.                           - Slightly irregular but non-nodular Z line. Moderate Sedation:      Moderate (conscious) sedation was administered by the endoscopy nurse       and supervised by the endoscopist. The following parameters were       monitored: oxygen saturation, heart rate, blood pressure, and response       to care. Total physician intraservice time was 19 minutes. Recommendation:           - Return patient to hospital ward for ongoing care.                           - Clear liquid diet.                           - Continue present medications.                           - She tells me she's had worse than usual LUQ pains                            lately, I am ordering a CT Scan abd/pelvis to be                            done. Procedure Code(s):         --- Professional ---                           [phone], Esophagogastroduodenoscopy, flexible,                            transoral; with biopsy, single or multiple Diagnosis Code(s):        --- Professional ---                           K29.70, Gastritis, unspecified, without bleeding                           K92.0, Hematemesis                           R11.2, Nausea with vomiting, unspecified CPT copyright 2016 [employer] Association. All rights reserved. The codes documented in this report are preliminary and upon coder review may  be revised to meet current compliance requirements. Rachael Fee, MD 04/13/2017 3:20:44 PM This report has been signed electronically. Number of Addenda: 0

## 2017-04-13 NOTE — Progress Notes (Signed)
Called to room pt c/o itching and hard to breath Vital signs 140/96 sats 100% room air heart rate 114.Encourage pt to take deep breaths and calm down,pt shaking and sitting on side of bed.Elray McgregorMary Lynch NP called see orders,rapid response called will continue to monitor

## 2017-04-13 NOTE — ED Notes (Signed)
Patient transported to ENDO via wheelchair. Signed consent with patient.

## 2017-04-14 DIAGNOSIS — K920 Hematemesis: Secondary | ICD-10-CM | POA: Diagnosis not present

## 2017-04-14 DIAGNOSIS — E669 Obesity, unspecified: Secondary | ICD-10-CM | POA: Diagnosis not present

## 2017-04-14 DIAGNOSIS — R197 Diarrhea, unspecified: Secondary | ICD-10-CM | POA: Diagnosis not present

## 2017-04-14 DIAGNOSIS — R112 Nausea with vomiting, unspecified: Secondary | ICD-10-CM | POA: Diagnosis not present

## 2017-04-14 DIAGNOSIS — F329 Major depressive disorder, single episode, unspecified: Secondary | ICD-10-CM | POA: Diagnosis not present

## 2017-04-14 DIAGNOSIS — K2990 Gastroduodenitis, unspecified, without bleeding: Secondary | ICD-10-CM | POA: Diagnosis not present

## 2017-04-14 DIAGNOSIS — K2970 Gastritis, unspecified, without bleeding: Secondary | ICD-10-CM | POA: Diagnosis not present

## 2017-04-14 DIAGNOSIS — K560 Paralytic ileus: Secondary | ICD-10-CM | POA: Diagnosis not present

## 2017-04-14 DIAGNOSIS — R1084 Generalized abdominal pain: Secondary | ICD-10-CM | POA: Diagnosis not present

## 2017-04-14 DIAGNOSIS — K226 Gastro-esophageal laceration-hemorrhage syndrome: Secondary | ICD-10-CM | POA: Diagnosis not present

## 2017-04-14 DIAGNOSIS — K210 Gastro-esophageal reflux disease with esophagitis: Secondary | ICD-10-CM | POA: Diagnosis not present

## 2017-04-14 DIAGNOSIS — K5651 Intestinal adhesions [bands], with partial obstruction: Secondary | ICD-10-CM | POA: Diagnosis not present

## 2017-04-14 DIAGNOSIS — F419 Anxiety disorder, unspecified: Secondary | ICD-10-CM | POA: Diagnosis not present

## 2017-04-14 LAB — GLUCOSE, CAPILLARY
GLUCOSE-CAPILLARY: 144 mg/dL — AB (ref 65–99)
GLUCOSE-CAPILLARY: 131 mg/dL — AB (ref 65–99)

## 2017-04-14 MED ORDER — AMITRIPTYLINE HCL 25 MG PO TABS: 25.0000 mg | ORAL_TABLET | Freq: Every day | ORAL | 0 refills | Status: AC

## 2017-04-14 MED ORDER — PANTOPRAZOLE SODIUM 40 MG PO TBEC: 40.0000 mg | DELAYED_RELEASE_TABLET | Freq: Two times a day (BID) | ORAL | 0 refills | Status: AC

## 2017-04-14 MED ORDER — GLIMEPIRIDE 1 MG PO TABS: 1.0000 mg | ORAL_TABLET | ORAL | 0 refills | Status: DC

## 2017-04-14 MED ORDER — DIPHENHYDRAMINE HCL 50 MG/ML IJ SOLN
25.0000 mg | Freq: Once | INTRAMUSCULAR | Status: AC
  Administered 2017-04-14: 25 mg via INTRAVENOUS
  Filled 2017-04-14: qty 1

## 2017-04-14 MED ORDER — BOOST PLUS PO LIQD
237.0000 mL | Freq: Two times a day (BID) | ORAL | Status: DC
  Filled 2017-04-14 (×3): qty 237

## 2017-04-14 MED ORDER — AMITRIPTYLINE HCL 25 MG PO TABS
25.0000 mg | ORAL_TABLET | Freq: Every day | ORAL | Status: DC
  Administered 2017-04-14: 25 mg via ORAL
  Filled 2017-04-14: qty 1

## 2017-04-14 NOTE — Progress Notes (Signed)
[facility] Progress Note  CC:  Nausea, vomiting, abdominal pain  Subjective:  Still feeling "rough".  Just vomited clear liquids in her basin.  Still with left sided abdominal pain.  Denies passing flatus, but continues to report watery diarrhea.  EGD on 12/7 showed the following:  - Mild non-specific gastritis, biopsied to check for H. pylori. - Normal examined duodenum. Biopsied to check for celiac sprue changes. - Slightly irregular but non-nodular Z line.  CT abdomen and pelvis was negative.  Objective:  Vital signs in last 24 hours: Temp:  [97.8 F (36.6 C)-98.5 F (36.9 C)] 98 F (36.7 C) (12/08 0604) Pulse Rate:  [99-140] 102 (12/08 0604) Resp:  [12-22] 20 (12/08 0604) BP: (102-145)/(60-99) 102/60 (12/08 0604) SpO2:  [95 %-100 %] 100 % (12/08 0604) Weight:  [215 lb (97.5 kg)-215 lb 6.2 oz (97.7 kg)] 215 lb 6.2 oz (97.7 kg) (12/08 0441) Last BM Date: 04/13/17 General:  Alert, Well-developed, in NAD Heart:  Slightly tachy but regular rhythm; no murmurs Pulm:  CTAB.  No increased WOB. Abdomen:  Soft, non-distended.  BS present but quiet and sparse.  TTP on the left. Extremities:  Without edema. Neurologic:  Alert and oriented x 4;  grossly normal neurologically. Psych:  Alert and cooperative. Normal mood and affect.  Lab Results: Recent Labs    04/13/17 0902  WBC 9.3  HGB 13.7  HCT 39.6  PLT 361   BMET Recent Labs    04/13/17 0902  NA 136  K 3.7  CL 105  CO2 18*  GLUCOSE 298*  BUN 10  CREATININE 0.67  CALCIUM 9.1   LFT Recent Labs    04/13/17 0902  PROT 6.6  ALBUMIN 3.9  AST 40  ALT 45  ALKPHOS 77  BILITOT 0.5   PT/INR Recent Labs    04/13/17 1122  LABPROT 11.5  INR 0.85   Ct Abdomen Pelvis W Contrast  Result Date: 04/14/2017 CLINICAL DATA:  Per ed notes: pt had reaction to iv contrast Per Pt, Pt is coming from home with complaints of vomiting blood that started this morning. Pt has hx of GI intermittent  obstructions. Reports vomiting x 2 days and excessive diarrhe EXAM: CT ABDOMEN AND PELVIS WITH CONTRAST TECHNIQUE: Multidetector CT imaging of the abdomen and pelvis was performed using the standard protocol following bolus administration of intravenous contrast. CONTRAST:  100mL ISOVUE-300 IOPAMIDOL (ISOVUE-300) INJECTION 61% - mild contrast reaction COMPARISON:  CT 03/08/2017 FINDINGS: Lower chest: Lung bases are clear. Hepatobiliary: No focal hepatic lesion. Postcholecystectomy. No biliary dilatation. Pancreas: Pancreas is normal. No ductal dilatation. No pancreatic inflammation. Spleen: Normal spleen Adrenals/urinary tract: Adrenal glands normal. Kidneys, ureters and bladder normal. Stomach/Bowel: Stomach, small-bowel cecum normal. Post appendectomy. The colon and rectosigmoid colon are normal. Vascular/Lymphatic: Abdominal aorta is normal caliber. There is no retroperitoneal or periportal lymphadenopathy. No pelvic lymphadenopathy. Reproductive: Uterus and ovaries normal.  IUD in expected location Other: No free fluid. Musculoskeletal: No aggressive osseous lesion. IMPRESSION: 1. Patient had a mild allergic reaction to IV contrast and was administered Benadryl for itching. 2. No acute findings in the abdomen pelvis. 3. Cholecystectomy and appendectomy anatomy. Electronically Signed   By: Genevive BiStewart  Edmunds M.D.   On: 04/14/2017 07:57   Dg Abdomen Acute W/chest  Result Date: 04/13/2017 CLINICAL DATA:  Abdominal pain.  Hematemesis.  Diarrhea. EXAM: DG ABDOMEN ACUTE W/ 1V CHEST COMPARISON:  CT 03/08/2017. FINDINGS: Mediastinum hilar structures normal. Mild basilar atelectasis. No pleural effusion or pneumothorax. Surgical clips  right upper quadrant. IUD noted the pelvis. Scattered air-fluid levels are noted throughout the small and large bowel. Diarrheal illness and/or mild adynamic ileus could present in this fashion. No free air. No acute bony abnormality. IMPRESSION: Scattered air-fluid levels noted  throughout the small large bowel. Diarrheal illness and/or mild adynamic ileus could present in this fashion. Electronically Signed   By: Maisie Fushomas  Register   On: 04/13/2017 10:54   Assessment / Plan: *Chronic nausea, vomiting, abdominal pain in a patient with history of small bowel obstructions requiring surgery in the past.  Symptoms worsening in the past couple of days and had episodes of hematemesis.  EGD on 12/7 showed mild non-specific gastritis.  CT scan is negative.  ? If she has gastroparesis.  Doubt that she can tolerate a GES now and she has been receiving IV pain meds so will be inaccurate.  Is allergic to reglan.  *Bloody diarrhea for several weeks:  Is scheduled for colonoscopy in January as an outpatient.  Would not tolerate prep for that now.    **Follow-up results of gastric biopsies. **Continue symptomatic management with anti-emetics, etc.  Try to limit narcotics.    LOS: 0 days   Princella PellegriniJessica D. Zehr  04/14/2017, 8:59 AM  Pager number [phone]  ________________________________________________________________________  [facility] MD note:  I personally examined the patient, reviewed the data and agree with the assessment and plan described above.  Her main complaint is abdominal pain and vomiting currently and these seem out of proportion to any physical finding or test results (normal CBC, CMET, normal CT abd/pelvis, essentially normal EGD).  Documented behavior in chart is unusual as well.  I am not planning any further GI testing for now. Biopsies from stomach and duodenum will be back on Monday.  She would not tolerate colonoscopy prep given her pain, vomiting currently.    Will follow along.   Rob Buntinganiel Jacobs, MD [facility] Pager [phone]

## 2017-04-14 NOTE — Progress Notes (Addendum)
Call Time 2243 Start Time 2250  Asked to see patient per primary RN. Upon arrival, patient was sitting on the side of bed, anxious, emotional, crying, and was complaining of abdominal pain. Patient denied chest pain but stated it hurt to take a deep breath because her abdomen was hurting, lung sounds were clear, good air movement bilaterally, skin was warm, eyes were read/slightly swollen (patient was and has been crying).  Patient stated she felt like this earlier when she had an allergic reaction to IV contrast during CT.   Per RN, VSS, patient was not on tele.   RN already paged NP, received orders for 1mg  Ativan IV. I also spoke with the NP twice, 1st time at 2315 informing that patient still feels like she is still reacting to the dye from earlier, NP wrote for SoluMedrol IV, which patient stated she did not want because she has DM and she does not want her blood sugars to be high.  Ativan helped with the patient's anxiety and she was calmer but still needed something for pain and itching, I offered her PO Benadryl and she said she is still having N/V, so RN paged NP and I spoke to NP again at 0008, updated her on the patient's status. IV Benadryl 25mg  x 1 to be given now and I asked that MD on campus come see this patient (patient is requiring morphine 4mg  Q3H IV and pain is still not controlled per patient, patient is requiring IV Phenergan and N/V is not controlled per patient, and patient is requiring IV Benadryl for itching, is not controlled per patient).  With the patient requiring multiple doses of IV morphine, IV phenergan,IV benadryl, and one dose of IV ativan, overall respiratory status is a concern as well, patient was placed on 2L [place] prior to my arrival and I asked the she be on telemetry as well.   Plan reviewed with RN and patient.   End Time 0010.  I called at 0026 for update, RN administered morphine and benadryl, patient tolerated.  I called at 216 for update, MD did see  patient.

## 2017-04-14 NOTE — Progress Notes (Signed)
Late Entry  Telemetry called last night and stated pt heart rate up in the 140"s,entered room pt stated she just threw up in the basin,I looked in the basin and it had 1200 cc gingerale colored liquid with no stomach contents,I went and ask the charge nurse to look at the liquid substance pt heat rate went back down to 100's  will continue to monitor

## 2017-04-14 NOTE — Progress Notes (Signed)
Pt requested her night time dose of seroquel 125 mg po after I handed her the medicine she quickly turned her back to me and faced the window and turned back around and gave me a empty med cup, nurse unable to see pt take med unsure if pt took med.Pt requested phenergan because she was throwing up,expained to pt it was not time for her IV phenergan it is schedule every 6 hours and her next dose is 0331.Explained to pt to let me know when she vomits the next time so I can see and chart her emesis for the doctor to be aware will continue to monitor

## 2017-04-14 NOTE — Progress Notes (Signed)
Loretta Alexander to be D/C'd Home per MD order.  Discussed prescriptions and follow up appointments with the patient. Prescriptions given to patient, medication list explained in detail. Pt verbalized understanding.  Allergies as of 04/14/2017      Reactions   Iodine Itching   Prochlorperazine Maleate Other (See Comments)   Patient can tolerate phenergan Major anxiety attack   Dilaudid [hydromorphone Hcl] Itching   Ok with benadryl   Compazine [prochlorperazine Maleate] Anxiety, Other (See Comments)   dizziness   Haloperidol And Related Anxiety, Other (See Comments)   dizziness   Reglan [metoclopramide Hcl] Anxiety, Other (See Comments)   dizziness   Toradol [ketorolac Tromethamine] Nausea And Vomiting      Medication List    STOP taking these medications   metFORMIN 500 MG 24 hr tablet Commonly known as:  GLUCOPHAGE-XR     TAKE these medications   amitriptyline 25 MG tablet Commonly known as:  ELAVIL Take 1 tablet (25 mg total) by mouth at bedtime.   DULoxetine 60 MG capsule Commonly known as:  CYMBALTA Take 60 mg by mouth daily.   EPIPEN 2-PAK 0.3 mg/0.3 mL Soaj injection Generic drug:  EPINEPHrine as needed.   famotidine 20 MG tablet Commonly known as:  PEPCID Take 1 tablet (20 mg total) by mouth 2 (two) times daily.   glimepiride 1 MG tablet Commonly known as:  AMARYL Take 1 tablet (1 mg total) by mouth every morning.   JUICE PLUS FIBRE PO Take 1 tablet by mouth daily.   levonorgestrel 20 MCG/24HR IUD Commonly known as:  MIRENA 1 each by Intrauterine route once. 2010   Na Sulfate-K Sulfate-Mg Sulf 17.5-3.13-1.6 GM/177ML Soln Take 1 kit as directed by mouth.   NON FORMULARY Take 1 tablet by mouth daily. JUICE PLUS FRUIT AND  VEGGIE FORMULA   pantoprazole 40 MG tablet Commonly known as:  PROTONIX Take 1 tablet (40 mg total) by mouth 2 (two) times daily before a meal. Can increase to twice a day as needed What changed:  when to take this   promethazine  12.5 MG tablet Commonly known as:  PHENERGAN Take 1 tablet (12.5 mg total) every 6 (six) hours as needed by mouth for nausea or vomiting.   QUEtiapine 50 MG tablet Commonly known as:  SEROQUEL Take 125 mg by mouth at bedtime.   tiZANidine 4 MG tablet Commonly known as:  ZANAFLEX Take 1 tablet 3 (three) times daily as needed by mouth.       Vitals:   04/13/17 2254 04/14/17 0604  BP: (!) 140/96 102/60  Pulse: (!) 114 (!) 102  Resp: (!) 21 20  Temp:  98 F (36.7 C)  SpO2: 100% 100%    Skin clean, dry and intact without evidence of skin break down, no evidence of skin tears noted. IV catheter discontinued intact. Site without signs and symptoms of complications. Dressing and pressure applied. Pt denies pain at this time. No complaints noted.  An After Visit Summary was printed and given to the patient. Patient escorted via [place], and D/C home via private auto.  Haywood Lasso BSN, RN [facility] Phone [phone]

## 2017-04-16 ENCOUNTER — Encounter (HOSPITAL_COMMUNITY): Payer: Self-pay | Admitting: Gastroenterology

## 2017-04-18 ENCOUNTER — Telehealth: Payer: Self-pay

## 2017-04-18 NOTE — Telephone Encounter (Signed)
Changed endocolon to just a colonoscopy. See note below. Patient still has pre-visit appointment scheduled.

## 2017-04-18 NOTE — Telephone Encounter (Signed)
Great - thanks

## 2017-04-18 NOTE — Telephone Encounter (Signed)
-----   Message from Leta BaptistJessica D Zehr, PA-C sent at 04/14/2017 10:29 AM EST ----- Patient is in the hospital and had EGD performed by Dr. Christella HartiganJacobs on 12/7.  She is currently scheduled for EGD and colonoscopy with Dr. Adela LankArmbruster in January.  Can you please change that to colonoscopy only as she will not need another EGD at that time?  Thank you,  Jess

## 2017-04-18 NOTE — Discharge Summary (Signed)
[facility] Discharge Summary   Patient: Loretta Alexander   PCP: Lois Huxley, [place] DOB: 1981/03/19   Date of admission: 04/13/2017   Date of discharge: 04/14/2017     Discharge Diagnoses:  Principal Problem:   Adynamic ileus ([place]) Active Problems:   Nausea with vomiting   Gastritis and gastroduodenitis   Depression   Obesity   Type 2 diabetes mellitus ([place])   Hematemesis with nausea   Mallory-Weiss tear   Admitted From: home Disposition:  home  Recommendations for Outpatient Follow-up:  1. Please follow-up with PCP in 1 week as well as GI as recommended. 2. Please follow small frequent meal diet for now until seen by GI.   Diet recommendation: Small frequent meals, gastro-paretic diet  Activity: The patient is advised to gradually reintroduce usual activities.  Discharge Condition: good  Code Status: Full code  History of present illness: As per the H and P dictated on admission, " Loretta Alexander is a 36 y.o. female with medical history significant  for partial SBO, GERD (esophagitis and gastritis per EGD in 2012), multiple abdominal surgeries (appendectomy, cholecystectomy, right oophorectomy, adhesion takedown, gastric banding,andpartial colonicresection), type 2 diabetes mellitus, depression, anxiety, obesity and a recent admission in August 2018 for possible anaphylaxis who presented to the ED with hematemesis x 4 (documented with photos on her cell phone). This AMshewoke up with N/V and severe retching, subsequently had 4 episodes of hematemesis.For the past 12 weeks she has had daily N/V and diarrhea. This is the first time she has had hematemesis. She states she sees [facility] for work-up of chronic intermittent bowel obstructions.She denies excessive use of NSAIDs, history of liver dysfunction.  Multiple CT scans in the past and would prefer we avoid one.  SOB typically presented with intractable N/V and either diarrhea or constipation.  Family  history of stomach cancer in her mother and colon cancer in her grandparents  Family history of Cilaec disease, no history of IBD Stools are loose and appear fatty, has had blood in her stool before  Recent stool studies negative for infectious cause   Per review of her record GI  plans EGD and colonoscopy in January, testing for celiac disease, and gastric emptying study if negative EGD. If gastric emptying study is negative they would recommend MR enterography.   ED Course: White blood cell count 13.7 with tachycardia.  She is not actually orthostatic as noted by the ED physician she has negative orthostatics per sitting and standing criteria.  She does have a history of gastroesophageal reflux disease with esophagitis and gastritis takes Protonix twice daily.  Given conversation with GI it is likely she has a Mallory-Weiss tear and admission to the hospital with consultation to GI has been requested."  Hospital Course:  Summary of her active problems in the hospital is as following. Principal Problem:   Adynamic ileus (HCC)   Nausea with vomiting   Gastritis and gastroduodenitis   Depression Presented with complaints of multiple episodes of nausea and vomiting followed by a few episodes of hematemesis.  Underwent EGD which showed mild gastritis without any active bleeding.  On presentation serum creatinine and BUN electrolytes all were normal.  No evidence of hemoconcentration.  No leukocytosis as well. EGD as above unremarkable. Patient continues to complain about having severe abdominal pain as well as nausea and vomiting although none of them were both witnessed. Requesting IV morphine for pain control. On discussion with the patient, I informed that the CT  scan of the abdomen was also not showing any evidence of acute intra-abdominal pathology. She may have a gastric motility disorder for which I recommended small frequent meals to the patient as well as recommended to avoid  narcotics from here onwards. Otherwise this may be irritable bowel syndrome which will be a diagnosis of exclusion, patient will follow up with GI for the same. Recommended that she may benefit from a psychotropic medication as well as psychiatric evaluation as this may be a somatization disorder that she is not aware of. Starting the patient on amitriptyline for intractable abdominal pain as well as adding her depression regimen.  History of depression. Patient is on Seroquel, Cymbalta at home. Added amitriptyline and recommend outpatient establishing care with psychiatry. For now discharging on twice a day PPI. Patient was able to tolerate oral diet and did not have any further episode of vomiting in the hospital.    Obesity No significant weight loss actively seen despite patient's frequent admissions with abdominal pain as well as multiple nausea and vomiting complaint.    Type 2 diabetes mellitus ([place]) Patient is on metformin at home, and some event metformin will cause severe GI side effects. Recommend patient to stop taking metformin and start taking Amaryl. Ideally patient will benefit from being on medications like Invokana but recommend PCP to further discuss this with patient.  All other chronic medical condition were stable during the hospitalization.  Patient was ambulatory without any assistance. On the day of the discharge the patient's vitals were stable, and no other acute medical condition were reported by patient. the patient was felt safe to be discharge at home with family.  Procedures and Results:  EGD   Consultations:  Gastroenterology  DISCHARGE MEDICATION: Allergies as of 04/14/2017      Reactions   Iodine Itching   Prochlorperazine Maleate Other (See Comments)   Patient can tolerate phenergan Major anxiety attack   Dilaudid [hydromorphone Hcl] Itching   Ok with benadryl   Compazine [prochlorperazine Maleate] Anxiety, Other (See Comments)   dizziness     Haloperidol And Related Anxiety, Other (See Comments)   dizziness   Reglan [metoclopramide Hcl] Anxiety, Other (See Comments)   dizziness   Toradol [ketorolac Tromethamine] Nausea And Vomiting      Medication List    STOP taking these medications   metFORMIN 500 MG 24 hr tablet Commonly known as:  GLUCOPHAGE-XR     TAKE these medications   amitriptyline 25 MG tablet Commonly known as:  ELAVIL Take 1 tablet (25 mg total) by mouth at bedtime.   DULoxetine 60 MG capsule Commonly known as:  CYMBALTA Take 60 mg by mouth daily.   EPIPEN 2-PAK 0.3 mg/0.3 mL Soaj injection Generic drug:  EPINEPHrine as needed.   famotidine 20 MG tablet Commonly known as:  PEPCID Take 1 tablet (20 mg total) by mouth 2 (two) times daily.   glimepiride 1 MG tablet Commonly known as:  AMARYL Take 1 tablet (1 mg total) by mouth every morning.   JUICE PLUS FIBRE PO Take 1 tablet by mouth daily.   levonorgestrel 20 MCG/24HR IUD Commonly known as:  MIRENA 1 each by Intrauterine route once. 2010   Na Sulfate-K Sulfate-Mg Sulf 17.5-3.13-1.6 GM/177ML Soln Take 1 kit as directed by mouth.   NON FORMULARY Take 1 tablet by mouth daily. JUICE PLUS FRUIT AND  VEGGIE FORMULA   pantoprazole 40 MG tablet Commonly known as:  PROTONIX Take 1 tablet (40 mg total) by mouth 2 (two)  times daily before a meal. Can increase to twice a day as needed What changed:  when to take this   promethazine 12.5 MG tablet Commonly known as:  PHENERGAN Take 1 tablet (12.5 mg total) every 6 (six) hours as needed by mouth for nausea or vomiting.   QUEtiapine 50 MG tablet Commonly known as:  SEROQUEL Take 125 mg by mouth at bedtime.   tiZANidine 4 MG tablet Commonly known as:  ZANAFLEX Take 1 tablet 3 (three) times daily as needed by mouth.      Allergies  Allergen Reactions  . Iodine Itching  . Prochlorperazine Maleate Other (See Comments)    Patient can tolerate phenergan Major anxiety attack  . Dilaudid  [Hydromorphone Hcl] Itching    Ok with benadryl  . Compazine [Prochlorperazine Maleate] Anxiety and Other (See Comments)    dizziness  . Haloperidol And Related Anxiety and Other (See Comments)    dizziness  . Reglan [Metoclopramide Hcl] Anxiety and Other (See Comments)    dizziness  . Toradol [Ketorolac Tromethamine] Nausea And Vomiting   Discharge Instructions    Diet - low sodium heart healthy   Complete by:  As directed    Discharge instructions   Complete by:  As directed    It is important that you read following instructions as well as go over your medication list with RN to help you understand your care after this hospitalization.  Discharge Instructions: Please follow-up with PCP in one week  Please request your primary care physician to go over all Hospital Tests and Procedure/Radiological results at the follow up,  Please get all Hospital records sent to your PCP by signing hospital release before you go home.   Do not take more than prescribed Pain, Sleep and Anxiety Medications. You were cared for by a hospitalist during your hospital stay. If you have any questions about your discharge medications or the care you received while you were in the hospital after you are discharged, you can call the unit and ask to speak with the hospitalist on call if the hospitalist that took care of you is not available.  Once you are discharged, your primary care physician will handle any further medical issues. Please note that NO REFILLS for any discharge medications will be authorized once you are discharged, as it is imperative that you return to your primary care physician (or establish a relationship with a primary care physician if you do not have one) for your aftercare needs so that they can reassess your need for medications and monitor your lab values. You Must read complete instructions/literature along with all the possible adverse reactions/side effects for all the Medicines you  take and that have been prescribed to you. Take any new Medicines after you have completely understood and accept all the possible adverse reactions/side effects. Wear Seat belts while driving. If you have smoked or chewed Tobacco in the last 2 yrs please stop smoking and/or stop any Recreational drug use.   Increase activity slowly   Complete by:  As directed      Discharge Exam: Filed Weights   04/13/17 1419 04/13/17 2106 04/14/17 0441  Weight: 97.5 kg (215 lb) 97.7 kg (215 lb 6.2 oz) 97.7 kg (215 lb 6.2 oz)   Vitals:   04/13/17 2254 04/14/17 0604  BP: (!) 140/96 102/60  Pulse: (!) 114 (!) 102  Resp: (!) 21 20  Temp:  98 F (36.7 C)  SpO2: 100% 100%   General: Appear in no  distress, no Rash; Oral Mucosa moist. Cardiovascular: S1 and S2 Present, no Murmur, no JVD Respiratory: Bilateral Air entry present and Clear to Auscultation, no Crackles, no wheezes Abdomen: Bowel Sound present, Soft and no tenderness Extremities: no Pedal edema, no calf tenderness Neurology: Grossly no focal neuro deficit.  The results of significant diagnostics from this hospitalization (including imaging, microbiology, ancillary and laboratory) are listed below for reference.    Significant Diagnostic Studies: Mr Cervical Spine Wo Contrast  Result Date: 03/27/2017 CLINICAL DATA:  36 year old female with intermittent chronic cervical neck pain. Prior surgery in 2016. Pain radiating to the left shoulder and arm with weakness and numbness for several months. EXAM: MRI CERVICAL SPINE WITHOUT CONTRAST TECHNIQUE: Multiplanar, multisequence MR imaging of the cervical spine was performed. No intravenous contrast was administered. COMPARISON:  Cervical spine postoperative radiograph 08/31/2015 outside. Preoperative cervical spine MRI 03/24/2015. FINDINGS: Alignment: Overall satisfactory vertebral height and alignment, stable to improved compared to the preoperative study - with resolved mild kyphosis at C5-C6 seen in  2016. Vertebrae: Hardware susceptibility artifact related the C5-C6 and C6-C7 ACDF hardware. Underlying bone marrow signal within normal limits. No marrow edema or evidence of acute osseous abnormality. Cord: Spinal cord signal is within normal limits at all visualized levels. Posterior Fossa, vertebral arteries, paraspinal tissues: Negative visualized brain parenchyma. Incidental mucous retention cyst in the right maxillary sinus. Preserved major vascular flow voids in the neck. Marginal retropharyngeal course of the carotid arteries. Negative neck soft tissues. Negative visible lung apices. Disc levels: C2-C3:  Mild facet hypertrophy greater on the left.  No stenosis. C3-C4: Borderline to mild facet hypertrophy greater on the left. No stenosis. C4-C5: Minimal disc bulge and foraminal endplate spurring greater on the left. No stenosis. C5-C6: Prior ACDF. Improved thecal sac patency compared to the preoperative MRI and no stenosis. C6-C7:  Prior ACDF.  No stenosis. C7-T1:  Mild facet hypertrophy.  No stenosis. No upper thoracic spinal or foraminal stenosis. IMPRESSION: 1. Status post prior C5-C6 and C6-C7 ACDF with no adverse features and no cervical neural impingement. 2. Mild intermittent cervical facet hypertrophy, greater on the left. No acute osseous abnormality identified. Electronically Signed   By: Genevie Ann M.D.   On: 03/27/2017 15:25   Ct Abdomen Pelvis W Contrast  Result Date: 04/14/2017 CLINICAL DATA:  Per ed notes: pt had reaction to iv contrast Per Pt, Pt is coming from home with complaints of vomiting blood that started this morning. Pt has hx of GI intermittent obstructions. Reports vomiting x 2 days and excessive diarrhe EXAM: CT ABDOMEN AND PELVIS WITH CONTRAST TECHNIQUE: Multidetector CT imaging of the abdomen and pelvis was performed using the standard protocol following bolus administration of intravenous contrast. CONTRAST:  115m ISOVUE-300 IOPAMIDOL (ISOVUE-300) INJECTION 61% - mild  contrast reaction COMPARISON:  CT 03/08/2017 FINDINGS: Lower chest: Lung bases are clear. Hepatobiliary: No focal hepatic lesion. Postcholecystectomy. No biliary dilatation. Pancreas: Pancreas is normal. No ductal dilatation. No pancreatic inflammation. Spleen: Normal spleen Adrenals/urinary tract: Adrenal glands normal. Kidneys, ureters and bladder normal. Stomach/Bowel: Stomach, small-bowel cecum normal. Post appendectomy. The colon and rectosigmoid colon are normal. Vascular/Lymphatic: Abdominal aorta is normal caliber. There is no retroperitoneal or periportal lymphadenopathy. No pelvic lymphadenopathy. Reproductive: Uterus and ovaries normal.  IUD in expected location Other: No free fluid. Musculoskeletal: No aggressive osseous lesion. IMPRESSION: 1. Patient had a mild allergic reaction to IV contrast and was administered Benadryl for itching. 2. No acute findings in the abdomen pelvis. 3. Cholecystectomy and appendectomy anatomy. Electronically Signed  By: Suzy Bouchard M.D.   On: 04/14/2017 07:57   Dg Abdomen Acute W/chest  Result Date: 04/13/2017 CLINICAL DATA:  Abdominal pain.  Hematemesis.  Diarrhea. EXAM: DG ABDOMEN ACUTE W/ 1V CHEST COMPARISON:  CT 03/08/2017. FINDINGS: Mediastinum hilar structures normal. Mild basilar atelectasis. No pleural effusion or pneumothorax. Surgical clips right upper quadrant. IUD noted the pelvis. Scattered air-fluid levels are noted throughout the small and large bowel. Diarrheal illness and/or mild adynamic ileus could present in this fashion. No free air. No acute bony abnormality. IMPRESSION: Scattered air-fluid levels noted throughout the small large bowel. Diarrheal illness and/or mild adynamic ileus could present in this fashion. Electronically Signed   By: Marcello Moores  Register   On: 04/13/2017 10:54    Microbiology: No results found for this or any previous visit (from the past 240 hour(s)).   Labs: CBC: Recent Labs  Lab 04/13/17 0902  WBC 9.3  HGB  13.7  HCT 39.6  MCV 89.2  PLT 015   Basic Metabolic Panel: Recent Labs  Lab 04/13/17 0902  NA 136  K 3.7  CL 105  CO2 18*  GLUCOSE 298*  BUN 10  CREATININE 0.67  CALCIUM 9.1   Liver Function Tests: Recent Labs  Lab 04/13/17 0902  AST 40  ALT 45  ALKPHOS 77  BILITOT 0.5  PROT 6.6  ALBUMIN 3.9   Recent Labs  Lab 04/13/17 0902  LIPASE 33   No results for input(s): AMMONIA in the last 168 hours. Cardiac Enzymes: No results for input(s): CKTOTAL, CKMB, CKMBINDEX, TROPONINI in the last 168 hours. BNP (last 3 results) No results for input(s): BNP in the last 8760 hours. CBG: Recent Labs  Lab 04/13/17 1615 04/13/17 2103 04/14/17 0740 04/14/17 1149  GLUCAP 109* 144* 144* 131*   Time spent: 35 minutes  Signed:  Berle Mull  [facility] 04/14/2017   , 6:56 PM

## 2017-04-22 ENCOUNTER — Telehealth: Payer: Self-pay | Admitting: Gastroenterology

## 2017-04-22 NOTE — Telephone Encounter (Signed)
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 Recently admitted for worsening nausea / vomiting, EGD did not show much. Her symptoms have persisted.   Raynelle FanningJulie, she is scheduled to see me for colonoscopy in January. I'd like to order a gastric emptying study in the interim while we are waiting on that. Can you call her and help order this? Thanks

## 2017-04-23 ENCOUNTER — Other Ambulatory Visit: Payer: Self-pay

## 2017-04-23 DIAGNOSIS — R112 Nausea with vomiting, unspecified: Secondary | ICD-10-CM

## 2017-04-23 NOTE — Telephone Encounter (Signed)
Left message for Loretta Alexander at radiology scheduling that patient needs gastric emptying study done prior to colonoscopy on 05/18/17. Called and lvm for patient that she should expect a call to schedule this test in next day or two. Also let her know to contact the schedulers if she has not heard from them.

## 2017-04-26 MED FILL — METFORMIN HCL ER 500 MG TAB: 500 | 90 days supply | Qty: 360 | Fill #0

## 2017-04-27 ENCOUNTER — Ambulatory Visit (HOSPITAL_COMMUNITY)
Admission: RE | Admit: 2017-04-27 | Discharge: 2017-04-27 | Disposition: A | Discharge status: Home / Self Care | Payer: 59 | Source: Ambulatory Visit | Attending: Gastroenterology | Admitting: Gastroenterology

## 2017-04-27 DIAGNOSIS — R112 Nausea with vomiting, unspecified: Secondary | ICD-10-CM | POA: Insufficient documentation

## 2017-04-27 MED ORDER — TECHNETIUM TC 99M SULFUR COLLOID
2.0000 | Freq: Once | INTRAVENOUS | Status: AC | PRN
  Administered 2017-04-27: 2 via ORAL

## 2017-04-27 MED FILL — AMITRIPTYLINE HCL 25 MG TAB: 25 | 30 days supply | Qty: 30 | Fill #0

## 2017-04-27 MED FILL — GLIMEPIRIDE 1 MG TABLET: 1 | 30 days supply | Qty: 30 | Fill #0

## 2017-04-30 ENCOUNTER — Other Ambulatory Visit: Payer: Self-pay

## 2017-04-30 MED ORDER — ONDANSETRON HCL 4 MG PO TABS
4.0000 mg | ORAL_TABLET | Freq: Three times a day (TID) | ORAL | 1 refills | Status: DC | PRN
Start: 2017-04-30 — End: 2019-01-30

## 2017-05-09 MED FILL — tiZANidine HCL 4 MG TABS: 4 | 30 days supply | Qty: 90 | Fill #0

## 2017-05-11 ENCOUNTER — Other Ambulatory Visit: Payer: Self-pay

## 2017-05-11 ENCOUNTER — Ambulatory Visit (AMBULATORY_SURGERY_CENTER): Payer: Self-pay | Admitting: *Deleted

## 2017-05-11 ENCOUNTER — Telehealth: Payer: Self-pay | Admitting: *Deleted

## 2017-05-11 VITALS — Ht 62.0 in | Wt 234.0 lb

## 2017-05-11 DIAGNOSIS — R194 Change in bowel habit: Secondary | ICD-10-CM

## 2017-05-11 MED ORDER — NA SULFATE-K SULFATE-MG SULF 17.5-3.13-1.6 GM/177ML PO SOLN: 1.0000 | Freq: Once | ORAL | 0 refills | Status: AC

## 2017-05-11 MED FILL — SUPREP BOWEL PREP KIT: 17.5-3.13-1 | 1 days supply | Qty: 354 | Fill #0

## 2017-05-11 NOTE — Telephone Encounter (Signed)
Will proceed as scheduled. Loretta Alexander  

## 2017-05-11 NOTE — Progress Notes (Signed)
PT IS A VERY DIFFICULT IV STICK!!!!!!! - she asked about coming to the [facility] early Friday 1-11 to give more time for stick- Per Dorene GrebeNatalie, come at 1 pm- pt aware No egg or soy allergy known to patient  No issues with past sedation with any surgeries  or procedures, no intubation problems  No diet pills per patient No home 02 use per patient  No blood thinners per patient  Pt denies issues with constipation  No A fib or A flutter  EMMI video sent to pt's e mail - PT DECLINED  suprep [facility] 17.18 [facility] - pt aware

## 2017-05-11 NOTE — Telephone Encounter (Signed)
Thanks for letting me know. I think we should try it here if possible if she has had success with the anti-cub location in the past. I don't have any openings at the hospital for some time and she is in need of this procedure as soon as possible. Thanks

## 2017-05-11 NOTE — Telephone Encounter (Signed)
Dr Adela LankArmbruster,  I saw this patient in [facility] today- she states she is a VERY difficult IV stick - she was telling me about having to have PICC lines for procedures - she states they normally have to use the US machine to get an IV-  I asked her where is good vein, she stated it depends. She said the IV team has occasionally gotten a stick in her upper arms, occ anticub.  We discussed the importance of increased fluid intake. She is scheduled for Friday 05-18-17 to arrive 130 pm for a 230 pm colon. I did have her come at 1 pm just to give us a little more time.  Selinda MichaelsLinda Hunt RN is not here Friday unfortunately.     I just want to make sure you think she is okay to have her procedure here in the [facility] due to her difficulties or do you think she needs to have this done at hospital due to her IV difficulty.   Please advise, Thanks, Elizebeth BrookingMarie [facility]

## 2017-05-18 ENCOUNTER — Encounter: Payer: 59 | Admitting: Gastroenterology

## 2017-05-18 ENCOUNTER — Encounter: Payer: Self-pay | Admitting: Gastroenterology

## 2017-05-18 ENCOUNTER — Ambulatory Visit (AMBULATORY_SURGERY_CENTER): Payer: 59 | Admitting: Gastroenterology

## 2017-05-18 ENCOUNTER — Other Ambulatory Visit: Payer: Self-pay

## 2017-05-18 VITALS — BP 129/86 | HR 77 | Temp 97.7°F | Resp 12 | Ht 62.0 in | Wt 235.0 lb

## 2017-05-18 DIAGNOSIS — R194 Change in bowel habit: Secondary | ICD-10-CM | POA: Diagnosis present

## 2017-05-18 DIAGNOSIS — Z1211 Encounter for screening for malignant neoplasm of colon: Secondary | ICD-10-CM | POA: Diagnosis not present

## 2017-05-18 MED ORDER — SODIUM CHLORIDE 0.9 % IV SOLN: 500.0000 mL | Freq: Once | INTRAVENOUS | Status: AC

## 2017-05-18 NOTE — Progress Notes (Signed)
Pt's states no medical or surgical changes since previsit or office visit. 

## 2017-05-18 NOTE — Op Note (Signed)
[facility] Patient Name: Loretta Alexander Procedure Date: 05/18/2017 2:30 PM MRN: [id number] Endoscopist: Viviann Spare P. Armbruster MD, MD Age: 37 Referring MD:  Date of Birth: [birth year] Gender: Female Account #: [id number] Procedure:                Colonoscopy Indications:              Clinically significant diarrhea of unexplained                            origin Medicines:                Monitored Anesthesia Care Procedure:                Pre-Anesthesia Assessment:                           - Prior to the procedure, a History and Physical                            was performed, and patient medications and                            allergies were reviewed. The patient's tolerance of                            previous anesthesia was also reviewed. The risks                            and benefits of the procedure and the sedation                            options and risks were discussed with the patient.                            All questions were answered, and informed consent                            was obtained. Prior Anticoagulants: The patient has                            taken no previous anticoagulant or antiplatelet                            agents. ASA Grade Assessment: II - A patient with                            mild systemic disease. After reviewing the risks                            and benefits, the patient was deemed in                            satisfactory condition to undergo the procedure.  After obtaining informed consent, the colonoscope                            was passed under direct vision. Throughout the                            procedure, the patient's blood pressure, pulse, and                            oxygen saturations were monitored continuously. The                            Colonoscope was introduced through the anus and                            advanced to the the terminal ileum, with                  identification of the appendiceal orifice and IC                            valve. The colonoscopy was technically difficult                            and complex due to inadequate bowel prep. The                            patient tolerated the procedure well. The quality                            of the bowel preparation was inadequate. The                            terminal ileum, ileocecal valve, appendiceal                            orifice, and rectum were photographed. Scope In: 2:38:47 PM Scope Out: 2:57:05 PM Scope Withdrawal Time: 0 hours 8 minutes 27 seconds  Total Procedure Duration: 0 hours 18 minutes 18 seconds  Findings:                 The perianal and digital rectal examinations were                            normal.                           Limited views of the terminal ileum appeared normal.                           A large amount of semi-liquid stool was found in                            the entire colon, making visualization difficult.  Lavage of the area was performed using copious                            amounts of sterile water, resulting in incomplete                            clearance with fair visualization. Worst areas                            affected were dependant portions and left side of                            the colon. Cecum could also not be cleared                           The colon was tortous. The exam was otherwise                            without abnormality. No inflammatory changes                            appreciated. Polyps may not have been appreciated                            given the preparation for this exam.                           Biopsies for histology were taken with a cold                            forceps from the right colon, left colon and                            transverse colon for evaluation of microscopic                            colitis. Complications:             No immediate complications. Estimated blood loss:                            Minimal. Estimated Blood Loss:     Estimated blood loss was minimal. Impression:               - Preparation of the colon was inadequate, polyps                            may not have been appreciated if present on this                            exam.                           - The examined portion of the ileum was normal.                           -  Of the visualized colon, no inflammatory changes,                            polyps, or mass lesions.                           - Biopsies were taken with a cold forceps from the                            right colon, left colon and transverse colon for                            evaluation of microscopic colitis. Recommendation:           - Patient has a contact number available for                            emergencies. The signs and symptoms of potential                            delayed complications were discussed with the                            patient. Return to normal activities tomorrow.                            Written discharge instructions were provided to the                            patient.                           - Resume previous diet.                           - Continue present medications.                           - Await pathology results. Viviann SpareSteven P. Armbruster MD, MD 05/18/2017 3:02:55 PM This report has been signed electronically.

## 2017-05-18 NOTE — Patient Instructions (Signed)
Impression/Recommendations:  Resume previous diet. Continue present medications.  Await pathology results.  YOU HAD AN ENDOSCOPIC PROCEDURE TODAY AT THE [facility]:   Refer to the procedure report that was given to you for any specific questions about what was found during the examination.  If the procedure report does not answer your questions, please call your gastroenterologist to clarify.  If you requested that your care partner not be given the details of your procedure findings, then the procedure report has been included in a sealed envelope for you to review at your convenience later.  YOU SHOULD EXPECT: Some feelings of bloating in the abdomen. Passage of more gas than usual.  Walking can help get rid of the air that was put into your GI tract during the procedure and reduce the bloating. If you had a lower endoscopy (such as a colonoscopy or flexible sigmoidoscopy) you may notice spotting of blood in your stool or on the toilet paper. If you underwent a bowel prep for your procedure, you may not have a normal bowel movement for a few days.  Please Note:  You might notice some irritation and congestion in your nose or some drainage.  This is from the oxygen used during your procedure.  There is no need for concern and it should clear up in a day or so.  SYMPTOMS TO REPORT IMMEDIATELY:   Following lower endoscopy (colonoscopy or flexible sigmoidoscopy):  Excessive amounts of blood in the stool  Significant tenderness or worsening of abdominal pains  Swelling of the abdomen that is new, acute  Fever of 100F or higher For urgent or emergent issues, a gastroenterologist can be reached at any hour by calling [phone].   DIET:  We do recommend a small meal at first, but then you may proceed to your regular diet.  Drink plenty of fluids but you should avoid alcoholic beverages for 24 hours.  ACTIVITY:  You should plan to take it easy for the rest of today and you  should NOT DRIVE or use heavy machinery until tomorrow (because of the sedation medicines used during the test).    FOLLOW UP: Our staff will call the number listed on your records the next business day following your procedure to check on you and address any questions or concerns that you may have regarding the information given to you following your procedure. If we do not reach you, we will leave a message.  However, if you are feeling well and you are not experiencing any problems, there is no need to return our call.  We will assume that you have returned to your regular daily activities without incident.  If any biopsies were taken you will be contacted by phone or by letter within the next 1-3 weeks.  Please call us at [phone] if you have not heard about the biopsies in 3 weeks.    SIGNATURES/CONFIDENTIALITY: You and/or your care partner have signed paperwork which will be entered into your electronic medical record.  These signatures attest to the fact that that the information above on your After Visit Summary has been reviewed and is understood.  Full responsibility of the confidentiality of this discharge information lies with you and/or your care-partner.

## 2017-05-18 NOTE — Progress Notes (Signed)
Called to room to assist during endoscopic procedure.  Patient ID and intended procedure confirmed with present staff. Received instructions for my participation in the procedure from the performing physician.  

## 2017-05-18 NOTE — Progress Notes (Signed)
To PACU, VSS. Report to [email or web address] 

## 2017-05-21 ENCOUNTER — Telehealth: Payer: Self-pay

## 2017-05-21 NOTE — Telephone Encounter (Signed)
Unable to leave message did not give permission.

## 2017-05-28 ENCOUNTER — Other Ambulatory Visit: Payer: Self-pay

## 2017-05-28 MED ORDER — COLESTIPOL HCL 1 G PO TABS: 1.0000 g | ORAL_TABLET | Freq: Two times a day (BID) | ORAL | 1 refills | Status: DC

## 2017-05-28 NOTE — Progress Notes (Signed)
Sending script for Colestid 1g, BID per Dr. Mervyn SkeetersA. See result note from colon

## 2017-07-04 DIAGNOSIS — R6889 Other general symptoms and signs: Secondary | ICD-10-CM | POA: Diagnosis not present

## 2017-07-04 MED FILL — QUETIAPINE FUMARATE 50 MG T: 50 | 90 days supply | Qty: 270 | Fill #0

## 2017-07-04 MED FILL — tiZANidine HCL 4 MG TABS: 4 | 30 days supply | Qty: 90 | Fill #0

## 2017-07-04 MED FILL — DULoxetine HCL 60 MG CPEP: 60 | 90 days supply | Qty: 90 | Fill #0

## 2017-07-06 DIAGNOSIS — F329 Major depressive disorder, single episode, unspecified: Secondary | ICD-10-CM | POA: Diagnosis not present

## 2017-07-06 DIAGNOSIS — G4700 Insomnia, unspecified: Secondary | ICD-10-CM | POA: Diagnosis not present

## 2017-07-06 DIAGNOSIS — Z13220 Encounter for screening for lipoid disorders: Secondary | ICD-10-CM | POA: Diagnosis not present

## 2017-07-06 DIAGNOSIS — G43909 Migraine, unspecified, not intractable, without status migrainosus: Secondary | ICD-10-CM | POA: Diagnosis not present

## 2017-07-06 DIAGNOSIS — E1165 Type 2 diabetes mellitus with hyperglycemia: Secondary | ICD-10-CM | POA: Diagnosis not present

## 2017-07-06 DIAGNOSIS — Z7984 Long term (current) use of oral hypoglycemic drugs: Secondary | ICD-10-CM | POA: Diagnosis not present

## 2017-07-06 DIAGNOSIS — G44209 Tension-type headache, unspecified, not intractable: Secondary | ICD-10-CM | POA: Diagnosis not present

## 2017-07-06 DIAGNOSIS — Z8719 Personal history of other diseases of the digestive system: Secondary | ICD-10-CM | POA: Diagnosis not present

## 2017-07-06 MED FILL — AMITRIPTYLINE HCL 25 MG TAB: 25 | 90 days supply | Qty: 90 | Fill #0 | Status: TO

## 2017-07-11 MED FILL — JARDIANCE 10 MG TABLET: 10 | 30 days supply | Qty: 30 | Fill #0 | Status: TO

## 2017-07-18 DIAGNOSIS — K5900 Constipation, unspecified: Secondary | ICD-10-CM | POA: Diagnosis not present

## 2017-07-18 DIAGNOSIS — R1012 Left upper quadrant pain: Secondary | ICD-10-CM | POA: Diagnosis not present

## 2017-07-18 DIAGNOSIS — Z6841 Body Mass Index (BMI) 40.0 and over, adult: Secondary | ICD-10-CM | POA: Diagnosis not present

## 2017-07-18 DIAGNOSIS — Z7984 Long term (current) use of oral hypoglycemic drugs: Secondary | ICD-10-CM | POA: Diagnosis not present

## 2017-07-18 DIAGNOSIS — R1013 Epigastric pain: Secondary | ICD-10-CM | POA: Diagnosis not present

## 2017-07-18 DIAGNOSIS — Z79899 Other long term (current) drug therapy: Secondary | ICD-10-CM | POA: Diagnosis not present

## 2017-07-18 DIAGNOSIS — R509 Fever, unspecified: Secondary | ICD-10-CM | POA: Diagnosis not present

## 2017-07-18 DIAGNOSIS — R1110 Vomiting, unspecified: Secondary | ICD-10-CM | POA: Diagnosis not present

## 2018-09-20 ENCOUNTER — Emergency Department (HOSPITAL_COMMUNITY)
Admission: EM | Admit: 2018-09-20 | Discharge: 2018-09-20 | Disposition: A | Discharge status: Home / Self Care | Payer: BLUE CROSS/BLUE SHIELD | Attending: Emergency Medicine | Admitting: Emergency Medicine

## 2018-09-20 ENCOUNTER — Emergency Department (HOSPITAL_COMMUNITY): Payer: BLUE CROSS/BLUE SHIELD

## 2018-09-20 ENCOUNTER — Other Ambulatory Visit: Payer: Self-pay

## 2018-09-20 ENCOUNTER — Encounter (HOSPITAL_COMMUNITY): Payer: Self-pay | Admitting: Emergency Medicine

## 2018-09-20 DIAGNOSIS — R1013 Epigastric pain: Secondary | ICD-10-CM | POA: Diagnosis present

## 2018-09-20 DIAGNOSIS — J181 Lobar pneumonia, unspecified organism: Secondary | ICD-10-CM | POA: Insufficient documentation

## 2018-09-20 DIAGNOSIS — Z79899 Other long term (current) drug therapy: Secondary | ICD-10-CM | POA: Diagnosis not present

## 2018-09-20 DIAGNOSIS — J189 Pneumonia, unspecified organism: Secondary | ICD-10-CM

## 2018-09-20 DIAGNOSIS — R112 Nausea with vomiting, unspecified: Secondary | ICD-10-CM | POA: Insufficient documentation

## 2018-09-20 DIAGNOSIS — Z7984 Long term (current) use of oral hypoglycemic drugs: Secondary | ICD-10-CM | POA: Insufficient documentation

## 2018-09-20 DIAGNOSIS — R197 Diarrhea, unspecified: Secondary | ICD-10-CM | POA: Diagnosis not present

## 2018-09-20 DIAGNOSIS — E119 Type 2 diabetes mellitus without complications: Secondary | ICD-10-CM | POA: Insufficient documentation

## 2018-09-20 LAB — CBC WITH DIFFERENTIAL/PLATELET
Abs Immature Granulocytes: 0.11 10*3/uL — ABNORMAL HIGH (ref 0.00–0.07)
Basophils Absolute: 0.1 10*3/uL (ref 0.0–0.1)
Basophils Relative: 1 %
Eosinophils Absolute: 0.1 10*3/uL (ref 0.0–0.5)
Eosinophils Relative: 1 %
HCT: 42.8 % (ref 36.0–46.0)
Hemoglobin: 14.1 g/dL (ref 12.0–15.0)
Immature Granulocytes: 1 %
Lymphocytes Relative: 18 %
Lymphs Abs: 2.3 10*3/uL (ref 0.7–4.0)
MCH: 30.4 pg (ref 26.0–34.0)
MCHC: 32.9 g/dL (ref 30.0–36.0)
MCV: 92.2 fL (ref 80.0–100.0)
Monocytes Absolute: 0.6 10*3/uL (ref 0.1–1.0)
Monocytes Relative: 5 %
Neutro Abs: 9.8 10*3/uL — ABNORMAL HIGH (ref 1.7–7.7)
Neutrophils Relative %: 74 %
Platelets: 412 10*3/uL — ABNORMAL HIGH (ref 150–400)
RBC: 4.64 MIL/uL (ref 3.87–5.11)
RDW: 13.2 % (ref 11.5–15.5)
WBC: 13 10*3/uL — ABNORMAL HIGH (ref 4.0–10.5)
nRBC: 0 % (ref 0.0–0.2)

## 2018-09-20 LAB — COMPREHENSIVE METABOLIC PANEL
ALT: 100 U/L — ABNORMAL HIGH (ref 0–44)
AST: 82 U/L — ABNORMAL HIGH (ref 15–41)
Albumin: 4.5 g/dL (ref 3.5–5.0)
Alkaline Phosphatase: 108 U/L (ref 38–126)
Anion gap: 11 (ref 5–15)
BUN: 15 mg/dL (ref 6–20)
CO2: 23 mmol/L (ref 22–32)
Calcium: 9.4 mg/dL (ref 8.9–10.3)
Chloride: 103 mmol/L (ref 98–111)
Creatinine, Ser: 0.69 mg/dL (ref 0.44–1.00)
GFR calc Af Amer: >60 mL/min (ref >60)
GFR calc non Af Amer: >60 mL/min (ref >60)
Glucose, Bld: 306 mg/dL — ABNORMAL HIGH (ref 70–99)
Potassium: 3.7 mmol/L (ref 3.5–5.1)
Sodium: 137 mmol/L (ref 135–145)
Total Bilirubin: 0.6 mg/dL (ref 0.3–1.2)
Total Protein: 8.1 g/dL (ref 6.5–8.1)

## 2018-09-20 LAB — I-STAT BETA HCG BLOOD, ED (MC, WL, AP ONLY): I-stat hCG, quantitative: <5 m[IU]/mL (ref <5)

## 2018-09-20 LAB — LIPASE, BLOOD: Lipase: 36 U/L (ref 11–51)

## 2018-09-20 MED ORDER — DOXYCYCLINE HYCLATE 100 MG PO CAPS: 100.0000 mg | ORAL_CAPSULE | Freq: Two times a day (BID) | ORAL | 0 refills | Status: DC

## 2018-09-20 MED ORDER — MORPHINE SULFATE (PF) 4 MG/ML IV SOLN
4.0000 mg | Freq: Once | INTRAVENOUS | Status: AC
  Administered 2018-09-20: 14:00:00 4 mg via INTRAVENOUS
  Filled 2018-09-20: qty 1

## 2018-09-20 MED ORDER — LIDOCAINE VISCOUS HCL 2 % MT SOLN
15.0000 mL | Freq: Once | OROMUCOSAL | Status: AC
  Administered 2018-09-20: 15 mL via ORAL
  Filled 2018-09-20: qty 15

## 2018-09-20 MED ORDER — METOCLOPRAMIDE HCL 5 MG/ML IJ SOLN
10.0000 mg | Freq: Once | INTRAMUSCULAR | Status: AC
  Administered 2018-09-20: 10 mg via INTRAVENOUS
  Filled 2018-09-20: qty 2

## 2018-09-20 MED ORDER — SODIUM CHLORIDE 0.9 % IV BOLUS
1000.0000 mL | Freq: Once | INTRAVENOUS | Status: AC
  Administered 2018-09-20: 1000 mL via INTRAVENOUS

## 2018-09-20 MED ORDER — ONDANSETRON HCL 4 MG/2ML IJ SOLN
4.0000 mg | Freq: Once | INTRAMUSCULAR | Status: AC
  Administered 2018-09-20: 12:00:00 4 mg via INTRAVENOUS
  Filled 2018-09-20: qty 2

## 2018-09-20 MED ORDER — ONDANSETRON 4 MG PO TBDP: ORAL_TABLET | ORAL | 0 refills | Status: DC

## 2018-09-20 MED ORDER — DIPHENHYDRAMINE HCL 50 MG/ML IJ SOLN
50.0000 mg | Freq: Once | INTRAMUSCULAR | Status: AC
  Administered 2018-09-20: 13:00:00 50 mg via INTRAVENOUS
  Filled 2018-09-20: qty 1

## 2018-09-20 MED ORDER — MORPHINE SULFATE (PF) 4 MG/ML IV SOLN
8.0000 mg | Freq: Once | INTRAVENOUS | Status: AC
Start: 2018-09-20 — End: 2018-09-20
  Administered 2018-09-20: 12:00:00 8 mg via INTRAVENOUS
  Filled 2018-09-20: qty 2

## 2018-09-20 MED ORDER — MORPHINE SULFATE 15 MG PO TABS: 15.0000 mg | ORAL_TABLET | ORAL | 0 refills | Status: DC | PRN

## 2018-09-20 MED ORDER — ALUM & MAG HYDROXIDE-SIMETH 200-200-20 MG/5ML PO SUSP
30.0000 mL | Freq: Once | ORAL | Status: AC
  Administered 2018-09-20: 13:00:00 30 mL via ORAL
  Filled 2018-09-20: qty 30

## 2018-09-20 MED ORDER — ONDANSETRON 4 MG PO TBDP: ORAL_TABLET | ORAL | 0 refills | Status: AC

## 2018-09-20 NOTE — ED Triage Notes (Signed)
Pt reports had abd pains with diarrhea for several weeks. Today abd pains got severe. Worse with movements and eating. Lost 40 pounds in about 6 months.

## 2018-09-20 NOTE — ED Provider Notes (Addendum)
[facility]-EMERGENCY DEPT Provider Note   CSN: [id number] Arrival date & time: 09/20/18  1141    History   Chief Complaint Chief Complaint  Patient presents with   Abdominal Pain    HPI Loretta Alexander is a 38 y.o. female.     38 yo F with a cc of epigastric abdominal pain. Going on for about four weeks and worsening.  Worse with movement, palpation, deep breathing.  Worse with eating.  N/v/d.  Lost about 40lbs in the last month.  No trauma, no fevers, cough.    Hx of cholecystectomy, R oophorectomy, appendectomy.   The history is provided by the patient.  Abdominal Pain  Pain location:  Epigastric Pain quality: burning, sharp and shooting   Pain radiates to:  Does not radiate Pain severity:  Severe Onset quality:  Gradual Duration:  4 weeks Timing:  Constant Progression:  Worsening Chronicity:  New Worsened by:  Deep breathing and eating Ineffective treatments:  None tried Associated symptoms: diarrhea, nausea and vomiting   Associated symptoms: no chest pain, no chills, no dysuria, no fever and no shortness of breath     Past Medical History:  Diagnosis Date   Adynamic ileus (HCC)    Anxiety    Complication of anesthesia    Difficult IV stick with Central line placement, TACHY 180-190 S/P COLON SURGERY (resolved with treatment for pain)    Depression    Difficult intravenous access    difficult IV stick    Drug-seeking behavior    Dysrhythmia    INCREASED HR AT TIMES   Esophagitis determined by endoscopy    03-2011 EGD   Eye infection    within last 2 weeks   Gastritis 03/2011   Per EGD   GERD (gastroesophageal reflux disease)    OCC TUMS   Intestinal obstruction (HCC)    Kidney stones    Kidney Stones   Migraines    Obesity    Ovarian cyst, right    Hx of oophorectomy for adhesions   Pneumonia    04/2010   Thyroid nodule    Type 2 diabetes mellitus (HCC) 01/03/2017   UTI (lower urinary tract infection)       Patient Active Problem List   Diagnosis Date Noted   Hematemesis with nausea 04/13/2017   Mallory-Weiss tear 04/13/2017   Anaphylaxis 01/03/2017   Type 2 diabetes mellitus (HCC) 01/03/2017   Vaginal discharge 12/18/2015   Adynamic ileus (HCC) 12/17/2015   Dysphagia 05/09/2015   Neck swelling 05/08/2015   S/P cervical spinal fusion 04/28/2015   Migraine headache 01/20/2013   Diplopia 01/20/2013   Left-sided weakness 01/20/2013   Right sided abdominal pain 01/11/2012   Hyperglycemia 01/11/2012   Drug-seeking behavior 01/11/2012   Obesity 01/11/2012   Constipation 01/11/2012   Depression 08/31/2011   Gastritis and gastroduodenitis 04/06/2011   Abdominal pain 04/03/2011   Foreign body 04/03/2011   Nausea with vomiting 04/03/2011    Past Surgical History:  Procedure Laterality Date   ABDOMINAL ADHESION SURGERY  2006   BIL FALLOPIAN TUBES REMOVED   ANTERIOR CERVICAL DECOMP/DISCECTOMY FUSION N/A 04/28/2015   Procedure: Cervical five-six, Cervical six - seven Anterior cervical decompression/diskectomy/fusion;  Surgeon: Tia Alert, MD;  Location: [facility];  Service: Neurosurgery;  Laterality: N/A;  C5-6 C6-7 Anterior cervical decompression/diskectomy/fusion   APPENDECTOMY  1992   CHOLECYSTECTOMY  2003   COLON SURGERY     MAY 2015     BOWEL OBST s/p partial bowel  resection [facility])   ESOPHAGOGASTRODUODENOSCOPY  04/05/2011   Procedure: ESOPHAGOGASTRODUODENOSCOPY (EGD);  Surgeon: Charna Elizabeth, MD;  Location: [facility] ENDOSCOPY;  Service: Endoscopy;  Laterality: N/A;   ESOPHAGOGASTRODUODENOSCOPY N/A 04/13/2017   Procedure: ESOPHAGOGASTRODUODENOSCOPY (EGD);  Surgeon: Rachael Fee, MD;  Location: [facility] ENDOSCOPY;  Service: Endoscopy;  Laterality: N/A;   KNEE ARTHROSCOPY  2001, 2010   left knee x 2   LAPAROSCOPIC GASTRIC BANDING     NASAL SEPTOPLASTY W/ TURBINOPLASTY  03/16/2011   Procedure: NASAL SEPTOPLASTY WITH TURBINATE REDUCTION;  Surgeon:  Susy Frizzle, MD;  Location: [facility] OR;  Service: ENT;  Laterality: Bilateral;   PILONIDAL CYST EXCISION     removal of lap band     right ovary removed  2006   TONSILLECTOMY  1991   UMBILICAL HERNIA REPAIR     UPPER GASTROINTESTINAL ENDOSCOPY       OB History   No obstetric history on file.      Home Medications    Prior to Admission medications   Medication Sig Start Date End Date Taking? Authorizing Provider  amitriptyline (ELAVIL) 25 MG tablet Take 1 tablet (25 mg total) by mouth at bedtime. 04/14/17   Rolly Salter, MD  buPROPion (WELLBUTRIN XL) 300 MG 24 hr tablet Take 300 mg by mouth every morning. 08/26/18   [provider]  colestipol (COLESTID) 1 g tablet Take 1 tablet (1 g total) by mouth 2 (two) times daily. 05/28/17   Armbruster, Willaim Rayas, MD  doxycycline (VIBRAMYCIN) 100 MG capsule Take 1 capsule (100 mg total) by mouth 2 (two) times daily. One po bid x 7 days 09/20/18   Melene Plan, DO  DULoxetine (CYMBALTA) 60 MG capsule Take 60 mg by mouth daily.     [provider]  EPIPEN 2-PAK 0.3 MG/0.3ML SOAJ injection as needed. 01/03/17   [provider]  famotidine (PEPCID) 20 MG tablet Take 1 tablet (20 mg total) by mouth 2 (two) times daily. 01/03/17 03/19/17  Johnson, Clanford L, MD  glimepiride (AMARYL) 1 MG tablet Take 1 tablet (1 mg total) by mouth every morning. Patient not taking: Reported on 05/11/2017 04/14/17 04/14/18  Rolly Salter, MD  levonorgestrel [facility]) 20 MCG/24HR IUD 1 each by Intrauterine route once. 2010    [provider]  metFORMIN (GLUCOPHAGE) 1000 MG tablet Take by mouth.    [provider]  morphine (MSIR) 15 MG tablet Take 1 tablet (15 mg total) by mouth every 4 (four) hours as needed for severe pain. 09/20/18   Melene Plan, DO  NON FORMULARY Take 1 tablet by mouth daily. JUICE PLUS FRUIT AND  VEGGIE FORMULA    [provider]  Nutritional Supplements (JUICE PLUS FIBRE PO) Take 1 tablet by mouth  daily.    [provider]  ondansetron (ZOFRAN ODT) 4 MG disintegrating tablet  ODT q4 hours prn nausea/vomit 09/20/18   Melene Plan, DO  ondansetron (ZOFRAN) 4 MG tablet Take 1 tablet (4 mg total) by mouth every 8 (eight) hours as needed for nausea or vomiting. 04/30/17   Armbruster, Willaim Rayas, MD  pantoprazole (PROTONIX) 40 MG tablet Take 1 tablet (40 mg total) by mouth 2 (two) times daily before a meal. Can increase to twice a day as needed 04/14/17   Rolly Salter, MD  promethazine (PHENERGAN) 12.5 MG tablet Take 1 tablet (12.5 mg total) every 6 (six) hours as needed by mouth for nausea or vomiting. 03/19/17   Armbruster, Willaim Rayas, MD  QUEtiapine (SEROQUEL)  50 MG tablet Take 125 mg by mouth at bedtime.    [provider]  tamsulosin (FLOMAX) 0.4 MG CAPS capsule Take 0.4 mg by mouth daily. 09/02/18   [provider]  tiZANidine (ZANAFLEX) 4 MG tablet Take 1 tablet 3 (three) times daily as needed by mouth. 03/07/17   [provider]    Family History Family History  Problem Relation Age of Onset   Stomach cancer Mother    Esophageal cancer Mother    Colon polyps Mother    Heart attack Father 83   Diabetes Maternal Grandmother    Heart disease Maternal Grandmother    Celiac disease Paternal Aunt    Colitis Paternal Grandmother    Colon cancer Neg Hx    Rectal cancer Neg Hx     Social History Social History   Tobacco Use   Smoking status: Never Smoker   Smokeless tobacco: Never Used  Substance Use Topics   Alcohol use: Yes    Comment: rarely   Drug use: No     Allergies   Iodine; Prochlorperazine maleate; Dilaudid [hydromorphone hcl]; Compazine [prochlorperazine maleate]; Haloperidol and related; Reglan [metoclopramide hcl]; and Toradol [ketorolac tromethamine]   Review of Systems Review of Systems  Constitutional: Negative for chills and fever.  HENT: Negative for congestion and rhinorrhea.   Eyes: Negative for redness  and visual disturbance.  Respiratory: Negative for shortness of breath and wheezing.   Cardiovascular: Negative for chest pain and palpitations.  Gastrointestinal: Positive for abdominal pain, diarrhea, nausea and vomiting.  Genitourinary: Negative for dysuria and urgency.  Musculoskeletal: Negative for arthralgias and myalgias.  Skin: Negative for pallor and wound.  Neurological: Negative for dizziness and headaches.     Physical Exam Updated Vital Signs BP 140/63 (BP Location: Right Arm)    Pulse (!) 119    Temp 99.4 F (37.4 C) (Oral)    Resp 20    SpO2 100%   Physical Exam Vitals signs and nursing note reviewed.  Constitutional:      General: She is not in acute distress.    Appearance: She is well-developed. She is not diaphoretic.  HENT:     Head: Normocephalic and atraumatic.  Eyes:     Pupils: Pupils are equal, round, and reactive to light.  Neck:     Musculoskeletal: Normal range of motion and neck supple.  Cardiovascular:     Rate and Rhythm: Normal rate and regular rhythm.     Heart sounds: No murmur. No friction rub. No gallop.   Pulmonary:     Effort: Pulmonary effort is normal.     Breath sounds: No wheezing or rales.  Abdominal:     General: There is no distension.     Palpations: Abdomen is soft.     Tenderness: There is abdominal tenderness (exquisitely tender to the upper abdomen).  Musculoskeletal:        General: No tenderness.  Skin:    General: Skin is warm and dry.  Neurological:     Mental Status: She is alert and oriented to person, place, and time.  Psychiatric:        Behavior: Behavior normal.      ED Treatments / Results  Labs (all labs ordered are listed, but only abnormal results are displayed) Labs Reviewed  CBC WITH DIFFERENTIAL/PLATELET - Abnormal; Notable for the following components:      Result Value   WBC 13.0 (*)    Platelets 412 (*)    Neutro Abs 9.8 (*)  Abs Immature Granulocytes 0.11 (*)    All other components  within normal limits  COMPREHENSIVE METABOLIC PANEL - Abnormal; Notable for the following components:   Glucose, Bld 306 (*)    AST 82 (*)    ALT 100 (*)    All other components within normal limits  LIPASE, BLOOD  I-STAT BETA HCG BLOOD, ED ([facility], [facility], [facility] ONLY)    EKG EKG Interpretation  Date/Time:  Friday Sep 20 2018 12:38:24 EDT Ventricular Rate:  121 PR Interval:    QRS Duration: 101 QT Interval:  346 QTC Calculation: 491 R Axis:   -23 Text Interpretation:  Sinus tachycardia Borderline left axis deviation Borderline prolonged QT interval No significant change since last tracing Confirmed by Melene Plan [id number]) on 09/20/2018 1:15:32 PM   Radiology Ct Abdomen Pelvis Wo Contrast  Result Date: 09/20/2018 CLINICAL DATA:  Abdominal pain and diarrhea EXAM: CT ABDOMEN AND PELVIS WITHOUT CONTRAST TECHNIQUE: Multidetector CT imaging of the abdomen and pelvis was performed following the standard protocol without oral or IV contrast. COMPARISON:  April 13, 2017 FINDINGS: Lower chest: There are patchy foci of airspace disease in portions of the right upper and right middle lobes. Visualized lungs elsewhere are clear. There is a small hiatal hernia. Hepatobiliary: No focal liver lesions are appreciable on this noncontrast enhanced study. Gallbladder is absent. There is no appreciable biliary duct dilatation. Pancreas: There is no pancreatic mass or inflammatory focus. Spleen: No splenic lesions are evident. Adrenals/Urinary Tract: Adrenals bilaterally appear unremarkable. Kidneys bilaterally show no evident mass or hydronephrosis on either side. There is no evident renal or ureteral calculus on either side. Urinary bladder is midline with wall thickness within normal limits. Stomach/Bowel: Postoperative changes noted in the right colon region with anastomosis patent. There is moderate stool in the colon. There is no bowel dilatation or air-fluid level to suggest bowel obstruction. No evident free air or  portal venous air. The terminal ileum appears unremarkable. Vascular/Lymphatic: There is no abdominal aortic aneurysm. No vascular lesions are evident. There is no adenopathy appreciable in the abdomen or pelvis. Reproductive: The uterus is anteverted. There is an intrauterine device positioned within the endometrium. No extrauterine pelvic mass is evident. There is a small amount of fluid tracking from the right adnexa into the cul-de-sac. Other: The appendix is absent. There is no periappendiceal region inflammation. No abscess or ascites is evident in the abdomen or pelvis. There is scarring in the anterior abdominal wall which is likely of postoperative etiology. Musculoskeletal: There are no blastic or lytic bone lesions. There are small bone islands in each proximal femur and lateral right acetabulum, stable. There is no intramuscular lesion. IMPRESSION: 1. Areas of patchy pneumonia in portions of the right upper and middle lobes. 2. Postoperative change involving the right colon with patent anastomosis. No bowel obstruction. No abscess in the abdomen or pelvis. Appendix absent. No periappendiceal region inflammation. 3. Small amount of fluid tracking from the right adnexa into the cul-de-sac. Suspect recent ovarian cyst rupture. 4. No renal or ureteral calculus evident. No hydronephrosis. Urinary bladder wall thickness is within normal limits. 5.  Gallbladder absent. 6.  Intrauterine device positioned within the endometrium. 7.  Small hiatal hernia. Electronically Signed   By: Bretta Bang III M.D.   On: 09/20/2018 13:41    Procedures Procedures (including critical care time)  Medications Ordered in ED Medications  morphine 4 MG/ML injection 4 mg (has no administration in time range)  morphine 4 MG/ML injection 8 mg (8  mg Intravenous Given 09/20/18 1229)  ondansetron (ZOFRAN) injection 4 mg (4 mg Intravenous Given 09/20/18 1227)  sodium chloride 0.9 % bolus 1,000 mL (1,000 mLs Intravenous New  Bag/Given 09/20/18 1211)  alum & mag hydroxide-simeth (MAALOX/MYLANTA) 200-200-20 MG/5ML suspension 30 mL (30 mLs Oral Given 09/20/18 1232)    And  lidocaine (XYLOCAINE) 2 % viscous mouth solution 15 mL (15 mLs Oral Given 09/20/18 1233)  diphenhydrAMINE (BENADRYL) injection 50 mg (50 mg Intravenous Given 09/20/18 1251)  metoCLOPramide (REGLAN) injection 10 mg (10 mg Intravenous Given 09/20/18 1300)     Initial Impression / Assessment and Plan / ED Course  I have reviewed the triage vital signs and the nursing notes.  Pertinent labs & imaging results that were available during my care of the patient were reviewed by me and considered in my medical decision making (see chart for details).        38 yo F with 4 weeks of epigastric abdominal pain.  Severe pain on my exam, will obtain labs, CT.   Lab work with mild leukocytosis with a neutrophilic predominance.  CT of the abdomen pelvis without obvious intra-abdominal pathology.  Patient has signs of old surgeries has some free fluid in the pelvis which may be due to a recent ruptured cyst that the patient is having no lower abdominal tenderness.  There was pneumonia seen in the right lung.  The patient has had a history of pleurisy which she thinks felt similar in the past.  She denied having cough or fever and with prolonged symptoms that also seems at that unlikely but will start her on treatment with antibiotics.  We will have her follow-up with her family doctor and gastroenterologist.  I have her return for worsening abdominal pain fever or inability to eat or drink.  Of note the patient has a history in her chart of drug-seeking behavior and has had some fairly frequent narcotic prescriptions.  She is still having what appears to be significant pain.  I will do a very short course of pain medicine.  2:24 PM:  I have discussed the diagnosis/risks/treatment options with the patient and believe the pt to be eligible for discharge home to follow-up  with PCP, GI. We also discussed returning to the ED immediately if new or worsening sx occur. We discussed the sx which are most concerning (e.g., sudden worsening pain, fever, inability to tolerate by mouth) that necessitate immediate return. Medications administered to the patient during their visit and any new prescriptions provided to the patient are listed below.  Medications given during this visit Medications  morphine 4 MG/ML injection 4 mg (has no administration in time range)  morphine 4 MG/ML injection 8 mg (8 mg Intravenous Given 09/20/18 1229)  ondansetron (ZOFRAN) injection 4 mg (4 mg Intravenous Given 09/20/18 1227)  sodium chloride 0.9 % bolus 1,000 mL (1,000 mLs Intravenous New Bag/Given 09/20/18 1211)  alum & mag hydroxide-simeth (MAALOX/MYLANTA) 200-200-20 MG/5ML suspension 30 mL (30 mLs Oral Given 09/20/18 1232)    And  lidocaine (XYLOCAINE) 2 % viscous mouth solution 15 mL (15 mLs Oral Given 09/20/18 1233)  diphenhydrAMINE (BENADRYL) injection 50 mg (50 mg Intravenous Given 09/20/18 1251)  metoCLOPramide (REGLAN) injection 10 mg (10 mg Intravenous Given 09/20/18 1300)     The patient appears reasonably screen and/or stabilized for discharge and I doubt any other medical condition or other [facility] requiring further screening, evaluation, or treatment in the ED at this time prior to discharge.    Final Clinical  Impressions(s) / ED Diagnoses   Final diagnoses:  Community acquired pneumonia of right lower lobe of lung [facility](HCC)    ED Discharge Orders         Ordered    doxycycline (VIBRAMYCIN) 100 MG capsule  2 times daily     09/20/18 1420    morphine (MSIR) 15 MG tablet  Every 4 hours PRN     09/20/18 1421    ondansetron (ZOFRAN ODT) 4 MG disintegrating tablet     09/20/18 1421           Melene PlanFloyd, Kurtiss Wence, DO 09/20/18 1424    Melene PlanFloyd, Bucky Grigg, DO 09/20/18 1440

## 2018-09-20 NOTE — TOC Initial Note (Signed)
Transition of Care [facility]) - Initial/Assessment Note    Patient Details  Name: Loretta Alexander MRN: [id number] Date of Birth: 22-Jul-1980  Transition of Care [facility]) CM/SW Contact:    Elliot Cousin, RN Phone Number: 09/20/2018, 2:35 PM  Clinical Narrative:    [facility] ED TOC CM -referral no insurance              Spoke to pt and states she has insurance. Meds were sent to [facility] pharmacy. Pharmacy is closed and updated [facility] [place]. Made attending aware. She will call and give update on her insurance when she gets home.   Expected Discharge Plan: Home/Self Care Barriers to Discharge: No Barriers Identified   Patient Goals and CMS Choice        Expected Discharge Plan and Services Expected Discharge Plan: Home/Self Care   Discharge Planning Services: CM Consult, Medication Assistance                                          Prior Living Arrangements/Services     Patient language and need for interpreter reviewed:: Yes Do you feel safe going back to the place where you live?: Yes      Need for Family Participation in Patient Care: No (Comment) Care giver support system in place?: No (comment)   Criminal Activity/Legal Involvement Pertinent to Current Situation/Hospitalization: No - Comment as needed  Activities of Daily Living      Permission Sought/Granted                  Emotional Assessment       Orientation: : Oriented to Self, Oriented to Place, Oriented to  Time, Oriented to Situation      Admission diagnosis:  upper abd pain Patient Active Problem List   Diagnosis Date Noted  . Hematemesis with nausea 04/13/2017  . Mallory-Weiss tear 04/13/2017  . Anaphylaxis 01/03/2017  . Type 2 diabetes mellitus (HCC) 01/03/2017  . Vaginal discharge 12/18/2015  . Adynamic ileus (HCC) 12/17/2015  . Dysphagia 05/09/2015  . Neck swelling 05/08/2015  . S/P cervical spinal fusion 04/28/2015  . Migraine headache  01/20/2013  . Diplopia 01/20/2013  . Left-sided weakness 01/20/2013  . Right sided abdominal pain 01/11/2012  . Hyperglycemia 01/11/2012  . Drug-seeking behavior 01/11/2012  . Obesity 01/11/2012  . Constipation 01/11/2012  . Depression 08/31/2011  . Gastritis and gastroduodenitis 04/06/2011  . Abdominal pain 04/03/2011  . Foreign body 04/03/2011  . Nausea with vomiting 04/03/2011   PCP:  Wilfrid Lund, PA Pharmacy:   [facility] [address] [place] [address] [place] [address] Phone: [phone] Fax: [phone]  [id number] [place] [place] - [address] [place] [address] Phone: [phone] Fax: [phone]     Social Determinants of Health (SDOH) Interventions    Readmission Risk Interventions No flowsheet data found.

## 2018-09-20 NOTE — Discharge Instructions (Addendum)
Return for fever, inability to eat or drink, sudden worsening pain.   Take 4 over the counter ibuprofen tablets 3 times a day or 2 over-the-counter naproxen tablets twice a day for pain. Also take tylenol 1000mg (2 extra strength) four times a day.   Then take the pain medicine if you feel like you need it. Narcotics do not help with the pain, they only make you care about it less.  You can become addicted to this, people may break into your house to steal it.  It will constipate you.  If you drive under the influence of this medicine you can get a DUI.

## 2018-09-20 NOTE — ED Notes (Signed)
Patient transported to CT 

## 2018-09-23 ENCOUNTER — Telehealth: Payer: Self-pay

## 2018-09-23 NOTE — Telephone Encounter (Signed)
Left message for patient to call back reference scheduling a clinic visit with Dr. Adela Lank.

## 2018-09-24 ENCOUNTER — Telehealth: Payer: Self-pay

## 2018-09-24 NOTE — Progress Notes (Unsigned)
Dear Loretta Alexander:   Dr. Adela Lank would like to schedule an office visit with you to follow-up from your recent visit to the ED on 09/20/18. We have not been able to reach you by phone, and it looks like your My Chart is not a communication tool you have been using. If you could please call us at [phone] and let us know how you are doing and hopefully be able to schedule a visit that would be great.   Sincerely,  [facility]

## 2018-09-24 NOTE — Telephone Encounter (Signed)
Have tried multiple times to contact patient reference scheduling clinic visit in follow-up form ED. Patient does not use he My Chart. Sent letter requesting patient call us

## 2018-09-24 NOTE — Telephone Encounter (Signed)
Okay thanks very much Esther 

## 2019-01-30 ENCOUNTER — Emergency Department (HOSPITAL_COMMUNITY): Payer: BLUE CROSS/BLUE SHIELD

## 2019-01-30 ENCOUNTER — Emergency Department (HOSPITAL_COMMUNITY)
Admission: EM | Admit: 2019-01-30 | Discharge: 2019-01-30 | Disposition: A | Discharge status: Home / Self Care | Payer: BLUE CROSS/BLUE SHIELD | Attending: Emergency Medicine | Admitting: Emergency Medicine

## 2019-01-30 DIAGNOSIS — Z20828 Contact with and (suspected) exposure to other viral communicable diseases: Secondary | ICD-10-CM | POA: Diagnosis not present

## 2019-01-30 DIAGNOSIS — R0789 Other chest pain: Secondary | ICD-10-CM | POA: Diagnosis present

## 2019-01-30 DIAGNOSIS — E119 Type 2 diabetes mellitus without complications: Secondary | ICD-10-CM | POA: Insufficient documentation

## 2019-01-30 DIAGNOSIS — Z8241 Family history of sudden cardiac death: Secondary | ICD-10-CM | POA: Diagnosis not present

## 2019-01-30 DIAGNOSIS — Z79899 Other long term (current) drug therapy: Secondary | ICD-10-CM | POA: Insufficient documentation

## 2019-01-30 DIAGNOSIS — Z7984 Long term (current) use of oral hypoglycemic drugs: Secondary | ICD-10-CM | POA: Diagnosis not present

## 2019-01-30 DIAGNOSIS — R079 Chest pain, unspecified: Secondary | ICD-10-CM

## 2019-01-30 LAB — HEPATIC FUNCTION PANEL
ALT: 18 U/L (ref 0–44)
AST: 22 U/L (ref 15–41)
Albumin: 3.5 g/dL (ref 3.5–5.0)
Alkaline Phosphatase: 80 U/L (ref 38–126)
Bilirubin, Direct: 0.2 mg/dL (ref 0.0–0.2)
Indirect Bilirubin: 0.6 mg/dL (ref 0.3–0.9)
Total Bilirubin: 0.8 mg/dL (ref 0.3–1.2)
Total Protein: 6.2 g/dL — ABNORMAL LOW (ref 6.5–8.1)

## 2019-01-30 LAB — BASIC METABOLIC PANEL
Anion gap: 9 (ref 5–15)
BUN: 9 mg/dL (ref 6–20)
CO2: 27 mmol/L (ref 22–32)
Calcium: 9.2 mg/dL (ref 8.9–10.3)
Chloride: 100 mmol/L (ref 98–111)
Creatinine, Ser: 0.58 mg/dL (ref 0.44–1.00)
GFR calc Af Amer: >60 mL/min (ref >60)
GFR calc non Af Amer: >60 mL/min (ref >60)
Glucose, Bld: 266 mg/dL — ABNORMAL HIGH (ref 70–99)
Potassium: 3.8 mmol/L (ref 3.5–5.1)
Sodium: 136 mmol/L (ref 135–145)

## 2019-01-30 LAB — CBC
HCT: 41.2 % (ref 36.0–46.0)
Hemoglobin: 13.6 g/dL (ref 12.0–15.0)
MCH: 29.8 pg (ref 26.0–34.0)
MCHC: 33 g/dL (ref 30.0–36.0)
MCV: 90.2 fL (ref 80.0–100.0)
Platelets: 450 10*3/uL — ABNORMAL HIGH (ref 150–400)
RBC: 4.57 MIL/uL (ref 3.87–5.11)
RDW: 12.2 % (ref 11.5–15.5)
WBC: 8.8 10*3/uL (ref 4.0–10.5)
nRBC: 0 % (ref 0.0–0.2)

## 2019-01-30 LAB — SARS CORONAVIRUS 2 BY RT PCR (HOSPITAL ORDER, PERFORMED IN ~~LOC~~ HOSPITAL LAB): SARS Coronavirus 2: NEGATIVE

## 2019-01-30 LAB — TROPONIN I (HIGH SENSITIVITY)
Troponin I (High Sensitivity): <2 ng/L (ref <18)
Troponin I (High Sensitivity): 2 ng/L (ref <18)

## 2019-01-30 LAB — LIPASE, BLOOD: Lipase: 19 U/L (ref 11–51)

## 2019-01-30 MED ORDER — LIDOCAINE VISCOUS HCL 2 % MT SOLN
15.0000 mL | Freq: Once | OROMUCOSAL | Status: AC
  Administered 2019-01-30: 14:00:00 15 mL via ORAL
  Filled 2019-01-30: qty 15

## 2019-01-30 MED ORDER — ONDANSETRON HCL 4 MG/2ML IJ SOLN
4.0000 mg | Freq: Once | INTRAMUSCULAR | Status: AC
  Administered 2019-01-30: 15:00:00 4 mg via INTRAVENOUS
  Filled 2019-01-30: qty 2

## 2019-01-30 MED ORDER — DIPHENHYDRAMINE HCL 50 MG/ML IJ SOLN
25.0000 mg | Freq: Once | INTRAMUSCULAR | Status: AC
  Administered 2019-01-30: 15:00:00 25 mg via INTRAVENOUS
  Filled 2019-01-30: qty 1

## 2019-01-30 MED ORDER — ALUM & MAG HYDROXIDE-SIMETH 200-200-20 MG/5ML PO SUSP
30.0000 mL | Freq: Once | ORAL | Status: AC
  Administered 2019-01-30: 14:00:00 30 mL via ORAL
  Filled 2019-01-30: qty 30

## 2019-01-30 MED ORDER — MORPHINE SULFATE (PF) 4 MG/ML IV SOLN
4.0000 mg | Freq: Once | INTRAVENOUS | Status: AC
  Administered 2019-01-30: 15:00:00 4 mg via INTRAVENOUS
  Filled 2019-01-30: qty 1

## 2019-01-30 MED ORDER — HYDROXYZINE HCL 25 MG PO TABS
25.0000 mg | ORAL_TABLET | Freq: Once | ORAL | Status: AC
  Administered 2019-01-30: 25 mg via ORAL
  Filled 2019-01-30: qty 1

## 2019-01-30 MED ORDER — MORPHINE SULFATE (PF) 4 MG/ML IV SOLN
4.0000 mg | Freq: Once | INTRAVENOUS | Status: AC
  Administered 2019-01-30: 4 mg via INTRAVENOUS
  Filled 2019-01-30: qty 1

## 2019-01-30 NOTE — ED Provider Notes (Signed)
[facility] EMERGENCY DEPARTMENT Provider Note   CSN: [id number] Arrival date & time: 01/30/19  1203     History   Chief Complaint Chief Complaint  Patient presents with  . Chest Pain    HPI Loretta Alexander is a 38 y.o. female.     The history is provided by the patient and medical records. No language interpreter was used.  Chest Pain    38 year old female with history of drug-seeking behavior, anxiety, gastritis, presenting for evaluation of chest pain.  Patient report she has been undergoing quite bit of stress with recent passing of family members and her young child diagnosed with cancer.  Today while at work she developed pain to the left side of her chest.  She described as a pressure sensation radiates to the left side of her neck and down her left arm which she felt nauseous, dizzy, breakout into a sweats and felt shortness of breath.  Symptom has been ongoing for the past 2 hours, patient was given aspirin and sublingual nitro with minimal improvement.  Patient states she has never had this kind of pain before and mention.  Dad passed away from a massive MI at the age of 63 which is the same age that she is now.  She also endorsed having heartburn sensation earlier today.  She denies having fever, productive cough, hemoptysis, leg swelling or calf pain.  She has never had cardiac work-up in the past.  She currently rates the pain is 8 out of 10.  Past Medical History:  Diagnosis Date  . Adynamic ileus (HCC)   . Anxiety   . Complication of anesthesia    Difficult IV stick with Central line placement, TACHY 180-190 S/P COLON SURGERY (resolved with treatment for pain)   . Depression   . Difficult intravenous access    difficult IV stick   . Drug-seeking behavior   . Dysrhythmia    INCREASED HR AT TIMES  . Esophagitis determined by endoscopy    03-2011 EGD  . Eye infection    within last 2 weeks  . Gastritis 03/2011   Per EGD  . GERD (gastroesophageal  reflux disease)    OCC TUMS  . Intestinal obstruction (HCC)   . Kidney stones    Kidney Stones  . Migraines   . Obesity   . Ovarian cyst, right    Hx of oophorectomy for adhesions  . Pneumonia    04/2010  . Thyroid nodule   . Type 2 diabetes mellitus (HCC) 01/03/2017  . UTI (lower urinary tract infection)     Patient Active Problem List   Diagnosis Date Noted  . Hematemesis with nausea 04/13/2017  . Mallory-Weiss tear 04/13/2017  . Anaphylaxis 01/03/2017  . Type 2 diabetes mellitus (HCC) 01/03/2017  . Vaginal discharge 12/18/2015  . Adynamic ileus (HCC) 12/17/2015  . Dysphagia 05/09/2015  . Neck swelling 05/08/2015  . S/P cervical spinal fusion 04/28/2015  . Migraine headache 01/20/2013  . Diplopia 01/20/2013  . Left-sided weakness 01/20/2013  . Right sided abdominal pain 01/11/2012  . Hyperglycemia 01/11/2012  . Drug-seeking behavior 01/11/2012  . Obesity 01/11/2012  . Constipation 01/11/2012  . Depression 08/31/2011  . Gastritis and gastroduodenitis 04/06/2011  . Abdominal pain 04/03/2011  . Foreign body 04/03/2011  . Nausea with vomiting 04/03/2011    Past Surgical History:  Procedure Laterality Date  . ABDOMINAL ADHESION SURGERY  2006   BIL FALLOPIAN TUBES REMOVED  . ANTERIOR CERVICAL DECOMP/DISCECTOMY FUSION N/A 04/28/2015  Procedure: Cervical five-six, Cervical six - seven Anterior cervical decompression/diskectomy/fusion;  Surgeon: Tia Alert, MD;  Location: [facility] NEURO ORS;  Service: Neurosurgery;  Laterality: N/A;  C5-6 C6-7 Anterior cervical decompression/diskectomy/fusion  . APPENDECTOMY  1992  . CHOLECYSTECTOMY  2003  . COLON SURGERY     MAY 2015     BOWEL OBST s/p partial bowel resection [facility])  . ESOPHAGOGASTRODUODENOSCOPY  04/05/2011   Procedure: ESOPHAGOGASTRODUODENOSCOPY (EGD);  Surgeon: Charna Elizabeth, MD;  Location: [facility] ENDOSCOPY;  Service: Endoscopy;  Laterality: N/A;  . ESOPHAGOGASTRODUODENOSCOPY N/A 04/13/2017   Procedure:  ESOPHAGOGASTRODUODENOSCOPY (EGD);  Surgeon: Rachael Fee, MD;  Location: [facility] ENDOSCOPY;  Service: Endoscopy;  Laterality: N/A;  . KNEE ARTHROSCOPY  2001, 2010   left knee x 2  . LAPAROSCOPIC GASTRIC BANDING    . NASAL SEPTOPLASTY W/ TURBINOPLASTY  03/16/2011   Procedure: NASAL SEPTOPLASTY WITH TURBINATE REDUCTION;  Surgeon: Susy Frizzle, MD;  Location: [facility] OR;  Service: ENT;  Laterality: Bilateral;  . PILONIDAL CYST EXCISION    . removal of lap band    . right ovary removed  2006  . TONSILLECTOMY  1991  . UMBILICAL HERNIA REPAIR    . UPPER GASTROINTESTINAL ENDOSCOPY       OB History   No obstetric history on file.      Home Medications    Prior to Admission medications   Medication Sig Start Date End Date Taking? Authorizing Provider  amitriptyline (ELAVIL) 25 MG tablet Take 1 tablet (25 mg total) by mouth at bedtime. 04/14/17   Rolly Salter, MD  buPROPion (WELLBUTRIN XL) 300 MG 24 hr tablet Take 300 mg by mouth every morning. 08/26/18   [provider]  colestipol (COLESTID) 1 g tablet Take 1 tablet (1 g total) by mouth 2 (two) times daily. 05/28/17   Armbruster, Willaim Rayas, MD  doxycycline (VIBRAMYCIN) 100 MG capsule Take 1 capsule (100 mg total) by mouth 2 (two) times daily. One po bid x 7 days 09/20/18   Melene Plan, DO  DULoxetine (CYMBALTA) 60 MG capsule Take 60 mg by mouth daily.     [provider]  EPIPEN 2-PAK 0.3 MG/0.3ML SOAJ injection as needed. 01/03/17   [provider]  famotidine (PEPCID) 20 MG tablet Take 1 tablet (20 mg total) by mouth 2 (two) times daily. 01/03/17 03/19/17  Johnson, Clanford L, MD  glimepiride (AMARYL) 1 MG tablet Take 1 tablet (1 mg total) by mouth every morning. Patient not taking: Reported on 05/11/2017 04/14/17 04/14/18  Rolly Salter, MD  levonorgestrel [facility]) 20 MCG/24HR IUD 1 each by Intrauterine route once. 2010    [provider]  metFORMIN (GLUCOPHAGE) 1000 MG tablet Take by mouth.    [provider]  morphine (MSIR) 15 MG tablet Take 1 tablet (15 mg total) by mouth every 4 (four) hours as needed for severe pain. 09/20/18   Melene Plan, DO  NON FORMULARY Take 1 tablet by mouth daily. JUICE PLUS FRUIT AND  VEGGIE FORMULA    [provider]  Nutritional Supplements (JUICE PLUS FIBRE PO) Take 1 tablet by mouth daily.    [provider]  ondansetron (ZOFRAN ODT) 4 MG disintegrating tablet 4mg  ODT q4 hours prn nausea/vomit 09/20/18   09/22/18, DO  ondansetron (ZOFRAN) 4 MG tablet Take 1 tablet (4 mg total) by mouth every 8 (eight) hours as needed for nausea or vomiting. 04/30/17   Armbruster, 05/02/17, MD  pantoprazole (PROTONIX) 40 MG tablet Take 1  tablet (40 mg total) by mouth 2 (two) times daily before a meal. Can increase to twice a day as needed 04/14/17   Rolly SalterPatel, Pranav M, MD  promethazine (PHENERGAN) 12.5 MG tablet Take 1 tablet (12.5 mg total) every 6 (six) hours as needed by mouth for nausea or vomiting. 03/19/17   Armbruster, Willaim RayasSteven P, MD  QUEtiapine (SEROQUEL) 50 MG tablet Take 125 mg by mouth at bedtime.    [provider]  tamsulosin (FLOMAX) 0.4 MG CAPS capsule Take 0.4 mg by mouth daily. 09/02/18   [provider]  tiZANidine (ZANAFLEX) 4 MG tablet Take 1 tablet 3 (three) times daily as needed by mouth. 03/07/17   [provider]    Family History Family History  Problem Relation Age of Onset  . Stomach cancer Mother   . Esophageal cancer Mother   . Colon polyps Mother   . Heart attack Father [id number]  . Diabetes Maternal Grandmother   . Heart disease Maternal Grandmother   . Celiac disease Paternal Aunt   . Colitis Paternal Grandmother   . Colon cancer Neg Hx   . Rectal cancer Neg Hx     Social History Social History   Tobacco Use  . Smoking status: Never Smoker  . Smokeless tobacco: Never Used  Substance Use Topics  . Alcohol use: Yes    Comment: rarely  . Drug use: No     Allergies   Iodine, Prochlorperazine  maleate, Dilaudid [hydromorphone hcl], Compazine [prochlorperazine maleate], Haloperidol and related, Reglan [metoclopramide hcl], and Toradol [ketorolac tromethamine]   Review of Systems Review of Systems  Cardiovascular: Positive for chest pain.  All other systems reviewed and are negative.    Physical Exam Updated Vital Signs BP 139/88 (BP Location: Right Arm)   Pulse (!) 115   Resp 16   SpO2 100%   Physical Exam Vitals signs and nursing note reviewed.  Constitutional:      General: She is not in acute distress.    Appearance: She is well-developed. She is obese.  HENT:     Head: Atraumatic.  Eyes:     Conjunctiva/sclera: Conjunctivae normal.  Neck:     Musculoskeletal: Neck supple.  Cardiovascular:     Rate and Rhythm: Tachycardia present.  Pulmonary:     Effort: Pulmonary effort is normal.     Breath sounds: Normal breath sounds. No wheezing, rhonchi or rales.  Abdominal:     Palpations: Abdomen is soft.     Tenderness: There is abdominal tenderness (epigastric tenderness, no guarding or rebound. no abdominal bruits).  Musculoskeletal:        General: No swelling.  Skin:    Capillary Refill: Capillary refill takes less than 2 seconds.     Findings: No rash.  Neurological:     Mental Status: She is alert and oriented to person, place, and time.  Psychiatric:        Mood and Affect: Mood normal.      ED Treatments / Results  Labs (all labs ordered are listed, but only abnormal results are displayed) Labs Reviewed  BASIC METABOLIC PANEL  CBC  POC URINE PREG, ED  TROPONIN I (HIGH SENSITIVITY)    EKG EKG Interpretation  Date/Time:  Thursday January 30 2019 12:33:55 EDT Ventricular Rate:  104 PR Interval:    QRS Duration: 105 QT Interval:  366 QTC Calculation: 482 R Axis:   -10 Text Interpretation:  Sinus tachycardia RSR' in V1 or V2, right VCD or RVH Consider anterior infarct  Confirmed by Pattricia Boss [phone]) on 01/30/2019 3:53:47 PM   ED ECG  REPORT   Date: 01/30/2019  Rate: 104  Rhythm: sinus tachycardia  QRS Axis: left  Intervals: normal  ST/T Wave abnormalities: nonspecific ST changes  Conduction Disutrbances:none  Narrative Interpretation:   Old EKG Reviewed: unchanged  I have personally reviewed the EKG tracing and agree with the computerized printout as noted.   Radiology Dg Chest Port 1 View  Result Date: 01/30/2019 CLINICAL DATA:  Sudden onset chest pain EXAM: PORTABLE CHEST 1 VIEW COMPARISON:  01/03/2017 FINDINGS: The heart size and mediastinal contours are within normal limits. Both lungs are clear. The visualized skeletal structures are unremarkable. IMPRESSION: No active disease. Electronically Signed   By: Kathreen Devoid   On: 01/30/2019 13:02    Procedures Procedures (including critical care time)  Medications Ordered in ED Medications - No data to display   Initial Impression / Assessment and Plan / ED Course  I have reviewed the triage vital signs and the nursing notes.  Pertinent labs & imaging results that were available during my care of the patient were reviewed by me and considered in my medical decision making (see chart for details).        BP 139/88 (BP Location: Right Arm)   Pulse (!) 115   Resp 16   SpO2 100%    Final Clinical Impressions(s) / ED Diagnoses   Final diagnoses:  None    ED Discharge Orders    None     12:31 PM Patient with family history of premature cardiac death here with left-sided chest pain.  Work-up initiated. HEART score of 4, will need 2 troponin to r/o MACE.  Pt is tachycardic but no hypoxia and denies active SOB.  Doubt PE  3:58 PM I discussed care with Dr. Jeanell Sparrow who evaluated pt and recommend checking lipase as well as obtaining chest/abd/pelvis CTA to r/o dissection given moderate upper abd pain which radiates to her back.  She has prior cholecystectomy.    5:06 PM Pt sign out to oncoming provider.  Will f/u on chest/abd/pelvis CT and delta trop.   If negative, anticipate discharge home with outpt f/u.     Domenic Moras, PA-C 01/30/19 1706    Pattricia Boss, MD 02/04/19 1247

## 2019-01-30 NOTE — Discharge Instructions (Signed)
Please follow-up closely with your primary care provider regarding your visit today. Your COVID test is negative. You may have a viral upper respiratory infection, treat your symptoms with over-the-counter medications as needed. You can also trial some antacids to help with your upper abdominal pain. If you have any worsening of your symptoms, please return to the emergency department for further evaluation.

## 2019-01-30 NOTE — ED Notes (Signed)
Patient Alert and oriented to baseline. Stable and ambulatory to baseline. Patient verbalized understanding of the discharge instructions.  Patient belongings were taken by the patient.   

## 2019-01-30 NOTE — ED Provider Notes (Signed)
Care assumed at shift change from [place], [place]. See his note for full HPI and workup. Briefly, pt presenting with left-sided CP while on elevator today. She had assoc diaphoresis, nausea and radiating to back. Heart score 4. Given full dose of ASA and 1 of NTG. Pending dissection study and repeat trop. If both neg, anticipate d/c.  Physical Exam  BP 130/80 (BP Location: Left Arm)   Pulse 92   Temp 100.2 F (37.9 C) (Oral)   Resp 20   SpO2 100%   Physical Exam Vitals signs and nursing note reviewed.  Constitutional:      General: She is not in acute distress.    Appearance: She is well-developed. She is not ill-appearing.  HENT:     Head: Normocephalic and atraumatic.  Eyes:     Conjunctiva/sclera: Conjunctivae normal.  Cardiovascular:     Rate and Rhythm: Normal rate and regular rhythm.  Pulmonary:     Effort: Pulmonary effort is normal. No respiratory distress.  Abdominal:     Palpations: Abdomen is soft.  Skin:    General: Skin is warm.  Neurological:     Mental Status: She is alert.  Psychiatric:        Mood and Affect: Mood normal.        Behavior: Behavior normal.     ED Course/Procedures     Procedures Results for orders placed or performed during the hospital encounter of 01/30/19  SARS Coronavirus 2 [facility] order, Performed in [facility] hospital lab) Nasopharyngeal Nasopharyngeal Swab   Specimen: Nasopharyngeal Swab  Result Value Ref Range   SARS Coronavirus 2 NEGATIVE NEGATIVE  Basic metabolic panel  Result Value Ref Range   Sodium 136 135 - 145 mmol/L   Potassium 3.8 3.5 - 5.1 mmol/L   Chloride 100 98 - 111 mmol/L   CO2 27 22 - 32 mmol/L   Glucose, Bld 266 (H) 70 - 99 mg/dL   BUN 9 6 - 20 mg/dL   Creatinine, Ser 0.58 0.44 - 1.00 mg/dL   Calcium 9.2 8.9 - 10.3 mg/dL   GFR calc non Af Amer >60 >60 mL/min   GFR calc Af Amer >60 >60 mL/min   Anion gap 9 5 - 15  CBC  Result Value Ref Range   WBC 8.8 4.0 - 10.5 K/uL   RBC 4.57 3.87 - 5.11 MIL/uL   Hemoglobin 13.6 12.0 - 15.0 g/dL   HCT 41.2 36.0 - 46.0 %   MCV 90.2 80.0 - 100.0 fL   MCH 29.8 26.0 - 34.0 pg   MCHC 33.0 30.0 - 36.0 g/dL   RDW 12.2 11.5 - 15.5 %   Platelets 450 (H) 150 - 400 K/uL   nRBC 0.0 0.0 - 0.2 %  Lipase, blood  Result Value Ref Range   Lipase 19 11 - 51 U/L  Hepatic function panel  Result Value Ref Range   Total Protein 6.2 (L) 6.5 - 8.1 g/dL   Albumin 3.5 3.5 - 5.0 g/dL   AST 22 15 - 41 U/L   ALT 18 0 - 44 U/L   Alkaline Phosphatase 80 38 - 126 U/L   Total Bilirubin 0.8 0.3 - 1.2 mg/dL   Bilirubin, Direct 0.2 0.0 - 0.2 mg/dL   Indirect Bilirubin 0.6 0.3 - 0.9 mg/dL  Troponin I (High Sensitivity)  Result Value Ref Range   Troponin I (High Sensitivity) <2 <18 ng/L  Troponin I (High Sensitivity)  Result Value Ref Range   Troponin I (High Sensitivity)  2 <18 ng/L   Dg Chest Port 1 View  Result Date: 01/30/2019 CLINICAL DATA:  Sudden onset chest pain EXAM: PORTABLE CHEST 1 VIEW COMPARISON:  01/03/2017 FINDINGS: The heart size and mediastinal contours are within normal limits. Both lungs are clear. The visualized skeletal structures are unremarkable. IMPRESSION: No active disease. Electronically Signed   By: Elige Ko   On: 01/30/2019 13:02    MDM  Pt presenting with CP that began prior to arrival in the absence of exertion. Heart score 4. Initial trop neg. CXr neg. She was pending CT dissection study given pts assoc abd pain radiating to her back. However, pt states she does not want any additional radiation as she has had multiple scans in the past. Symptoms managed per interventions given by previous provider. PT is well-appearing, in no distress. 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 2nd troponin is wnl. Added on hepatic function panel, also reassuring. Believe it is reasonable for discharge per patient's request, pt seems reliable for follow up, is an ED nurse.  Strict return precautions and close PCP follow up. Discussed with Dr. Rodena Medin, who is agreeable to plan.       , [place] N, PA-C 01/30/19 2022    Margarita Grizzle, MD 02/04/19 1247

## 2019-01-30 NOTE — ED Triage Notes (Signed)
Pt from work where pt experienced sudden onset cp while walking down stairs. 324mg  ASA and 1xSL NTG given PTA. EMS EKG unremarkable, per EMS. Pt had recent death in family and has hx of depression and anxiety. Father passed at age of 28 from MI. PT is alert and oriented x4 at time of triage.

## 2019-01-30 NOTE — ED Notes (Signed)
ED-P made aware of temp 

## 2019-08-07 DEATH — deceased
